# Patient Record
Sex: Male | Born: 1950
Health system: Southern US, Community
[De-identification: ages and names within clinical notes are randomized; demographics above are authoritative.]

## PROBLEM LIST (undated history)

## (undated) DIAGNOSIS — Z973 Presence of spectacles and contact lenses: Secondary | ICD-10-CM

## (undated) DIAGNOSIS — H9190 Unspecified hearing loss, unspecified ear: Secondary | ICD-10-CM

## (undated) DIAGNOSIS — J45909 Unspecified asthma, uncomplicated: Secondary | ICD-10-CM

## (undated) DIAGNOSIS — IMO0001 Reserved for inherently not codable concepts without codable children: Secondary | ICD-10-CM

## (undated) DIAGNOSIS — Z8719 Personal history of other diseases of the digestive system: Secondary | ICD-10-CM

## (undated) DIAGNOSIS — R351 Nocturia: Secondary | ICD-10-CM

## (undated) DIAGNOSIS — Z8546 Personal history of malignant neoplasm of prostate: Secondary | ICD-10-CM

## (undated) DIAGNOSIS — F329 Major depressive disorder, single episode, unspecified: Secondary | ICD-10-CM

## (undated) DIAGNOSIS — Z86010 Personal history of colonic polyps: Secondary | ICD-10-CM

## (undated) DIAGNOSIS — F4310 Post-traumatic stress disorder, unspecified: Secondary | ICD-10-CM

## (undated) DIAGNOSIS — K219 Gastro-esophageal reflux disease without esophagitis: Secondary | ICD-10-CM

## (undated) DIAGNOSIS — N400 Enlarged prostate without lower urinary tract symptoms: Secondary | ICD-10-CM

## (undated) DIAGNOSIS — Z860101 Personal history of adenomatous and serrated colon polyps: Secondary | ICD-10-CM

## (undated) DIAGNOSIS — Z77098 Contact with and (suspected) exposure to other hazardous, chiefly nonmedicinal, chemicals: Secondary | ICD-10-CM

## (undated) DIAGNOSIS — M1990 Unspecified osteoarthritis, unspecified site: Secondary | ICD-10-CM

## (undated) DIAGNOSIS — I480 Paroxysmal atrial fibrillation: Secondary | ICD-10-CM

## (undated) DIAGNOSIS — Z8711 Personal history of peptic ulcer disease: Secondary | ICD-10-CM

## (undated) DIAGNOSIS — E785 Hyperlipidemia, unspecified: Secondary | ICD-10-CM

## (undated) DIAGNOSIS — E119 Type 2 diabetes mellitus without complications: Secondary | ICD-10-CM

## (undated) DIAGNOSIS — Z9989 Dependence on other enabling machines and devices: Secondary | ICD-10-CM

## (undated) DIAGNOSIS — F32A Depression, unspecified: Secondary | ICD-10-CM

## (undated) DIAGNOSIS — G4733 Obstructive sleep apnea (adult) (pediatric): Secondary | ICD-10-CM

## (undated) DIAGNOSIS — I10 Essential (primary) hypertension: Secondary | ICD-10-CM

## (undated) DIAGNOSIS — G8929 Other chronic pain: Secondary | ICD-10-CM

## (undated) DIAGNOSIS — D649 Anemia, unspecified: Secondary | ICD-10-CM

## (undated) DIAGNOSIS — N529 Male erectile dysfunction, unspecified: Secondary | ICD-10-CM

## (undated) DIAGNOSIS — H5461 Unqualified visual loss, right eye, normal vision left eye: Secondary | ICD-10-CM

## (undated) DIAGNOSIS — E1142 Type 2 diabetes mellitus with diabetic polyneuropathy: Secondary | ICD-10-CM

## (undated) DIAGNOSIS — Z7739 Contact with and (suspected) exposure to other war theater: Secondary | ICD-10-CM

## (undated) DIAGNOSIS — N5089 Other specified disorders of the male genital organs: Secondary | ICD-10-CM

## (undated) DIAGNOSIS — Z87898 Personal history of other specified conditions: Secondary | ICD-10-CM

## (undated) DIAGNOSIS — I509 Heart failure, unspecified: Secondary | ICD-10-CM

## (undated) HISTORY — DX: Male erectile dysfunction, unspecified: N52.9

## (undated) HISTORY — DX: Essential (primary) hypertension: I10

## (undated) HISTORY — DX: Benign prostatic hyperplasia without lower urinary tract symptoms: N40.0

## (undated) HISTORY — PX: CATARACT EXTRACTION: SUR2

## (undated) HISTORY — DX: Post-traumatic stress disorder, unspecified: F43.10

## (undated) HISTORY — PX: OTHER SURGICAL HISTORY: SHX169

---

## 1985-07-26 HISTORY — PX: OTHER SURGICAL HISTORY: SHX169

## 1996-07-26 HISTORY — PX: TOTAL KNEE ARTHROPLASTY: SHX125

## 2000-08-09 ENCOUNTER — Encounter: Payer: Self-pay | Admitting: Internal Medicine

## 2002-05-26 ENCOUNTER — Emergency Department (HOSPITAL_COMMUNITY): Admission: EM | Admit: 2002-05-26 | Discharge: 2002-05-26 | Payer: Self-pay | Admitting: Emergency Medicine

## 2003-07-12 ENCOUNTER — Emergency Department (HOSPITAL_COMMUNITY): Admission: EM | Admit: 2003-07-12 | Discharge: 2003-07-12 | Payer: Self-pay

## 2003-11-06 ENCOUNTER — Emergency Department (HOSPITAL_COMMUNITY): Admission: EM | Admit: 2003-11-06 | Discharge: 2003-11-06 | Payer: Self-pay | Admitting: Emergency Medicine

## 2003-11-09 ENCOUNTER — Emergency Department (HOSPITAL_COMMUNITY): Admission: EM | Admit: 2003-11-09 | Discharge: 2003-11-09 | Payer: Self-pay | Admitting: Emergency Medicine

## 2004-06-02 ENCOUNTER — Emergency Department (HOSPITAL_COMMUNITY): Admission: EM | Admit: 2004-06-02 | Discharge: 2004-06-02 | Payer: Self-pay | Admitting: Emergency Medicine

## 2004-06-06 ENCOUNTER — Emergency Department (HOSPITAL_COMMUNITY): Admission: EM | Admit: 2004-06-06 | Discharge: 2004-06-06 | Payer: Self-pay | Admitting: Emergency Medicine

## 2004-06-23 ENCOUNTER — Emergency Department (HOSPITAL_COMMUNITY): Admission: EM | Admit: 2004-06-23 | Discharge: 2004-06-23 | Payer: Self-pay

## 2004-07-26 HISTORY — PX: RETINAL DETACHMENT SURGERY: SHX105

## 2004-10-15 ENCOUNTER — Emergency Department (HOSPITAL_COMMUNITY): Admission: EM | Admit: 2004-10-15 | Discharge: 2004-10-15 | Payer: Self-pay | Admitting: *Deleted

## 2005-08-11 ENCOUNTER — Emergency Department (HOSPITAL_COMMUNITY): Admission: EM | Admit: 2005-08-11 | Discharge: 2005-08-11 | Payer: Self-pay | Admitting: Emergency Medicine

## 2005-11-05 ENCOUNTER — Encounter (INDEPENDENT_AMBULATORY_CARE_PROVIDER_SITE_OTHER): Payer: Self-pay | Admitting: Specialist

## 2005-11-05 HISTORY — PX: TRANSURETHRAL RESECTION OF PROSTATE: SHX73

## 2005-11-06 ENCOUNTER — Inpatient Hospital Stay (HOSPITAL_COMMUNITY): Admission: RE | Admit: 2005-11-06 | Discharge: 2005-11-07 | Payer: Self-pay | Admitting: Urology

## 2007-03-13 ENCOUNTER — Encounter: Payer: Self-pay | Admitting: Internal Medicine

## 2007-03-28 ENCOUNTER — Encounter: Payer: Self-pay | Admitting: Internal Medicine

## 2007-04-05 ENCOUNTER — Encounter: Payer: Self-pay | Admitting: Internal Medicine

## 2007-04-07 ENCOUNTER — Encounter: Payer: Self-pay | Admitting: Internal Medicine

## 2007-08-31 ENCOUNTER — Ambulatory Visit: Admission: RE | Admit: 2007-08-31 | Discharge: 2007-11-23 | Payer: Self-pay | Admitting: Radiation Oncology

## 2007-11-02 LAB — URINALYSIS, MICROSCOPIC - CHCC
Bilirubin (Urine): NEGATIVE
Glucose: NEGATIVE g/dL
Ketones: NEGATIVE mg/dL
Leukocyte Esterase: NEGATIVE
Nitrite: NEGATIVE
Protein: 30 mg/dL
Specific Gravity, Urine: 1.015 (ref 1.003–1.035)
WBC, UA: NEGATIVE (ref 0–2)
pH: 6.5 (ref 4.6–8.0)

## 2007-11-04 LAB — URINE CULTURE

## 2007-11-23 ENCOUNTER — Ambulatory Visit: Admission: RE | Admit: 2007-11-23 | Discharge: 2007-12-19 | Payer: Self-pay | Admitting: Radiation Oncology

## 2008-03-12 ENCOUNTER — Ambulatory Visit: Payer: Self-pay | Admitting: Internal Medicine

## 2008-03-12 DIAGNOSIS — R209 Unspecified disturbances of skin sensation: Secondary | ICD-10-CM | POA: Insufficient documentation

## 2008-03-12 DIAGNOSIS — Z8546 Personal history of malignant neoplasm of prostate: Secondary | ICD-10-CM | POA: Insufficient documentation

## 2008-03-12 DIAGNOSIS — C61 Malignant neoplasm of prostate: Secondary | ICD-10-CM | POA: Insufficient documentation

## 2008-03-12 DIAGNOSIS — F438 Other reactions to severe stress: Secondary | ICD-10-CM | POA: Insufficient documentation

## 2008-03-12 DIAGNOSIS — R413 Other amnesia: Secondary | ICD-10-CM | POA: Insufficient documentation

## 2008-03-13 ENCOUNTER — Telehealth: Payer: Self-pay | Admitting: Internal Medicine

## 2008-03-13 ENCOUNTER — Ambulatory Visit: Payer: Self-pay | Admitting: Internal Medicine

## 2008-03-13 LAB — CONVERTED CEMR LAB
ALT: 23 units/L (ref 0–53)
AST: 21 units/L (ref 0–37)
Albumin: 3.8 g/dL (ref 3.5–5.2)
Alkaline Phosphatase: 54 units/L (ref 39–117)
BUN: 11 mg/dL (ref 6–23)
Basophils Absolute: 0 10*3/uL (ref 0.0–0.1)
Basophils Relative: 0.2 % (ref 0.0–3.0)
Bilirubin Urine: NEGATIVE
Bilirubin, Direct: 0.1 mg/dL (ref 0.0–0.3)
CO2: 32 meq/L (ref 19–32)
Calcium: 9.1 mg/dL (ref 8.4–10.5)
Chloride: 107 meq/L (ref 96–112)
Cholesterol: 182 mg/dL (ref 0–200)
Creatinine, Ser: 0.9 mg/dL (ref 0.4–1.5)
Eosinophils Absolute: 0.2 10*3/uL (ref 0.0–0.7)
Eosinophils Relative: 3.9 % (ref 0.0–5.0)
Folate: 8.4 ng/mL
GFR calc Af Amer: 111 mL/min
GFR calc non Af Amer: 92 mL/min
Glucose, Bld: 99 mg/dL (ref 70–99)
HCT: 41.1 % (ref 39.0–52.0)
HDL: 41.4 mg/dL (ref >39.0)
Hemoglobin: 13.6 g/dL (ref 13.0–17.0)
Hgb A1c MFr Bld: 6.5 % — ABNORMAL HIGH (ref 4.6–6.0)
Ketones, ur: NEGATIVE mg/dL
LDL Cholesterol: 117 mg/dL — ABNORMAL HIGH (ref 0–99)
Leukocytes, UA: NEGATIVE
Lymphocytes Relative: 13.6 % (ref 12.0–46.0)
MCHC: 33.2 g/dL (ref 30.0–36.0)
MCV: 83.7 fL (ref 78.0–100.0)
Monocytes Absolute: 0.4 10*3/uL (ref 0.1–1.0)
Monocytes Relative: 8.9 % (ref 3.0–12.0)
Neutro Abs: 2.9 10*3/uL (ref 1.4–7.7)
Neutrophils Relative %: 73.4 % (ref 43.0–77.0)
Nitrite: NEGATIVE
Platelets: 270 10*3/uL (ref 150–400)
Potassium: 4 meq/L (ref 3.5–5.1)
RBC: 4.91 M/uL (ref 4.22–5.81)
RDW: 12.6 % (ref 11.5–14.6)
Sodium: 144 meq/L (ref 135–145)
Specific Gravity, Urine: 1.02 (ref 1.000–1.03)
TSH: 2.59 microintl units/mL (ref 0.35–5.50)
Total Bilirubin: 0.4 mg/dL (ref 0.3–1.2)
Total CHOL/HDL Ratio: 4.4
Total Protein, Urine: NEGATIVE mg/dL
Total Protein: 6.8 g/dL (ref 6.0–8.3)
Triglycerides: 117 mg/dL (ref 0–149)
Urine Glucose: NEGATIVE mg/dL
Urobilinogen, UA: 0.2 (ref 0.0–1.0)
VLDL: 23 mg/dL (ref 0–40)
Vitamin B-12: 438 pg/mL (ref 211–911)
WBC: 4.1 10*3/uL — ABNORMAL LOW (ref 4.5–10.5)
pH: 7.5 (ref 5.0–8.0)

## 2008-03-21 ENCOUNTER — Encounter: Payer: Self-pay | Admitting: Internal Medicine

## 2008-03-21 LAB — HM DIABETES EYE EXAM: HM Diabetic Eye Exam: NORMAL

## 2008-03-22 ENCOUNTER — Ambulatory Visit: Payer: Self-pay | Admitting: Internal Medicine

## 2008-03-22 DIAGNOSIS — E785 Hyperlipidemia, unspecified: Secondary | ICD-10-CM | POA: Insufficient documentation

## 2008-03-22 DIAGNOSIS — M545 Low back pain: Secondary | ICD-10-CM | POA: Insufficient documentation

## 2008-03-26 ENCOUNTER — Encounter: Admission: RE | Admit: 2008-03-26 | Discharge: 2008-03-26 | Payer: Self-pay | Admitting: Internal Medicine

## 2008-03-28 ENCOUNTER — Telehealth: Payer: Self-pay | Admitting: Internal Medicine

## 2008-04-04 ENCOUNTER — Ambulatory Visit: Payer: Self-pay | Admitting: Internal Medicine

## 2008-04-15 ENCOUNTER — Encounter: Payer: Self-pay | Admitting: Internal Medicine

## 2008-04-23 ENCOUNTER — Ambulatory Visit: Payer: Self-pay | Admitting: Internal Medicine

## 2008-04-23 LAB — CONVERTED CEMR LAB
ALT: 20 units/L (ref 0–53)
AST: 19 units/L (ref 0–37)
Cholesterol: 201 mg/dL (ref 0–200)
Direct LDL: 133.1 mg/dL
HDL: 41.4 mg/dL (ref >39.0)
Total CHOL/HDL Ratio: 4.9
Triglycerides: 122 mg/dL (ref 0–149)
VLDL: 24 mg/dL (ref 0–40)

## 2008-04-26 ENCOUNTER — Ambulatory Visit: Payer: Self-pay | Admitting: Internal Medicine

## 2008-05-01 ENCOUNTER — Ambulatory Visit: Payer: Self-pay | Admitting: Physical Medicine & Rehabilitation

## 2008-05-28 ENCOUNTER — Ambulatory Visit: Payer: Self-pay | Admitting: Internal Medicine

## 2008-05-28 DIAGNOSIS — E119 Type 2 diabetes mellitus without complications: Secondary | ICD-10-CM | POA: Insufficient documentation

## 2008-05-29 ENCOUNTER — Ambulatory Visit: Payer: Self-pay | Admitting: Physical Medicine & Rehabilitation

## 2008-05-29 ENCOUNTER — Encounter: Payer: Self-pay | Admitting: Internal Medicine

## 2008-07-12 ENCOUNTER — Ambulatory Visit: Payer: Self-pay | Admitting: Internal Medicine

## 2008-07-12 ENCOUNTER — Ambulatory Visit: Payer: Self-pay | Admitting: Diagnostic Radiology

## 2008-07-12 ENCOUNTER — Ambulatory Visit (HOSPITAL_BASED_OUTPATIENT_CLINIC_OR_DEPARTMENT_OTHER): Admission: RE | Admit: 2008-07-12 | Discharge: 2008-07-12 | Payer: Self-pay | Admitting: Internal Medicine

## 2008-07-12 DIAGNOSIS — M25519 Pain in unspecified shoulder: Secondary | ICD-10-CM | POA: Insufficient documentation

## 2008-07-22 ENCOUNTER — Ambulatory Visit: Payer: Self-pay | Admitting: Internal Medicine

## 2008-07-22 LAB — CONVERTED CEMR LAB
ALT: 21 units/L (ref 0–53)
AST: 19 units/L (ref 0–37)
Cholesterol: 190 mg/dL (ref 0–200)
Creatinine,U: 151.1 mg/dL
HDL: 46.7 mg/dL (ref >39.0)
Hgb A1c MFr Bld: 6.5 % — ABNORMAL HIGH (ref 4.6–6.0)
LDL Cholesterol: 119 mg/dL — ABNORMAL HIGH (ref 0–99)
Microalb Creat Ratio: 33.1 mg/g — ABNORMAL HIGH (ref 0.0–30.0)
Microalb, Ur: 5 mg/dL — ABNORMAL HIGH (ref 0.0–1.9)
Total CHOL/HDL Ratio: 4.1
Triglycerides: 122 mg/dL (ref 0–149)
VLDL: 24 mg/dL (ref 0–40)

## 2008-09-03 ENCOUNTER — Encounter: Payer: Self-pay | Admitting: Internal Medicine

## 2008-09-17 ENCOUNTER — Ambulatory Visit: Payer: Self-pay | Admitting: Internal Medicine

## 2009-01-06 ENCOUNTER — Ambulatory Visit: Payer: Self-pay | Admitting: Internal Medicine

## 2009-01-06 LAB — CONVERTED CEMR LAB
ALT: 23 units/L (ref 0–53)
AST: 21 units/L (ref 0–37)
BUN: 17 mg/dL (ref 6–23)
CO2: 27 meq/L (ref 19–32)
Calcium: 8.6 mg/dL (ref 8.4–10.5)
Chloride: 106 meq/L (ref 96–112)
Cholesterol: 178 mg/dL (ref 0–200)
Creatinine, Ser: 0.9 mg/dL (ref 0.4–1.5)
Creatinine,U: 245.6 mg/dL
GFR calc non Af Amer: 110.7 mL/min (ref >60)
Glucose, Bld: 92 mg/dL (ref 70–99)
HDL: 43.4 mg/dL (ref >39.00)
Hgb A1c MFr Bld: 6.5 % (ref 4.6–6.5)
LDL Cholesterol: 117 mg/dL — ABNORMAL HIGH (ref 0–99)
Microalb Creat Ratio: 45.6 mg/g — ABNORMAL HIGH (ref 0.0–30.0)
Microalb, Ur: 11.2 mg/dL — ABNORMAL HIGH (ref 0.0–1.9)
Potassium: 3.6 meq/L (ref 3.5–5.1)
Sodium: 136 meq/L (ref 135–145)
TSH: 2.95 microintl units/mL (ref 0.35–5.50)
Total CHOL/HDL Ratio: 4
Triglycerides: 88 mg/dL (ref 0.0–149.0)
VLDL: 17.6 mg/dL (ref 0.0–40.0)

## 2009-01-16 ENCOUNTER — Ambulatory Visit: Payer: Self-pay | Admitting: Internal Medicine

## 2009-01-16 DIAGNOSIS — R404 Transient alteration of awareness: Secondary | ICD-10-CM | POA: Insufficient documentation

## 2009-01-16 DIAGNOSIS — G4733 Obstructive sleep apnea (adult) (pediatric): Secondary | ICD-10-CM | POA: Insufficient documentation

## 2009-01-16 DIAGNOSIS — I10 Essential (primary) hypertension: Secondary | ICD-10-CM | POA: Insufficient documentation

## 2009-02-06 ENCOUNTER — Encounter: Payer: Self-pay | Admitting: Internal Medicine

## 2009-02-14 ENCOUNTER — Emergency Department (HOSPITAL_COMMUNITY): Admission: EM | Admit: 2009-02-14 | Discharge: 2009-02-14 | Payer: Self-pay | Admitting: Emergency Medicine

## 2009-02-19 ENCOUNTER — Ambulatory Visit: Payer: Self-pay | Admitting: Internal Medicine

## 2009-02-19 DIAGNOSIS — M542 Cervicalgia: Secondary | ICD-10-CM | POA: Insufficient documentation

## 2009-03-03 ENCOUNTER — Encounter: Admission: RE | Admit: 2009-03-03 | Discharge: 2009-03-03 | Payer: Self-pay | Admitting: Orthopaedic Surgery

## 2009-03-06 ENCOUNTER — Ambulatory Visit: Payer: Self-pay | Admitting: Internal Medicine

## 2009-03-20 ENCOUNTER — Encounter: Payer: Self-pay | Admitting: Internal Medicine

## 2009-03-27 ENCOUNTER — Ambulatory Visit: Payer: Self-pay | Admitting: Internal Medicine

## 2009-04-22 ENCOUNTER — Encounter: Payer: Self-pay | Admitting: Internal Medicine

## 2009-04-22 LAB — HM COLONOSCOPY

## 2009-04-23 ENCOUNTER — Encounter: Payer: Self-pay | Admitting: Internal Medicine

## 2009-05-14 ENCOUNTER — Ambulatory Visit: Payer: Self-pay | Admitting: Internal Medicine

## 2009-06-26 ENCOUNTER — Encounter: Payer: Self-pay | Admitting: Internal Medicine

## 2009-07-01 ENCOUNTER — Encounter: Payer: Self-pay | Admitting: Internal Medicine

## 2009-07-01 ENCOUNTER — Ambulatory Visit (HOSPITAL_COMMUNITY): Admission: RE | Admit: 2009-07-01 | Discharge: 2009-07-01 | Payer: Self-pay | Admitting: Orthopaedic Surgery

## 2009-07-01 HISTORY — PX: OTHER SURGICAL HISTORY: SHX169

## 2009-07-21 ENCOUNTER — Encounter: Payer: Self-pay | Admitting: Internal Medicine

## 2009-07-28 ENCOUNTER — Ambulatory Visit: Payer: Self-pay | Admitting: Internal Medicine

## 2009-07-28 DIAGNOSIS — K649 Unspecified hemorrhoids: Secondary | ICD-10-CM | POA: Insufficient documentation

## 2009-07-28 LAB — CONVERTED CEMR LAB
ALT: 18 U/L
AST: 18 U/L
Albumin: 4 g/dL
Alkaline Phosphatase: 57 U/L
BUN: 14 mg/dL
Basophils Absolute: 0 K/uL
Basophils Relative: 0 %
Bilirubin, Direct: 0.1 mg/dL
CO2: 24 meq/L
Calcium: 8.8 mg/dL
Chloride: 106 meq/L
Cholesterol: 155 mg/dL
Creatinine, Ser: 0.86 mg/dL
Creatinine, Urine: 307.9 mg/dL
Eosinophils Absolute: 0.5 K/uL
Eosinophils Relative: 8 % — ABNORMAL HIGH
Glucose, Bld: 95 mg/dL
HCT: 23.7 % — ABNORMAL LOW
HDL: 42 mg/dL
Hemoglobin: 6.8 g/dL — CL
Hgb A1c MFr Bld: 4.4 % — ABNORMAL LOW
Indirect Bilirubin: 0.1 mg/dL
LDL Cholesterol: 65 mg/dL
Lymphocytes Relative: 16 %
Lymphs Abs: 1 K/uL
MCHC: 28.7 g/dL — ABNORMAL LOW
MCV: 78.5 fL
Microalb Creat Ratio: 52.2 mg/g — ABNORMAL HIGH
Microalb, Ur: 16.08 mg/dL — ABNORMAL HIGH
Monocytes Absolute: 0.6 K/uL
Monocytes Relative: 10 %
Neutro Abs: 4.1 K/uL
Neutrophils Relative %: 66 %
Platelets: 413 K/uL — ABNORMAL HIGH
Potassium: 4.2 meq/L
RBC: 3.02 M/uL — ABNORMAL LOW
RDW: 15.3 %
Sodium: 142 meq/L
Total Bilirubin: 0.2 mg/dL — ABNORMAL LOW
Total CHOL/HDL Ratio: 3.7
Total Protein: 6.5 g/dL
Triglycerides: 241 mg/dL — ABNORMAL HIGH
VLDL: 48 mg/dL — ABNORMAL HIGH
WBC: 6.3 10*3/microliter

## 2009-07-28 LAB — HM DIABETES FOOT EXAM

## 2009-07-29 ENCOUNTER — Inpatient Hospital Stay (HOSPITAL_COMMUNITY): Admission: EM | Admit: 2009-07-29 | Discharge: 2009-08-01 | Payer: Self-pay | Admitting: Emergency Medicine

## 2009-07-29 ENCOUNTER — Telehealth: Payer: Self-pay | Admitting: Internal Medicine

## 2009-07-29 ENCOUNTER — Ambulatory Visit: Payer: Self-pay | Admitting: Internal Medicine

## 2009-07-29 ENCOUNTER — Encounter: Payer: Self-pay | Admitting: Internal Medicine

## 2009-07-30 ENCOUNTER — Encounter: Payer: Self-pay | Admitting: Internal Medicine

## 2009-08-08 ENCOUNTER — Encounter: Payer: Self-pay | Admitting: Internal Medicine

## 2009-08-21 ENCOUNTER — Ambulatory Visit: Payer: Self-pay | Admitting: Internal Medicine

## 2009-08-21 DIAGNOSIS — D500 Iron deficiency anemia secondary to blood loss (chronic): Secondary | ICD-10-CM | POA: Insufficient documentation

## 2009-08-21 LAB — CONVERTED CEMR LAB
Basophils Absolute: 0 10*3/uL (ref 0.0–0.1)
Basophils Relative: 0.2 % (ref 0.0–3.0)
Eosinophils Absolute: 0.2 10*3/uL (ref 0.0–0.7)
Eosinophils Relative: 3.8 % (ref 0.0–5.0)
HCT: 34.1 % — ABNORMAL LOW (ref 39.0–52.0)
Hemoglobin: 10.7 g/dL — ABNORMAL LOW (ref 13.0–17.0)
Lymphocytes Relative: 16 % (ref 12.0–46.0)
Lymphs Abs: 0.9 10*3/uL (ref 0.7–4.0)
MCHC: 31.3 g/dL (ref 30.0–36.0)
MCV: 83.9 fL (ref 78.0–100.0)
Monocytes Absolute: 0.5 10*3/uL (ref 0.1–1.0)
Monocytes Relative: 8.4 % (ref 3.0–12.0)
Neutro Abs: 3.8 10*3/uL (ref 1.4–7.7)
Neutrophils Relative %: 71.6 % (ref 43.0–77.0)
Platelets: 404 10*3/uL — ABNORMAL HIGH (ref 150.0–400.0)
RBC: 4.07 M/uL — ABNORMAL LOW (ref 4.22–5.81)
RDW: 19.7 % — ABNORMAL HIGH (ref 11.5–14.6)
WBC: 5.4 10*3/uL (ref 4.5–10.5)

## 2009-08-28 ENCOUNTER — Ambulatory Visit: Payer: Self-pay | Admitting: Internal Medicine

## 2009-08-28 DIAGNOSIS — K5900 Constipation, unspecified: Secondary | ICD-10-CM | POA: Insufficient documentation

## 2009-09-05 ENCOUNTER — Telehealth: Payer: Self-pay | Admitting: Internal Medicine

## 2009-09-08 ENCOUNTER — Telehealth: Payer: Self-pay | Admitting: Internal Medicine

## 2009-09-08 ENCOUNTER — Encounter: Payer: Self-pay | Admitting: Internal Medicine

## 2009-09-09 ENCOUNTER — Telehealth: Payer: Self-pay | Admitting: Internal Medicine

## 2009-09-15 ENCOUNTER — Ambulatory Visit: Payer: Self-pay | Admitting: Internal Medicine

## 2009-09-19 LAB — CONVERTED CEMR LAB
Basophils Absolute: 0 10*3/uL (ref 0.0–0.1)
Basophils Relative: 0.1 % (ref 0.0–3.0)
Eosinophils Absolute: 0.2 10*3/uL (ref 0.0–0.7)
Eosinophils Relative: 3.4 % (ref 0.0–5.0)
HCT: 38.3 % — ABNORMAL LOW (ref 39.0–52.0)
Hemoglobin: 12.2 g/dL — ABNORMAL LOW (ref 13.0–17.0)
Lymphocytes Relative: 13.3 % (ref 12.0–46.0)
Lymphs Abs: 0.8 10*3/uL (ref 0.7–4.0)
MCHC: 31.9 g/dL (ref 30.0–36.0)
MCV: 83.5 fL (ref 78.0–100.0)
Monocytes Absolute: 0.4 10*3/uL (ref 0.1–1.0)
Monocytes Relative: 6.7 % (ref 3.0–12.0)
Neutro Abs: 4.6 10*3/uL (ref 1.4–7.7)
Neutrophils Relative %: 76.5 % (ref 43.0–77.0)
Platelets: 252 10*3/uL (ref 150.0–400.0)
RBC: 4.59 M/uL (ref 4.22–5.81)
RDW: 17.6 % — ABNORMAL HIGH (ref 11.5–14.6)
WBC: 6 10*3/uL (ref 4.5–10.5)

## 2009-09-29 ENCOUNTER — Ambulatory Visit (HOSPITAL_BASED_OUTPATIENT_CLINIC_OR_DEPARTMENT_OTHER): Admission: RE | Admit: 2009-09-29 | Discharge: 2009-09-29 | Payer: Self-pay | Admitting: Orthopaedic Surgery

## 2009-09-29 HISTORY — PX: OTHER SURGICAL HISTORY: SHX169

## 2009-10-28 ENCOUNTER — Encounter (INDEPENDENT_AMBULATORY_CARE_PROVIDER_SITE_OTHER): Payer: Self-pay | Admitting: *Deleted

## 2009-10-28 ENCOUNTER — Ambulatory Visit: Payer: Self-pay | Admitting: Internal Medicine

## 2009-10-29 LAB — CONVERTED CEMR LAB
Basophils Absolute: 0 10*3/uL (ref 0.0–0.1)
Basophils Relative: 0.3 % (ref 0.0–3.0)
Eosinophils Absolute: 0.2 10*3/uL (ref 0.0–0.7)
Eosinophils Relative: 2.6 % (ref 0.0–5.0)
HCT: 41.3 % (ref 39.0–52.0)
Hemoglobin: 13.6 g/dL (ref 13.0–17.0)
Lymphocytes Relative: 11.4 % — ABNORMAL LOW (ref 12.0–46.0)
Lymphs Abs: 0.9 10*3/uL (ref 0.7–4.0)
MCHC: 33 g/dL (ref 30.0–36.0)
MCV: 79.6 fL (ref 78.0–100.0)
Monocytes Absolute: 0.7 10*3/uL (ref 0.1–1.0)
Monocytes Relative: 8.4 % (ref 3.0–12.0)
Neutro Abs: 6.2 10*3/uL (ref 1.4–7.7)
Neutrophils Relative %: 77.3 % — ABNORMAL HIGH (ref 43.0–77.0)
Platelets: 265 10*3/uL (ref 150.0–400.0)
RBC: 5.19 M/uL (ref 4.22–5.81)
RDW: 17.6 % — ABNORMAL HIGH (ref 11.5–14.6)
WBC: 8 10*3/uL (ref 4.5–10.5)

## 2009-11-04 ENCOUNTER — Ambulatory Visit: Payer: Self-pay | Admitting: Internal Medicine

## 2009-11-11 ENCOUNTER — Ambulatory Visit: Payer: Self-pay | Admitting: Internal Medicine

## 2009-12-09 ENCOUNTER — Encounter: Payer: Self-pay | Admitting: Internal Medicine

## 2009-12-25 ENCOUNTER — Ambulatory Visit (HOSPITAL_COMMUNITY): Admission: RE | Admit: 2009-12-25 | Discharge: 2009-12-26 | Payer: Self-pay | Admitting: General Surgery

## 2009-12-25 ENCOUNTER — Encounter (INDEPENDENT_AMBULATORY_CARE_PROVIDER_SITE_OTHER): Payer: Self-pay | Admitting: General Surgery

## 2009-12-25 HISTORY — PX: HEMORROIDECTOMY: SUR656

## 2010-01-12 ENCOUNTER — Ambulatory Visit: Payer: Self-pay | Admitting: Internal Medicine

## 2010-01-12 LAB — CONVERTED CEMR LAB
BUN: 20 mg/dL (ref 6–23)
Basophils Absolute: 0 10*3/uL (ref 0.0–0.1)
Basophils Relative: 1 % (ref 0–1)
CO2: 25 meq/L (ref 19–32)
Calcium: 9.3 mg/dL (ref 8.4–10.5)
Chloride: 106 meq/L (ref 96–112)
Creatinine, Ser: 0.82 mg/dL (ref 0.40–1.50)
Eosinophils Absolute: 0.2 10*3/uL (ref 0.0–0.7)
Eosinophils Relative: 4 % (ref 0–5)
Glucose, Bld: 94 mg/dL (ref 70–99)
HCT: 41.3 % (ref 39.0–52.0)
Hemoglobin: 13.4 g/dL (ref 13.0–17.0)
Lymphocytes Relative: 16 % (ref 12–46)
Lymphs Abs: 1 10*3/uL (ref 0.7–4.0)
MCHC: 32.4 g/dL (ref 30.0–36.0)
MCV: 82.9 fL (ref 78.0–100.0)
Monocytes Absolute: 0.5 10*3/uL (ref 0.1–1.0)
Monocytes Relative: 8 % (ref 3–12)
Neutro Abs: 4.5 10*3/uL (ref 1.7–7.7)
Neutrophils Relative %: 72 % (ref 43–77)
Platelets: 292 10*3/uL (ref 150–400)
Potassium: 4.2 meq/L (ref 3.5–5.3)
RBC: 4.98 M/uL (ref 4.22–5.81)
RDW: 15.9 % — ABNORMAL HIGH (ref 11.5–15.5)
Sodium: 142 meq/L (ref 135–145)
WBC: 6.2 10*3/uL (ref 4.0–10.5)

## 2010-01-19 ENCOUNTER — Telehealth: Payer: Self-pay | Admitting: Internal Medicine

## 2010-04-02 ENCOUNTER — Ambulatory Visit: Payer: Self-pay | Admitting: Internal Medicine

## 2010-04-02 DIAGNOSIS — M25579 Pain in unspecified ankle and joints of unspecified foot: Secondary | ICD-10-CM | POA: Insufficient documentation

## 2010-04-02 LAB — CONVERTED CEMR LAB
BUN: 15 mg/dL (ref 6–23)
CO2: 30 meq/L (ref 19–32)
Calcium: 9 mg/dL (ref 8.4–10.5)
Chloride: 104 meq/L (ref 96–112)
Creatinine, Ser: 0.76 mg/dL (ref 0.40–1.50)
Creatinine, Urine: 126.6 mg/dL
Glucose, Bld: 76 mg/dL (ref 70–99)
Hgb A1c MFr Bld: 6.4 % — ABNORMAL HIGH (ref <5.7)
Microalb Creat Ratio: 19.9 mg/g (ref 0.0–30.0)
Microalb, Ur: 2.52 mg/dL — ABNORMAL HIGH (ref 0.00–1.89)
Potassium: 4.4 meq/L (ref 3.5–5.3)
Sodium: 141 meq/L (ref 135–145)
Uric Acid, Serum: 4.5 mg/dL (ref 4.0–7.8)

## 2010-04-03 ENCOUNTER — Encounter: Payer: Self-pay | Admitting: Internal Medicine

## 2010-06-30 ENCOUNTER — Ambulatory Visit: Payer: Self-pay | Admitting: Internal Medicine

## 2010-06-30 DIAGNOSIS — R5383 Other fatigue: Secondary | ICD-10-CM

## 2010-06-30 DIAGNOSIS — R5381 Other malaise: Secondary | ICD-10-CM | POA: Insufficient documentation

## 2010-06-30 LAB — CONVERTED CEMR LAB
ALT: 18 units/L (ref 0–53)
AST: 15 units/L (ref 0–37)
Albumin: 4.1 g/dL (ref 3.5–5.2)
Alkaline Phosphatase: 66 units/L (ref 39–117)
BUN: 11 mg/dL (ref 6–23)
Basophils Absolute: 0 10*3/uL (ref 0.0–0.1)
Basophils Relative: 1 % (ref 0–1)
Bilirubin, Direct: 0.1 mg/dL (ref 0.0–0.3)
CO2: 28 meq/L (ref 19–32)
Calcium: 9.3 mg/dL (ref 8.4–10.5)
Chloride: 105 meq/L (ref 96–112)
Creatinine, Ser: 0.73 mg/dL (ref 0.40–1.50)
Creatinine, Urine: 177.3 mg/dL
Eosinophils Absolute: 0.1 10*3/uL (ref 0.0–0.7)
Eosinophils Relative: 3 % (ref 0–5)
Glucose, Bld: 72 mg/dL (ref 70–99)
HCT: 43.3 % (ref 39.0–52.0)
Hemoglobin: 13.9 g/dL (ref 13.0–17.0)
Hgb A1c MFr Bld: 6.4 % — ABNORMAL HIGH (ref <5.7)
Indirect Bilirubin: 0.2 mg/dL (ref 0.0–0.9)
Lymphocytes Relative: 19 % (ref 12–46)
Lymphs Abs: 0.9 10*3/uL (ref 0.7–4.0)
MCHC: 32.1 g/dL (ref 30.0–36.0)
MCV: 83.4 fL (ref 78.0–100.0)
Microalb Creat Ratio: 28.8 mg/g (ref 0.0–30.0)
Microalb, Ur: 5.11 mg/dL — ABNORMAL HIGH (ref 0.00–1.89)
Monocytes Absolute: 0.4 10*3/uL (ref 0.1–1.0)
Monocytes Relative: 8 % (ref 3–12)
Neutro Abs: 3.5 10*3/uL (ref 1.7–7.7)
Neutrophils Relative %: 70 % (ref 43–77)
PSA: 0.63 ng/mL (ref <=4.00)
Platelets: 259 10*3/uL (ref 150–400)
Potassium: 4.3 meq/L (ref 3.5–5.3)
RBC: 5.19 M/uL (ref 4.22–5.81)
RDW: 14.4 % (ref 11.5–15.5)
Sodium: 143 meq/L (ref 135–145)
TSH: 2.854 microintl units/mL (ref 0.350–4.500)
Total Bilirubin: 0.3 mg/dL (ref 0.3–1.2)
Total CK: 232 units/L (ref 7–232)
Total Protein: 6.6 g/dL (ref 6.0–8.3)
Vitamin B-12: 944 pg/mL — ABNORMAL HIGH (ref 211–911)
WBC: 5 10*3/uL (ref 4.0–10.5)

## 2010-07-01 ENCOUNTER — Telehealth: Payer: Self-pay | Admitting: Internal Medicine

## 2010-07-01 ENCOUNTER — Encounter: Payer: Self-pay | Admitting: Internal Medicine

## 2010-08-25 NOTE — Assessment & Plan Note (Signed)
Summary: 1 month follow up/mhf   Vital Signs:  Patient profile:   60 year old male Weight:      313 pounds BMI:     39.79 O2 Sat:      100 % on Room air Temp:     97.4 degrees F oral Pulse rate:   87 / minute Pulse rhythm:   regular Resp:     18 per minute BP sitting:   140 / 70  (left arm) Cuff size:   large  Vitals Entered By: Glendell Docker CMA (August 28, 2009 2:20 PM)  O2 Flow:  Room air  Primary Care Provider:  D. Thomos Lemons DO  CC:  Constipation.  History of Present Illness:       This is a 60 year old male who presents with Constipation.  The patient reports hemorrhoids, but denies melena.  Risk factors for constipation includ narcotic medication.  Effective treatments have included stool softeners.    DM II - stable  Allergies: No Known Drug Allergies  Past History:  Past Medical History: Posttraumatic stress disorder History of depression and suicide attempts History of asthma Prostate cancer S/P radioactive seed tx  (Dr. Brunilda Payor) History of colon polyps (per patient) Urinary incontinence    Erectile dysfunction   Benign prostatic hypertrophy  Right retinal detachment    Diabetes Mellitus Type II     Hypertension Bleeding hemorrhoids Iron-deficiency Anemia  Past Surgical History: Right knee replacement 1988  previous left knee surgery          Right shoulder arthroscopic surgery - 06-2009   Family History: Diabetes - brother         No FH of Colon Cancer:    Social History: Retired  Married  Never Smoked Alcohol use-no   Drug use-no            Daily Caffeine Use-1 cup daily  Physical Exam  General:  alert and overweight-appearing.   Lungs:  normal respiratory effort and normal breath sounds.   Heart:  normal rate, regular rhythm, no murmur, and no gallop.   Abdomen:  soft, non-tender, and no masses.   Extremities:  trace left pedal edema and trace right pedal edema.     Impression & Recommendations:  Problem # 1:  CONSTIPATION  (ICD-564.00) Pt with recent hosp for rectal bleeding having persistent constipation.   symptoms exac by narcotic use.   start lactulose  His updated medication list for this problem includes:    Colace 100 Mg Caps (Docusate sodium) ..... One by mouth bid    Lactulose 10 Gm/75ml Soln (Lactulose) .Marland KitchenMarland KitchenMarland KitchenMarland Kitchen 10 gm by mouth two times a day as needed for constipation  Problem # 2:  DIABETES MELLITUS, TYPE II (ICD-250.00) A1c unreliable due to recent blood transfusion.  check fructosamine at next OV His updated medication list for this problem includes:    Metformin Hcl 750 Mg Xr24h-tab (Metformin hcl) ..... One by mouth two times a day    Lisinopril 20 Mg Tabs (Lisinopril) .Marland Kitchen... Take 1 tablet by mouth once a day  Labs Reviewed: Creat: 0.86 (07/28/2009)     Last Eye Exam: normal (03/21/2008) Reviewed HgBA1c results: 4.4 (07/28/2009)  6.5 (01/06/2009)  Complete Medication List: 1)  Ditropan Xl 5 Mg Xr24h-tab (Oxybutynin chloride) .... Take 1 tablet by mouth two times a day 2)  Gabapentin 400 Mg Caps (Gabapentin) .... Take 1 tablet by mouth tid 3)  Seroquel 300 Mg Tabs (Quetiapine fumarate) .... Take 1 tab by mouth  at bedtime as needed 4)  Tylenol 325 Mg Tabs (Acetaminophen) .... Take 1 tablet by mouth once a day as needed 5)  Trazodone Hcl 100 Mg Tabs (Trazodone hcl) .... Take 1 tab by mouth at bedtime 6)  Metformin Hcl 750 Mg Xr24h-tab (Metformin hcl) .... One by mouth two times a day 7)  Simvastatin 40 Mg Tabs (Simvastatin) .... One by mouth at bedtime 8)  Lisinopril 20 Mg Tabs (Lisinopril) .... Take 1 tablet by mouth once a day 9)  Proair Hfa 108 (90 Base) Mcg/act Aers (Albuterol sulfate) .... 2 puffs by mouth two times a day as needed 10)  Asmanex 14 Metered Doses 220 Mcg/inh Aepb (Mometasone furoate) .Marland Kitchen.. 1 puff by mouth once daily as needed 11)  Oxycodone-acetaminophen 10-325 Mg Tabs (Oxycodone-acetaminophen) .... One tablet every 12 hrs as needed 12)  Skelaxin 800 Mg Tabs (Metaxalone) ....  One by mouth three times a day prn 13)  Colace 100 Mg Caps (Docusate sodium) .... One by mouth bid 14)  Anusol-hc 25 Mg Supp (Hydrocortisone acetate) .... Insert one suppository into rectum at bedtime 15)  Cvs Iron 325 (65 Fe) Mg Tabs (Ferrous sulfate) .... One by mouth once daily 16)  Lactulose 10 Gm/31ml Soln (Lactulose) .Marland Kitchen.. 10 gm by mouth two times a day as needed for constipation 17)  Precision Qid Test Strp (Glucose blood) .... Use to test blood sugar three times a day ( 250.00) 18)  Precision Thin Lancets Misc (Lancets) .... Use to test blood sugar three times a day (250.00)  Patient Instructions: 1)  Please schedule a follow-up appointment in 2 months. Prescriptions: METFORMIN HCL 750 MG XR24H-TAB (METFORMIN HCL) one by mouth two times a day  #60 x 3   Entered and Authorized by:   D. Thomos Lemons DO   Signed by:   D. Thomos Lemons DO on 08/28/2009   Method used:   Electronically to        [id number]* (retail)       [address] [place], [place]  [address]       Ph: [phone]       Fax: [phone]   RxID:   [id number] LACTULOSE 10 GM/15ML SOLN (LACTULOSE) 10 gm by mouth two times a day as needed for constipation  #1 month x 1   Entered and Authorized by:   D. Thomos Lemons DO   Signed by:   D. Thomos Lemons DO on 08/28/2009   Method used:   Electronically to        [id number]* (retail)       [address] [place], [place]  [address]       Ph: [phone]       Fax: [phone]   RxID:   [id number]
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
## 2010-08-25 NOTE — Letter (Signed)
   [place] [facility] [address] [place], [place]  [address]  [place] Phone: [phone]      July 01, 2010   Christopher Riley [address] [place], [place] [address]  RE:  LAB RESULTS  Dear  Mr. Dissinger,  The following is an interpretation of your most recent lab tests.  Please take note of any instructions provided or changes to medications that have resulted from your lab work.  PSA:  normal - no follow-up needed PSA: 0.63  ELECTROLYTES:  Good - no changes needed  KIDNEY FUNCTION TESTS:  Good - no changes needed   THYROID STUDIES:  Thyroid studies normal TSH: 2.854     DIABETIC STUDIES:  Good - no changes needed Blood Glucose: 72   HgbA1C: 6.4   Microalbumin/Creatinine Ratio: 28.8     CBC:  Good - no changes needed  B12 level - normal       Sincerely Yours,    Dr. Thomos Lemons  Appended Document:  mailed

## 2010-08-25 NOTE — Assessment & Plan Note (Signed)
Summary: new pt cpx-ch   Vital Signs:  Patient Profile:   60 Years Old Male Height:     74.5 inches Weight:      313.50 pounds BMI:     39.86 Temp:     97.8 degrees F oral Pulse rate:   72 / minute Pulse rhythm:   regular Resp:     18 per minute BP sitting:   126 / 78  (right arm) Cuff size:   large  Vitals Entered By: Glendell Docker CMA (March 12, 2008 1:43 PM)                 Chief Complaint:  New Patient.  History of Present Illness: 60 year old African-American male to establish primary care.  He has history of chronic low back pain.  He is followed by orthopedic physician at [facility] in [place].  He reports history of bulging discs but is unsure whether he has spinal stenosis.  Patient complains of increasing burning sensation and tingling of his lower extremities over last several weeks.  His symptoms are worse in his left lower extremity.  He wears bilateral knee braces.  He has history of right knee replacement and previous left knee surgery with chronic knee instability.  Patient also has a history of prostate cancer.  He is followed by Dr. Brunilda Payor.  He reports completing radiation therapy.  Patient also reports history of posttraumatic stress disorder. He is a veteran from the [place] War.  He is followed by [facility] psychiatrist and is taking Seroquel and trazodone.  He reports history of previous suicide attempt when he lived in [place].  His mood has been relatively stable.   He denies history of type 2 diabetes but reports his podiatrist raised concerns about  elevated blood sugars in the past.    Current Allergies (reviewed today): No known allergies   Past Medical History:    Posttraumatic stress disorder    History of depression and suicide attempts    History of asthma    Prostate cancer    History of colon polyps    Urinary incontinence    Erectile dysfunction    Benign prostatic hypertrophy    Right retinal detachment  Past Surgical History:    Right knee replacement 1988    previous left knee surgery   Family History:    Diabetes - brother  Social History:    Retired    Married    Never Smoked    Alcohol use-no    Drug use-no   Risk Factors:  Tobacco use:  never Drug use:  no Alcohol use:  no  Colonoscopy History:     Date of Last Colonoscopy:  10/11/2007    Results:  abnormal    Review of Systems       The patient complains of dyspnea on exertion.  The patient denies chest pain, abdominal pain, melena, hematochezia, and severe indigestion/heartburn.         Poor vision in right eye due to history of retinal detachment.  Short term memory loss.  He denies severe back pain.   Physical Exam  General:     alert and overweight-appearing.   Head:     normocephalic and atraumatic.   Eyes:     vision grossly intact, pupils equal, pupils round, and pupils reactive to light.  bilateral arcus senilis Ears:     R ear normal and L ear normal.   Mouth:     Oral mucosa and oropharynx  without lesions or exudates.   Neck:     supple, no masses, and no carotid bruits.   Lungs:     normal respiratory effort and normal breath sounds.   Heart:     normal rate, regular rhythm, no murmur, and no gallop.   Abdomen:     catheter distended, nontender, unable to appreciate organomegaly, small umbilical hernia (reducible) Pulses:     dorsalis pedis and posterior tibial pulses are full and equal bilaterally Extremities:     trace left pedal edema and trace right pedal edema.   Neurologic:     alert & oriented X3 and cranial nerves II-XII intact.  decreased sensation to vibration of lower extremities    Impression & Recommendations:  Problem # 1:  PARESTHESIA (ICD-782.0) It is unclear  patient's lower ext symptoms of paresthesias are related to spinal stenosis versus possible peripheral neuropathy.  Request previous MRI of lumbosacral spine.  Initiate workup for peripheral neuropathy.  Obtain B12, TSH, hemoglobin A1c.   Increase gabapentin to 40 mg 3 times a day.  Refer to Dr. Fritzi Mandes nerve conduction study.  Problem # 2:  DIABETES MELLITUS, TYPE II, BORDERLINE (ICD-790.29) Patient has history of abnormal blood sugars in the past.  He certainly has multiple risk factors for type 2 diabetes including family history and seroquel use.  Obtain hemoglobin A1c.  Problem # 3:  ADENOCARCINOMA, PROSTATE (ICD-185) Assessment: Comment Only Obtain records from Dr. Brunilda Payor.  Problem # 4:  MEMORY LOSS (ICD-780.93) Check TSH, B12,  Folate, and RPR.   We will discuss further at next office visit.  Complete Medication List: 1)  Ditropan Xl 5 Mg Xr24h-tab (Oxybutynin chloride) .... Take 1 tablet by mouth two times a day 2)  Gabapentin 400 Mg Caps (Gabapentin) .... Take 1 tablet by mouth tid 3)  Seroquel 300 Mg Tabs (Quetiapine fumarate) .... Take 1 tab by mouth at bedtime as needed 4)  Tylenol 325 Mg Tabs (Acetaminophen) .... Take 1 tablet by mouth once a day as needed 5)  Trazodone Hcl 100 Mg Tabs (Trazodone hcl) .... Take 1 tab by mouth at bedtime  Other Orders: Misc. Referral (Misc. Ref)   Patient Instructions: 1)  Please schedule a follow-up appointment in 1 month. 2)  BMP prior to visit, ICD-9:  782.0 3)  Hepatic Panel prior to visit, ICD-9: 790.29 4)  Lipid Panel prior to visit, ICD-9:  790.29 5)  TSH prior to visit, ICD-9: 782.0 6)  CBC w/ Diff prior to visit, ICD-9:  285.9 7)  Urine-dip prior to visit, ICD-9: 790.29 8)  HbgA1C prior to visit, ICD-9: 782.0 9)  B12 and Folate:  782.0 10)  RPR:  782.0 11)  Please return for lab work one (1) week before your next appointment.  (Fasting)   Prescriptions: GABAPENTIN 400 MG  CAPS (GABAPENTIN) Take 1 tablet by mouth tid  #90 x 3   Entered and Authorized by:   D. Thomos Lemons DO   Signed by:   D. Thomos Lemons DO on 03/12/2008   Method used:   Faxed to ...       [id number]*       [address]       [place], [place]  [address]       Ph:  [phone]       Fax: [phone]   RxID:   [id number]  ] Current Allergies (reviewed today): No known allergies    Preventive Care Screening  Last Flu Shot:  Date:  03/12/2008    Results:  Declined to have  Last Tetanus Booster:    Date:  03/12/2008    Results:  Declined to have  Colonoscopy:    Date:  10/11/2007    Results:  abnormal

## 2010-08-25 NOTE — Progress Notes (Signed)
Summary: Lab Results  Phone Note Outgoing Call   Summary of Call: call pt - blood tests are normal.  I will send letter with details Initial call taken by: D. Thomos Lemons DO,  July 01, 2010 9:13 PM  Follow-up for Phone Call        call placed to patient at [phone], patient has been advised per Dr Artist Pais instructions Follow-up by: Glendell Docker CMA,  July 02, 2010 9:03 AM

## 2010-08-25 NOTE — Letter (Signed)
   [place] at [facility] [address] [place], [place]  [address]  [place] Phone: [phone]      January 12, 2010   BARON PARMELEE [address] [place], [place] [address]  RE:  LAB RESULTS  Dear  Mr. Kuehnel,  The following is an interpretation of your most recent lab tests.  Please take note of any instructions provided or changes to medications that have resulted from your lab work.  ELECTROLYTES:  Good - no changes needed  KIDNEY FUNCTION TESTS:  Good - no changes needed     CBC:  Good - no changes needed       Sincerely Yours,    Dr. Thomos Lemons

## 2010-08-25 NOTE — Progress Notes (Signed)
Summary: Blood Transfusion  Phone Note Call from Patient Call back at Home Phone [phone]   Caller: Patient Summary of Call: Patient called and stated he was called by oncall doctor about his blood test and was informed that  he needed a blood transfusion. He states he was advised to go last night, but he chose not to and wanted to see what Dr Artist Pais advises this morning Initial call taken by: Glendell Docker CMA,  July 29, 2009 8:11 AM  Follow-up for Phone Call        after speaking with Dr Artist Pais, call was placed to patient , and he was advised that his blood levels are critically low and Dr Artist Pais advises he be hospitalized, and he was to stop by office to pick up  orders. Patient verbalized understanding and  agrees. He states that he will have to get his wife to bring him over to pick up the paperwork Follow-up by: Glendell Docker CMA,  July 29, 2009 8:47 AM

## 2010-08-25 NOTE — Letter (Signed)
   [place] at [facility] [address] [place], [place]  [address]  [place] Phone: [phone]      April 03, 2010   Christopher Riley [address] [place], [place] [address]  RE:  LAB RESULTS  Dear  [name],  The following is an interpretation of your most recent lab tests.  Please take note of any instructions provided or changes to medications that have resulted from your lab work.  ELECTROLYTES:  Good - no changes needed  KIDNEY FUNCTION TESTS:  Good - no changes needed    DIABETIC STUDIES:  Good - no changes needed Blood Glucose: 76   HgbA1C: 6.4   Microalbumin/Creatinine Ratio: 19.9          Sincerely Yours,    Dr. Thomos Lemons  Appended Document:  mailed

## 2010-08-25 NOTE — Letter (Signed)
Summary: Diabetic Instructions  [facility]  [address] [place], [place] [address]   Phone: [phone]  Fax: [phone]    Christopher Riley Aug 20, 1950 MRN: [id number]   _X_   ORAL DIABETIC MEDICATION INSTRUCTIONS  The day before your procedure:   Take your diabetic pill as you do normally  The day of your procedure:   Do not take your diabetic pill    We will check your blood sugar levels during the admission process and again in Recovery before discharging you home

## 2010-08-25 NOTE — Assessment & Plan Note (Signed)
Summary: Long time Rectal Bleeding- jr, resched- jr   Vital Signs:  Patient profile:   60 year old male Height:      74.5 inches Weight:      315.75 pounds O2 Sat:      98 % on Room air Temp:     99.2 degrees F oral Pulse rate:   104 / minute BP sitting:   124 / 76  Vitals Entered By: Lucious Groves (July 28, 2009 11:11 AM)  O2 Flow:  Room air Pain Assessment Patient in pain? no        Primary Care Provider:  Dondra Spry DO   History of Present Illness: 60 y/o AA male c/o rectal bleeding.  His symptoms getting worse.  He notes blood looks darker.  He has associated fatigue.  His last colonoscopy was at [facility]  (Sept, 2010).  Polyp was reported.  He has not had resection of polyp.   He also notes hx of hemorrhoids.  He denies use of aspirin or NSAIDs.  DM II - stable  Shoulder pain - s/p surgery.  finished rehab.  still with  decreased mobility  Current Medications (verified): 1)  Ditropan Xl 5 Mg  Xr24h-Tab (Oxybutynin Chloride) .... Take 1 Tablet By Mouth Two Times A Day 2)  Gabapentin 400 Mg  Caps (Gabapentin) .... Take 1 Tablet By Mouth Tid 3)  Seroquel 300 Mg  Tabs (Quetiapine Fumarate) .... Take 1 Tab By Mouth At Bedtime As Needed 4)  Tylenol 325 Mg  Tabs (Acetaminophen) .... Take 1 Tablet By Mouth Once A Day As Needed 5)  Trazodone Hcl 100 Mg  Tabs (Trazodone Hcl) .... Take 1 Tab By Mouth At Bedtime 6)  Metformin Hcl 750 Mg Xr24h-Tab (Metformin Hcl) .... One By Mouth Two Times A Day 7)  Simvastatin 40 Mg Tabs (Simvastatin) .... One By Mouth At Bedtime 8)  Lisinopril 20 Mg Tabs (Lisinopril) .... Take 1 Tablet By Mouth Once A Day 9)  Proair Hfa 108 (90 Base) Mcg/act Aers (Albuterol Sulfate) .... 2 Puffs By Mouth Two Times A Day As Needed 10)  Asmanex 14 Metered Doses 220 Mcg/inh Aepb (Mometasone Furoate) .Marland Kitchen.. 1 Puff By Mouth Once Daily As Needed 11)  Oxycodone-Acetaminophen 10-325 Mg Tabs (Oxycodone-Acetaminophen) .... One Tablet Every 12 Hrs As Needed 12)  Skelaxin 800  Mg Tabs (Metaxalone) .... One By Mouth Three Times A Day Prn  Allergies (verified): No Known Drug Allergies  Past History:  Past Medical History: Posttraumatic stress disorder History of depression and suicide attempts History of asthma Prostate cancer S/P radioactive seed tx  (Dr. Brunilda Payor) History of colon polyps Urinary incontinence    Erectile dysfunction   Benign prostatic hypertrophy Right retinal detachment    Diabetes Mellitus Type II     Hypertension  Past Surgical History: Right knee replacement 1988  previous left knee surgery          Right shoulder arthroscopic surgery - 05/2009   Family History: Diabetes - brother          Social History: Retired Married  Never Smoked Alcohol use-no   Drug use-no              Review of Systems  The patient denies chest pain.         fatigue  Physical Exam  General:  alert and overweight-appearing.   Neck:  supple, no masses, and no carotid bruits.   Lungs:  normal respiratory effort and normal breath sounds.  Heart:  normal rate, regular rhythm, and no gallop.   Abdomen:  soft, non-tender, normal bowel sounds, and no masses.   Rectal:  stool positive for occult blood and internal hemorrhoid(s).   Extremities:  trace left pedal edema and trace right pedal edema.   Neurologic:  cranial nerves II-XII intact.   Psych:  normally interactive and good eye contact.    Diabetes Management Exam:    Foot Exam (with socks and/or shoes not present):       Inspection:          Left foot: abnormal             Comments: dry and scaly          Right foot: abnormal             Comments: dry and scaly   Impression & Recommendations:  Problem # 1:  RECTAL BLEEDING (ICD-569.3) Chronic rectal bleeding worse recently.  He has palpable internal hemorrhoid.  He may also have diverticular bleed.  Check CBC.  Try to manage as out pt but if significant anemia, he will require hospital admission and GI eval.  Orders: T-CBC w/Diff  ([id number]) Surgical Referral (Surgery)  Problem # 2:  DIABETES MELLITUS, TYPE II (ICD-250.00) stable.  monitor labs.  His updated medication list for this problem includes:    Metformin Hcl 750 Mg Xr24h-tab (Metformin hcl) ..... One by mouth two times a day    Lisinopril 20 Mg Tabs (Lisinopril) .Marland Kitchen... Take 1 tablet by mouth once a day  Orders: T-Basic Metabolic Panel [phone]) T- Hemoglobin A1C ([id number]) T-Urine Microalbumin w/creat. ratio [phone])  Labs Reviewed: Creat: 0.9 (01/06/2009)     Last Eye Exam: normal (03/21/2008) Reviewed HgBA1c results: 6.5 (01/06/2009)  6.5 (07/22/2008)  Problem # 3:  HYPERTENSION (ICD-401.9) stable.  Maintain current medication regimen.  His updated medication list for this problem includes:    Lisinopril 20 Mg Tabs (Lisinopril) .Marland Kitchen... Take 1 tablet by mouth once a day  BP today: 124/76 Prior BP: 122/76 (05/14/2009)  Labs Reviewed: K+: 3.6 (01/06/2009) Creat: : 0.9 (01/06/2009)   Chol: 178 (01/06/2009)   HDL: 43.40 (01/06/2009)   LDL: 117 (01/06/2009)   TG: 88.0 (01/06/2009)  Problem # 4:  HYPERLIPIDEMIA (ICD-272.4) Assessment: Unchanged  His updated medication list for this problem includes:    Simvastatin 40 Mg Tabs (Simvastatin) ..... One by mouth at bedtime  Orders: T-Hepatic Function [id number]) T-Lipid Profile [phone])  Complete Medication List: 1)  Ditropan Xl 5 Mg Xr24h-tab (Oxybutynin chloride) .... Take 1 tablet by mouth two times a day 2)  Gabapentin 400 Mg Caps (Gabapentin) .... Take 1 tablet by mouth tid 3)  Seroquel 300 Mg Tabs (Quetiapine fumarate) .... Take 1 tab by mouth at bedtime as needed 4)  Tylenol 325 Mg Tabs (Acetaminophen) .... Take 1 tablet by mouth once a day as needed 5)  Trazodone Hcl 100 Mg Tabs (Trazodone hcl) .... Take 1 tab by mouth at bedtime 6)  Metformin Hcl 750 Mg Xr24h-tab (Metformin hcl) .... One by mouth two times a day 7)  Simvastatin 40 Mg Tabs (Simvastatin) ....  One by mouth at bedtime 8)  Lisinopril 20 Mg Tabs (Lisinopril) .... Take 1 tablet by mouth once a day 9)  Proair Hfa 108 (90 Base) Mcg/act Aers (Albuterol sulfate) .... 2 puffs by mouth two times a day as needed 10)  Asmanex 14 Metered Doses 220 Mcg/inh Aepb (Mometasone furoate) .Marland Kitchen.. 1 puff by mouth once daily as needed 11)  Oxycodone-acetaminophen 10-325 Mg Tabs (Oxycodone-acetaminophen) .... One tablet every 12 hrs as needed 12)  Skelaxin 800 Mg Tabs (Metaxalone) .... One by mouth three times a day prn 13)  Colace 100 Mg Caps (Docusate sodium) .... One by mouth bid  Patient Instructions: 1)  Please schedule a follow-up appointment in 1 month. 2)  Use lamisit cream over the counter 2 x per day on your feet 3)  Call our office if your symptoms do not  improve or gets worse. Prescriptions: COLACE 100 MG CAPS (DOCUSATE SODIUM) one by mouth bid  #60 x 1   Entered and Authorized by:   D. Thomos Lemons DO   Signed by:   D. Thomos Lemons DO on 07/28/2009   Method used:   Electronically to        [id number] (retail)       [address] [place], [place]  [address]       Ph: [phone]       Fax: [phone]   RxID:   [id number]   Preventive Care Screening  Colonoscopy:    Date:  03/26/2009    Results:  Historical

## 2010-08-25 NOTE — Letter (Signed)
Summary: EGD Instructions  [facility]  [address] [place], [place] [address]   Phone: [phone]  Fax: [phone]       Christopher Riley    08/15/1950    MRN: [id number]       Procedure Day Dorna Bloom: Jake Shark, 11/04/09     Arrival Time: 2:30 PM     Procedure Time: 3:30 PM     Location of Procedure:                    _X_ [facility] (4th Floor)   PREPARATION FOR ENDOSCOPY   On TUESDAY, 11/04/09 THE DAY OF THE PROCEDURE:  1.   No solid foods, milk or milk products are allowed after midnight the night before your procedure.  2.   Do not drink anything colored red or purple.  Avoid juices with pulp.  No orange juice.  3.  You may drink clear liquids until 1:30 PM, which is 2 hours before your procedure.                                                                                                CLEAR LIQUIDS INCLUDE: Water Jello Ice Popsicles Tea (sugar ok, no milk/cream) Powdered fruit flavored drinks Coffee (sugar ok, no milk/cream) Gatorade Juice: apple, white grape, white cranberry  Lemonade Clear bullion, consomm, broth Carbonated beverages (any kind) Strained chicken noodle soup Hard Candy   MEDICATION INSTRUCTIONS  Unless otherwise instructed, you should take regular prescription medications with a small sip of water as early as possible the morning of your procedure.  Diabetic patients - see separate instructions.            OTHER INSTRUCTIONS  You will need a responsible adult at least 62 years of age to accompany you and drive you home.   This person must remain in the waiting room during your procedure.  Wear loose fitting clothing that is easily removed.  Leave jewelry and other valuables at home.  However, you may wish to bring a book to read or an iPod/MP3 player to listen to music as you wait for your procedure to start.  Remove all body piercing jewelry and leave at home.  Total time from sign-in until  discharge is approximately 2-3 hours.  You should go home directly after your procedure and rest.  You can resume normal activities the day after your procedure.  The day of your procedure you should not:   Drive   Make legal decisions   Operate machinery   Drink alcohol   Return to work  You will receive specific instructions about eating, activities and medications before you leave.    The above instructions have been reviewed and explained to me by   _______________________    I fully understand and can verbalize these instructions _____________________________ Date _________

## 2010-08-25 NOTE — Progress Notes (Signed)
Summary: Test Strip & Lancet Refills  Phone Note From Pharmacy   Caller: [facility] [address] [id number]* Call For: Christopher Riley  Summary of Call: patient would like new rx for precision test strips [facility] lancets [facility] [address] [place] [address] fax [phone] Initial call taken by: Roselle Locus,  September 05, 2009 1:01 PM  Follow-up for Phone Call        Rx's sent to pharmacy Follow-up by: Glendell Docker CMA,  September 05, 2009 4:48 PM    New/Updated Medications: PRECISION QID TEST  STRP (GLUCOSE BLOOD) use to test blood sugar three times a day ( 250.00) PRECISION THIN LANCETS  MISC (LANCETS) use to test blood sugar three times a day (250.00) Prescriptions: PRECISION THIN LANCETS  MISC (LANCETS) use to test blood sugar three times a day (250.00)  #90 x 3   Entered by:   Glendell Docker CMA   Authorized by:   D. Thomos Lemons DO   Signed by:   Glendell Docker CMA on 09/05/2009   Method used:   Electronically to        [facility] #[id number]* (retail)       [address] [place], [place]  [address]       Ph: [phone]       Fax: [phone]   RxID:   [id number] PRECISION QID TEST  STRP (GLUCOSE BLOOD) use to test blood sugar three times a day ( 250.00)  #100 x 3   Entered by:   Glendell Docker CMA   Authorized by:   D. Thomos Lemons DO   Signed by:   Glendell Docker CMA on 09/05/2009   Method used:   Electronically to        [facility][id number]* (retail)       [address] [place], [place]  [address]       Ph: [phone]       Fax: [phone]   RxID:   [id number]

## 2010-08-25 NOTE — Progress Notes (Signed)
Summary: Mri Results  Phone Note Outgoing Call   Summary of Call: call pt - MRI of LS spine shows arthritic changes.  ? slight compressin of L4-5 nerve root compression (right side).  This does not correspond with pt's left sided syptoms.  I will further discuss at next OV. Initial call taken by: D. Thomos Lemons DO,  March 28, 2008 9:45 AM  Follow-up for Phone Call        Left voice message at [phone] to call Follow-up by: Glendell Docker CMA,  March 28, 2008 4:53 PM  Additional Follow-up for Phone Call Additional follow up Details #1::        patient advised per Dr Artist Pais instructions  Additional Follow-up by: Glendell Docker CMA,  March 29, 2008 9:39 AM

## 2010-08-25 NOTE — Procedures (Signed)
Summary: Colonoscopy  Patient: Christopher Riley Note: All result statuses are Final unless otherwise noted.  Tests: (1) Colonoscopy (COL)   COL Colonoscopy           DONE     [facility]     [address]     [place], [place]  [address]           COLONOSCOPY PROCEDURE REPORT           PATIENT:  Christopher, Riley  MR#:  [id number]     BIRTHDATE:  1950/10/21, 58 yrs. old  GENDER:  male           ENDOSCOPIST:  Iva Boop, MD, [facility]     Referred by:          Hospitalist           PROCEDURE DATE:  07/30/2009     PROCEDURE:  Colonoscopy [address]     ASA CLASS:  Class III     INDICATIONS:  rectal bleeding, iron deficiency anemia Hgb 10 to     6.8 in about 1 month           MEDICATIONS:   Fentanyl IV, Versed IV see RN notes for amounts           DESCRIPTION OF PROCEDURE:   After the risks benefits and     alternatives of the procedure were thoroughly explained, informed     consent was obtained.  Digital rectal exam was performed and     revealed no abnormalities.   The [id number] ([id number]) endoscope was     introduced through the anus and advanced to the cecum, which was     identified by both the appendix and ileocecal valve, without     limitations.  The quality of the prep was good, using Colyte.  The     instrument was then slowly withdrawn as the colon was fully     examined. Insertion time 7 minutes withdrawal time 12 minutes     <<PROCEDUREIMAGES>>           FINDINGS:  Mild diverticulosis was found in the sigmoid colon.     This was otherwise a normal examination of the colon.   Retroflexed     views in the rectum revealed internal hemorrhoids.  Some very red     areas on hemorrhoids, minimal changes that are probably     traumatized hemorrhoids. ? if could be angiodysplasia from     radioactive seed implants (prostate cancer). Only seen in     retroflex and do not think very amenable to [facility] ablation given     location at/near anal verge.  The scope was  then withdrawn from     the patient and the procedure completed.           COMPLICATIONS:  None           ENDOSCOPIC IMPRESSION:     1) Mild diverticulosis in the sigmoid colon     2) Otherwise normal examination of colon     3) Internal hemorrhoids in the rectum and anal canal, some ?     early radiation changes (prior radioactive seeds)     RECOMMENDATIONS:     hydrocortisone 25 mgsuppositories nightly for 2 weeks then as     needed     needs iron supplementation - I ordered IV iron     could need an EGD pending full record review from [place] -  needs     outpatient follow-up with me  to determine any further evaluation     APPT made for 08/21/09 845 AM     ok for discharge from GI perspective           REPEAT EXAM:  cannot determine well until prior colonoscopy and     any pathology reports viewe, he reported a polyp removed and 1     left at colonoscopy in fall 2010 [facility] [place])           Iva Boop, MD, [facility]           CC:  Thomos Lemons, DO           n.     eSIGNED:   Iva Boop at 07/30/2009 01:35 PM           Ashley Royalty, [id number]  Note: An exclamation mark (!) indicates a result that was not dispersed into the flowsheet. Document Creation Date: 07/30/2009 1:34 PM _______________________________________________________________________  (1) Order result status: Final Collection or observation date-time: 07/30/2009 13:14 Requested date-time:  Receipt date-time:  Reported date-time:  Referring Physician:   Ordering Physician: Stan Head [id number]) Specimen Source:  Source: Launa Grill Order Number: [id number] Lab site:

## 2010-08-25 NOTE — Miscellaneous (Signed)
Summary: Eye Exam  Clinical Lists Changes  Observations: Added new observation of DMEYEEXAMNXT: 03/2009 (02/06/2009 16:36) Added new observation of DMEYEEXMRES: normal (03/21/2008 16:38) Added new observation of EYE EXAM BY: Hyacinth [facility] [phone] (03/21/2008 16:38) Added new observation of DIAB EYE EX: normal (03/21/2008 16:38)       Diabetes Management Exam:    Eye Exam:       Eye Exam done elsewhere          Date: 03/21/2008          Results: normal          Done by: Hyacinth [facility] [phone]
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
## 2010-08-25 NOTE — Progress Notes (Signed)
Summary: Diabetes Testing Supplies  Phone Note From Pharmacy   Caller: [facility] [address] #[id number]* Summary of Call: fax from pharmacy stating precision test strips are no longer available substitute needed.  Rx's changed to  Accu chek Aviva.  attempted to contact patient at [phone] no answer,  detailed voice message left informing patient of rx change. He has been advised to call if glucometer is needed. Initial call taken by: Glendell Docker CMA,  September 09, 2009 5:19 PM    New/Updated Medications: ACCU-CHEK AVIVA  KIT (BLOOD GLUCOSE MONITORING SUPPL) use to test blood sugar (250.00) ACCU-CHEK AVIVA  STRP (GLUCOSE BLOOD) use to test blood sugar three times a day (250.00) ACCU-CHEK MULTICLIX LANCETS  MISC (LANCETS) use to test blood sugar three times a day (250.00) Prescriptions: ACCU-CHEK MULTICLIX LANCETS  MISC (LANCETS) use to test blood sugar three times a day (250.00)  #90 x 3   Entered by:   Glendell Docker CMA   Authorized by:   D. Thomos Lemons DO   Signed by:   Glendell Docker CMA on 09/09/2009   Method used:   Electronically to        [facility] #[id number]* (retail)       [address] [place], [place]  [address]       Ph: [phone]       Fax: [phone]   RxID:   [id number] ACCU-CHEK AVIVA  STRP (GLUCOSE BLOOD) use to test blood sugar three times a day (250.00)  #90 x 2   Entered by:   Glendell Docker CMA   Authorized by:   D. Thomos Lemons DO   Signed by:   Glendell Docker CMA on 09/09/2009   Method used:   Electronically to        [facility] #[id number]* (retail)       [address] [place], [place]  [address]       Ph: [phone]       Fax: [phone]   RxID:   [id number] ACCU-CHEK AVIVA  KIT (BLOOD GLUCOSE MONITORING SUPPL) use to test blood sugar (250.00)  #1 x 0   Entered by:   Glendell Docker CMA   Authorized by:   D. Thomos Lemons DO   Signed by:   Glendell Docker CMA on 09/09/2009   Method used:   Electronically to      [facility] #[id number]* (retail)       [address] [place], [place]  [address]       Ph: [phone]       Fax: [phone]   RxID:   [id number]

## 2010-08-25 NOTE — Progress Notes (Signed)
Summary: DATE OF LAST MRI  Phone Note Call from Patient Call back at [phone]   Details for Reason: MRI Summary of Call: MOST RECENT MRI PT. HAS IS FROM 2002 AND PT. STATES DR. Shae Hinnenkamp WANTED HIS SPINAL DIAGNOSIS & WANTS TO KNOW  IF HE SHOULD SET UP APPT. FOR A MORE RECENT MRI FOR DR.Aarohi Redditt. Initial call taken by: Michaelle Copas,  March 13, 2008 11:15 AM  Follow-up for Phone Call        I would like to review previous MRI first Follow-up by: D. Thomos Lemons DO,  March 13, 2008 11:22 AM
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
## 2010-08-25 NOTE — Progress Notes (Signed)
Summary: Christopher Riley asking about his diabetic shoes   Phone Note Call from Patient   Caller: patient  live Call For: Artist Pais  Summary of Call: Christopher Riley is in the office to inquire about the order for his diabetic shoes.  [facility] sent an order to our office on 12-20.  Darlys Gales has not received the order back from our office yet.  Please let Christopher Riley know what happened.  [phone] Initial call taken by: Roselle Locus,  September 08, 2009 11:37 AM  Follow-up for Phone Call        patient advised request faxed for Diabetic supplies Follow-up by: Glendell Docker CMA,  September 09, 2009 1:21 PM

## 2010-08-25 NOTE — Assessment & Plan Note (Signed)
Summary: Shoulder pain ( can't hardly lift it )   Vital Signs:  Patient Profile:   60 Years Old Male Height:     74.5 inches Weight:      315 pounds BMI:     40.05 Temp:     97.8 degrees F oral Pulse rate:   88 / minute Pulse rhythm:   regular Resp:     20 per minute BP sitting:   130 / 70  (left arm) Cuff size:   large  Vitals Entered By: Glendell Docker CMA (July 12, 2008 8:39 AM)                 PCP:  Dondra Spry DO  Chief Complaint:  right shoulder pain & unresolved foot numbness and Shoulder pain.  History of Present Illness:  Shoulder Pain      This is a 60 year old man who presents with Shoulder pain.  The patient reports stiffness and impaired ROM, but denies swelling and redness.  The pain is located in the right shoulder.  The pain began without injury.  The patient describes the pain as intermittent.      Current Allergies (reviewed today): No known allergies   Past Medical History:    Posttraumatic stress disorder    History of depression and suicide attempts    History of asthma    Prostate cancer    History of colon polyps    Urinary incontinence    Erectile dysfunction    Benign prostatic hypertrophy    Right retinal detachment       Diabetes Mellitus Type II   Past Surgical History:    Right knee replacement 1988    previous left knee surgery       Social History:    Retired    Married    Never Smoked    Alcohol use-no    Drug use-no              Physical Exam  General:     alert, well-developed, and well-nourished.   Neck:     supple and no masses.  no carotid bruits.   Lungs:     normal respiratory effort and normal breath sounds.   Heart:     normal rate, regular rhythm, and no gallop.   Msk:     unable raise right arm past and 90 degrees.   atrophy of deltoid. Extremities:     trace left pedal edema and trace right pedal edema.      Impression & Recommendations:  Problem # 1:  SHOULDER PAIN, RIGHT  (ICD-719.41) Pt unable to abduct right shoulder.   I suspect rotator cuff tear vs tendinitis.   Refer to ortho for further eval and treatment.  His updated medication list for this problem includes:    Tylenol 325 Mg Tabs (Acetaminophen) .Marland Kitchen... Take 1 tablet by mouth once a day as needed    Tramadol Hcl 50 Mg Tabs (Tramadol hcl) .Marland Kitchen... Confirm dose with pt  Orders: T-Shoulder Right (73030TC) Orthopedic Referral (Ortho)   Problem # 2:  PARESTHESIA (ICD-782.0) Pt with chronic lower pain from polyneuropathy.    Continue gabapentin.  Complete Medication List: 1)  Ditropan Xl 5 Mg Xr24h-tab (Oxybutynin chloride) .... Take 1 tablet by mouth two times a day 2)  Gabapentin 400 Mg Caps (Gabapentin) .... Take 1 tablet by mouth tid 3)  Seroquel 300 Mg Tabs (Quetiapine fumarate) .... Take 1 tab by mouth at bedtime as needed 4)  Tylenol 325 Mg Tabs (Acetaminophen) .... Take 1 tablet by mouth once a day as needed 5)  Trazodone Hcl 100 Mg Tabs (Trazodone hcl) .... Take 1 tab by mouth at bedtime 6)  Metformin Hcl 750 Mg Xr24h-tab (Metformin hcl) .... One by mouth two times a day 7)  Simvastatin 40 Mg Tabs (Simvastatin) .... One by mouth at bedtime 8)  Tramadol Hcl 50 Mg Tabs (Tramadol hcl) .... Confirm dose with pt 9)  Lisinopril 20 Mg Tabs (Lisinopril) .... Confirm dose with pt 10)  Proair Hfa 108 (90 Base) Mcg/act Aers (Albuterol sulfate) 11)  Asmanex 14 Metered Doses 220 Mcg/inh Aepb (Mometasone furoate) .... Confirm dose with pt 12)  Voltaren 1 % Gel (Diclofenac sodium) .... Apply 2 grams qid    Prescriptions: VOLTAREN 1 % GEL (DICLOFENAC SODIUM) apply 2 grams qid  #5 pk x 0   Entered and Authorized by:   D. Thomos Lemons DO   Signed by:   D. Thomos Lemons DO on 07/12/2008   Method used:   Electronically to        [id number]* (retail)       [address] [place], [place]  [address]       Ph: [phone]       Fax: [phone]   RxID:   [id number]  ] Current  Allergies (reviewed today): No known allergies
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
## 2010-08-25 NOTE — Procedures (Signed)
Summary: Upper Endoscopy  Patient: [name] Note: All result statuses are Final unless otherwise noted.  Tests: (1) Upper Endoscopy (EGD)   EGD Upper Endoscopy       [facility]ONE      [facility]     [address]     [place], [place]  [address]           ENDOSCOPY PROCEDURE REPORT           PATIENT:  Christopher Riley, Christopher Riley  MR#:  [id number]     BIRTHDATE:  Mar 16, 1951, 58 yrs. old  GENDER:  male           ENDOSCOPIST:  Iva Boop, MD, [facility]           PROCEDURE DATE:  11/04/2009     PROCEDURE:  EGD with biopsy     ASA CLASS:  Class III     INDICATIONS:  iron deficiency anemia due to chronic blood loss     probably from chronic rectal bleeding but not certain           MEDICATIONS:   Fentanyl 75 mcg IV, Versed 5 mg     TOPICAL ANESTHETIC:  Exactacain Spray           DESCRIPTION OF PROCEDURE:   After the risks benefits and     alternatives of the procedure were thoroughly explained, informed     consent was obtained.  The LB [id number] endoscope was     introduced through the mouth and advanced to the second portion of     the duodenum, without limitations.  The instrument was slowly     withdrawn as the mucosa was fully examined.     <<PROCEDUREIMAGES>>           There were columnar mucosal changes consistent with Barrett's     esophagus identified. in the distal esophagus. 1 cm tongue (40-39)     and a linear area just above that about 1.5 cm long. Multiple     biopsies were obtained and sent to pathology.  A hiatal hernia was     found. It was 5 cm in size. 40-45 cm  Otherwise the examination     was normal.    Retroflexed views revealed a hiatal hernia.    The     scope was then withdrawn from the patient and the procedure     completed.           COMPLICATIONS:  None           ENDOSCOPIC IMPRESSION:     1) Columnar mucosa, ? Barrett's in the distal esophagus -     biopsied     2) 5 cm hiatal hernia     3) Otherwise normal examination      RECOMMENDATIONS:     1) Continue iron (Hgb back to normal but do not think iron     repleted)     2) await biopsies re: possible Barrett's esophagus, will notfy           REPEAT EXAM:  await biopsies to determine need           Iva Boop, MD, Clementeen Graham           CC:  Thomos Lemons, DO, Consuello Bossier, MD, The Patient           n.     eSIGNED:   Iva Boop at 11/04/2009 03:55 PM  [name], [name], [id number]  Note: An exclamation mark (!) indicates a result that was not dispersed into the flowsheet. Document Creation Date: 11/04/2009 3:56 PM _______________________________________________________________________  (1) Order result status: Final Collection or observation date-time: 11/04/2009 15:46 Requested date-time:  Receipt date-time:  Reported date-time:  Referring Physician:   Ordering Physician: Stan Head [phone]) Specimen Source:  Source: Launa Grill Order Number: [id number] Lab site:   Appended Document: Upper Endoscopy     Procedures Next Due Date:    EGD: 10/2010
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
## 2010-08-25 NOTE — Assessment & Plan Note (Signed)
Summary: f/u apt-ch   Vital Signs:  Patient Profile:   60 Years Old Male Height:     74.5 inches Weight:      314.50 pounds BMI:     39.98 Temp:     98.4 degrees F oral Pulse rate:   68 / minute Resp:     20 per minute BP sitting:   126 / 70  (right arm) Cuff size:   large                 Chief Complaint:  Discuss Labs.  History of Present Illness: 60 y/o AA male for follow up.  Pt having persistent lower extremity symptoms and low back pain.  He notes slight improvement with higher dose of gabapentin but also notes increased somnolence when he takes higher dose.  We reviewed recent labs.  A1c is elevated at 6.5.  He has family hx of diabetes.  He does not currently follow diabetic diet.    Current Allergies (reviewed today): No known allergies   Past Medical History:    Posttraumatic stress disorder    History of depression and suicide attempts    History of asthma    Prostate cancer    History of colon polyps    Urinary incontinence    Erectile dysfunction    Benign prostatic hypertrophy    Right retinal detachment   Past Surgical History:    Right knee replacement 1988    previous left knee surgery    Social History:    Retired    Married    Never Smoked    Alcohol use-no    Drug use-no     Review of Systems      See HPI   Physical Exam  General:     alert and overweight-appearing.   Neck:     supple, no masses, and no carotid bruits.   Lungs:     normal respiratory effort and normal breath sounds.   Heart:     normal rate, regular rhythm, no murmur, and no gallop.   Msk:     bilateral knee brace Extremities:     trace left pedal edema and trace right pedal edema.   Neurologic:     alert & oriented X3 and cranial nerves II-XII intact.  decreased sensation to vibration of lower extremities.      Impression & Recommendations:  Problem # 1:  LOW BACK PAIN, CHRONIC (ICD-724.2) Pt with persistent LE symptoms and chronic back pain.    Obtain MRI of Lumbar spine considering hx of prostate cancer. His updated medication list for this problem includes:    Tylenol 325 Mg Tabs (Acetaminophen) .Marland Kitchen... Take 1 tablet by mouth once a day as needed  Orders: Radiology Referral (Radiology)   Problem # 2:  DIABETES MELLITUS, TYPE II, BORDERLINE (ICD-790.29) We discussed need for dietary and lifestyle changes.  Start glucophage xr.  His updated medication list for this problem includes:    Glucophage Xr 500 Mg Xr24h-tab (Metformin hcl) ..... One by mouth two times a day  Labs Reviewed: HgBA1c: 6.5 (03/13/2008)   Creat: 0.9 (03/13/2008)      Problem # 3:  HYPERLIPIDEMIA (ICD-272.4) Goal LDL <100.  Start simvastatin.    His updated medication list for this problem includes:    Simvastatin 20 Mg Tabs (Simvastatin) ..... One by mouth qpm  Labs Reviewed: Chol: 182 (03/13/2008)   HDL: 41.4 (03/13/2008)   LDL: 117 (03/13/2008)   TG: 117 (03/13/2008) SGOT: 21 (  03/13/2008)   SGPT: 23 (03/13/2008)   Complete Medication List: 1)  Ditropan Xl 5 Mg Xr24h-tab (Oxybutynin chloride) .... Take 1 tablet by mouth two times a day 2)  Gabapentin 400 Mg Caps (Gabapentin) .... Take 1 tablet by mouth tid 3)  Seroquel 300 Mg Tabs (Quetiapine fumarate) .... Take 1 tab by mouth at bedtime as needed 4)  Tylenol 325 Mg Tabs (Acetaminophen) .... Take 1 tablet by mouth once a day as needed 5)  Trazodone Hcl 100 Mg Tabs (Trazodone hcl) .... Take 1 tab by mouth at bedtime 6)  Glucophage Xr 500 Mg Xr24h-tab (Metformin hcl) .... One by mouth two times a day 7)  Simvastatin 20 Mg Tabs (Simvastatin) .... One by mouth qpm   Patient Instructions: 1)  Please schedule a follow-up appointment in 6 weeks. 2)  AST, ALT prior to visit, ICD-9: 272.4 3)  Lipid Panel prior to visit, ICD-9: 272.4 4)  Please return for lab work one (1) week before your next appointment.    Prescriptions: SIMVASTATIN 20 MG TABS (SIMVASTATIN) one by mouth qpm  #30 x 3   Entered and  Authorized by:   D. Thomos Lemons DO   Signed by:   D. Thomos Lemons DO on 03/22/2008   Method used:   Electronically to        [id number]* (retail)       [address] [place], [place]  [address]       Ph: [phone]       Fax: [phone]   RxID:   [id number] GLUCOPHAGE XR 500 MG XR24H-TAB (METFORMIN HCL) one by mouth two times a day  #60 x 2   Entered and Authorized by:   D. Thomos Lemons DO   Signed by:   D. Thomos Lemons DO on 03/22/2008   Method used:   Electronically to        [id number]* (retail)       [address] [place], [place]  [address]       Ph: [phone]       Fax: [phone]   RxID:   [id number]  ] Current Allergies (reviewed today): No known allergies
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
## 2010-08-25 NOTE — Assessment & Plan Note (Signed)
Summary: HOSP F/U IRON DEF ANEMIA.Marland KitchenEM   History of Present Illness Visit Type: Initial Visit Primary GI MD: Stan Head MD [facility] Primary Provider: Thomos Lemons, MD Chief Complaint: Patient here for f/u hospitalization for anemia. Patient states that he feels okay. He does still have some problems with hemorrhoidal bleeding. History of Present Illness:   60 yo African -American man seen for anemia and rectal bleeding in hospital earlier this month. Colonoscopy showed diverticulosis, internal hemorrhoids and ? of mild radiation proctitis. He continues to bleed bright red blood per rectum and is not taking hydrocortisone suppositories as recommended . He saw Dr. Zachery Dakins re: hemorrhoids, and has follow-up 2/10. He is not  able to give me details of that meeting and I do not heva records. Colonoscopy findings and dc summary reviewed. He received an IV iron infusion in the hospital.   GI Review of Systems      Denies abdominal pain, acid reflux, belching, bloating, chest pain, dysphagia with liquids, dysphagia with solids, heartburn, loss of appetite, nausea, vomiting, vomiting blood, weight loss, and  weight gain.      Reports hemorrhoids, rectal bleeding, and  rectal pain.     Denies anal fissure, black tarry stools, change in bowel habit, constipation, diarrhea, diverticulosis, fecal incontinence, heme positive stool, irritable bowel syndrome, jaundice, light color stool, and  liver problems.    Current Medications (verified): 1)  Ditropan Xl 5 Mg  Xr24h-Tab (Oxybutynin Chloride) .... Take 1 Tablet By Mouth Two Times A Day 2)  Gabapentin 400 Mg  Caps (Gabapentin) .... Take 1 Tablet By Mouth Tid 3)  Seroquel 300 Mg  Tabs (Quetiapine Fumarate) .... Take 1 Tab By Mouth At Bedtime As Needed 4)  Tylenol 325 Mg  Tabs (Acetaminophen) .... Take 1 Tablet By Mouth Once A Day As Needed 5)  Trazodone Hcl 100 Mg  Tabs (Trazodone Hcl) .... Take 1 Tab By Mouth At Bedtime 6)  Metformin Hcl 750 Mg Xr24h-Tab  (Metformin Hcl) .... One By Mouth Two Times A Day 7)  Simvastatin 40 Mg Tabs (Simvastatin) .... One By Mouth At Bedtime 8)  Lisinopril 20 Mg Tabs (Lisinopril) .... Take 1 Tablet By Mouth Once A Day 9)  Proair Hfa 108 (90 Base) Mcg/act Aers (Albuterol Sulfate) .... 2 Puffs By Mouth Two Times A Day As Needed 10)  Asmanex 14 Metered Doses 220 Mcg/inh Aepb (Mometasone Furoate) .Marland Kitchen.. 1 Puff By Mouth Once Daily As Needed 11)  Oxycodone-Acetaminophen 10-325 Mg Tabs (Oxycodone-Acetaminophen) .... One Tablet Every 12 Hrs As Needed 12)  Skelaxin 800 Mg Tabs (Metaxalone) .... One By Mouth Three Times A Day Prn 13)  Colace 100 Mg Caps (Docusate Sodium) .... One By Mouth Bid  Allergies (verified): No Known Drug Allergies  Past History:  Past Medical History: Posttraumatic stress disorder History of depression and suicide attempts History of asthma Prostate cancer S/P radioactive seed tx  (Dr. Brunilda Payor) History of colon polyps (per patient) Urinary incontinence    Erectile dysfunction   Benign prostatic hypertrophy Right retinal detachment    Diabetes Mellitus Type II     Hypertension Bleeding hemorrhoids Iron-deficiency Anemia  Past Surgical History: Right knee replacement 1988  previous left knee surgery          Right shoulder arthroscopic surgery - 06-2009  Family History: Diabetes - brother         No FH of Colon Cancer:  Social History: Retired Married  Never Smoked Alcohol use-no   Drug use-no  Daily Caffeine Use-1 cup daily  Review of Systems       The patient complains of anemia, shortness of breath, and sleeping problems.         All other ROS negative except as per HPI.   Vital Signs:  Patient profile:   60 year old male Height:      74.5 inches Weight:      318 pounds BMI:     40.43 BSA:     2.66 Pulse rate:   76 / minute Pulse rhythm:   regular BP sitting:   126 / 84  (right arm) Cuff size:   large  Vitals Entered By: Hortense Ramal CMA Duncan Dull)  (August 21, 2009 8:15 AM)  Physical Exam  General:  alert and   obese.   Eyes:  anicteric Psych:  Alert and cooperative. Normal mood and affect.   Impression & Recommendations:  Problem # 1:  ANEMIA, SECONDARY TO CHRONIC BLOOD LOSS (ICD-280.0) Assessment Improved We know Hgb improved in hospital. Caosue is not clear but may be chronic hemorrhoidal bleeding. ? if he has early XRT proctitis also. I need to see records (requested) from [facility] as he related a hx of a colon polyp this past Fall (none on 07/2009 colonoscopy). He believes he has had an EGD at [facility] also. This could need to be repeated depending upon how long ago that was.  Problem # 2:  HEMORRHOIDS, WITH BLEEDING (ICD-455.8) Assessment: New HC suppositories for now. Has follow-up with Dr. Zachery Dakins and intervention may be needed. Will try to see those records. Orders: TLB-CBC Platelet - w/Differential ([id number])  Problem # 3:  ? of COLONIC POLYPS, HX OF (ICD-V12.72) Assessment: Comment Only awaiting records to confirm  Patient Instructions: 1)  Please go to the basement to have your lab tests drawn today.  2)  Please pick up your medications at your pharmacy.  3)  Please schedule a follow-up appointment in 3 weeks.  4)  Copy sent to : Thomos Lemons, MD, Consuello Bossier, MD 5)  The medication list was reviewed and reconciled.  All changed / newly prescribed medications were explained.  A complete medication list was provided to the patient / caregiver. Prescriptions: ANUSOL-HC 25 MG SUPP (HYDROCORTISONE ACETATE) insert one suppository into rectum at bedtime  #30 x 0   Entered by:   Francee Piccolo CMA (AAMA)   Authorized by:   Iva Boop MD, [facility]   Signed by:   Francee Piccolo CMA (AAMA) on 08/21/2009   Method used:   Electronically to        [facility] [id number] (retail)       [address] [place], [place]  [address]       Ph: [phone]       Fax: [phone]   RxID:    [id number]
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
## 2010-08-28 NOTE — Letter (Signed)
Summary: Triage Call Report Regarding Labs/[facility]  Triage Call Report Regarding Labs/[facility]   Imported By: Lanelle Bal 08/06/2009 11:14:00  _____________________________________________________________________  External Attachment:    Type:   Image     Comment:   External Document

## 2010-08-28 NOTE — Op Note (Signed)
Summary: Shoulder Surgery/[facility]  Shoulder Surgery/[facility]   Imported By: Lanelle Bal 01/22/2010 16:10:03  _____________________________________________________________________  External Attachment:    Type:   Image     Comment:   External Document

## 2010-08-28 NOTE — Letter (Signed)
Summary: [facility]   Imported By: Lanelle Bal 09/24/2008 08:30:45  _____________________________________________________________________  External Attachment:    Type:   Image     Comment:   External Document

## 2010-08-28 NOTE — Letter (Signed)
Summary: [facility]   Imported By: Lanelle Bal 02/24/2009 15:35:56  _____________________________________________________________________  External Attachment:    Type:   Image     Comment:   External Document

## 2010-08-28 NOTE — Medication Information (Signed)
Summary: Diabetic Shoes/[facility], [facility]  Diabetic Shoes/[facility], [place]   Imported By: Lanelle Bal 04/22/2008 12:18:04  _____________________________________________________________________  External Attachment:    Type:   Image     Comment:   External Document

## 2010-08-28 NOTE — Procedures (Signed)
Summary: Colonoscopy/[facility]  Colonoscopy/[facility]   Imported By: Sherian Rein 10/21/2009 08:20:35  _____________________________________________________________________  External Attachment:    Type:   Image     Comment:   External Document

## 2010-08-28 NOTE — Letter (Signed)
Summary: [facility] Surgery   Imported By: Lester Gueydan 08/26/2009 10:06:06  _____________________________________________________________________  External Attachment:    Type:   Image     Comment:   External Document

## 2010-08-28 NOTE — Consult Note (Signed)
Summary: [facility] Surgery   Imported By: Sherian Rein 01/01/2010 14:48:35  _____________________________________________________________________  External Attachment:    Type:   Image     Comment:   External Document

## 2010-08-28 NOTE — Miscellaneous (Signed)
Summary: PT Initial Eval/[facility]  PT Initial Eval/[facility]   Imported By: Lanelle Bal 03/28/2009 12:55:31  _____________________________________________________________________  External Attachment:    Type:   Image     Comment:   External Document

## 2010-08-28 NOTE — Letter (Signed)
Summary: CMN for Diabetic Shoes/[facility]  CMN for Diabetic Shoes/[facility]   Imported By: Lanelle Bal 09/15/2009 11:42:49  _____________________________________________________________________  External Attachment:    Type:   Image     Comment:   External Document

## 2010-08-28 NOTE — Medication Information (Signed)
Summary: Therapeutic Shoes/[facility]  Therapeutic Shoes/[facility]   Imported By: Lanelle Bal 08/06/2009 11:08:50  _____________________________________________________________________  External Attachment:    Type:   Image     Comment:   External Document

## 2010-08-28 NOTE — Letter (Signed)
Summary: Surgical Clearance/[facility]  Surgical Clearance/[facility]   Imported By: Lanelle Bal 05/19/2009 11:26:59  _____________________________________________________________________  External Attachment:    Type:   Image     Comment:   External Document

## 2010-08-28 NOTE — Procedures (Signed)
Summary: Colonoscopy/[facility]  Colonoscopy/[facility]   Imported By: Sherian Rein 10/21/2009 08:19:10  _____________________________________________________________________  External Attachment:    Type:   Image     Comment:   External Document

## 2010-08-28 NOTE — Miscellaneous (Signed)
Summary: Physical Therapy Progress Note/[facility]  Physical Therapy Progress Note/[facility]   Imported By: Maryln Gottron 04/28/2009 14:23:43  _____________________________________________________________________  External Attachment:    Type:   Image     Comment:   External Document

## 2010-08-31 ENCOUNTER — Telehealth: Payer: Self-pay | Admitting: Internal Medicine

## 2010-09-08 ENCOUNTER — Telehealth: Payer: Self-pay | Admitting: Internal Medicine

## 2010-09-10 NOTE — Progress Notes (Signed)
Summary: Endocrinology Referral  Phone Note Call from Patient   Caller: Patient Call For: darlene Reason for Call: Referral Summary of Call: pt wants to start seeing an endocrinologist, he says that he will need dr. Artist Pais to refer. phone# [phone]. please assist. Initial call taken by: Elba Barman,  August 31, 2010 9:53 AM  Follow-up for Phone Call        Left message on machine to return my call. Nicki Guadalajara Fergerson CMA Duncan Dull)  September 01, 2010 3:47 PM   Additional Follow-up for Phone Call Additional follow up Details #1::        call placed to patient at [phone], no answer. A detailed voice message was left for patient to return call regarding referral request. Message was left for patient to return call regarding why he is requesting the referral to endocrinology Additional Follow-up by: Glendell Docker CMA,  September 02, 2010 2:58 PM    Additional Follow-up for Phone Call Additional follow up Details #2::    Left message on machine to return my call. Nicki Guadalajara Fergerson CMA Duncan Dull)  September 03, 2010 2:14 PM   Additional Follow-up for Phone Call Additional follow up Details #3:: Details for Additional Follow-up Action Taken: no return call from patient regarding endocrinology referral Additional Follow-up by: Glendell Docker CMA,  September 04, 2010 11:56 AM

## 2010-09-16 NOTE — Progress Notes (Signed)
Summary: Endocrinology Referral  Phone Note Call from Patient Call back at [phone]   Caller: Patient Call For: D. Thomos Lemons DO Reason for Call: Referral Summary of Call: patient called and left voice message stating he was returning the phone call from last week. His message states that he is requesting a referral to Dr Harrison Mons due to low testosterone and pain he is having. Initial call taken by: Glendell Docker CMA,  September 08, 2010 2:48 PM  Follow-up for Phone Call        call returned to patient at [phone], no answer. A voice message was left for patient to return call, additional information needed.  Follow-up by: Glendell Docker CMA,  September 08, 2010 2:49 PM  Additional Follow-up for Phone Call Additional follow up Details #1::        patient returned call, he is requesting appointment with Dr .Sandi Raveling is endocrinologist. Evaluation for for ED,low testosterone. He states that he has done some internet research and feels like he would benefit from hormone injections. Additional Follow-up by: Glendell Docker CMA,  September 08, 2010 4:31 PM    Additional Follow-up for Phone Call Additional follow up Details #2::    I do not think he is a candidate for testosterone treatment given his history of prostate cancer. endocrinologist will not prescribe testosterone in pt with hx of prostate cancer either Follow-up by: D. Thomos Lemons DO,  September 10, 2010 8:22 AM  Additional Follow-up for Phone Call Additional follow up Details #3:: Details for Additional Follow-up Action Taken: Left message on machine to return my call. Nicki Guadalajara Fergerson CMA (AAMA)  September 10, 2010 8:30 AM   Pt notified. Nicki Guadalajara Fergerson CMA Duncan Dull)  September 10, 2010 9:52 AM

## 2010-10-11 LAB — BASIC METABOLIC PANEL
BUN: 10 mg/dL (ref 6–23)
BUN: 14 mg/dL (ref 6–23)
BUN: 9 mg/dL (ref 6–23)
CO2: 27 mEq/L (ref 19–32)
CO2: 27 mEq/L (ref 19–32)
CO2: 28 mEq/L (ref 19–32)
Calcium: 8.1 mg/dL — ABNORMAL LOW (ref 8.4–10.5)
Calcium: 8.4 mg/dL (ref 8.4–10.5)
Calcium: 8.8 mg/dL (ref 8.4–10.5)
Chloride: 106 mEq/L (ref 96–112)
Chloride: 107 mEq/L (ref 96–112)
Chloride: 109 mEq/L (ref 96–112)
Creatinine, Ser: 0.87 mg/dL (ref 0.4–1.5)
Creatinine, Ser: 0.88 mg/dL (ref 0.4–1.5)
Creatinine, Ser: 0.92 mg/dL (ref 0.4–1.5)
GFR calc Af Amer: >60 mL/min (ref >60)
GFR calc Af Amer: >60 mL/min (ref >60)
GFR calc Af Amer: >60 mL/min (ref >60)
GFR calc non Af Amer: >60 mL/min (ref >60)
GFR calc non Af Amer: >60 mL/min (ref >60)
GFR calc non Af Amer: >60 mL/min (ref >60)
Glucose, Bld: 103 mg/dL — ABNORMAL HIGH (ref 70–99)
Glucose, Bld: 107 mg/dL — ABNORMAL HIGH (ref 70–99)
Glucose, Bld: 95 mg/dL (ref 70–99)
Potassium: 3.7 mEq/L (ref 3.5–5.1)
Potassium: 3.7 mEq/L (ref 3.5–5.1)
Potassium: 4.2 mEq/L (ref 3.5–5.1)
Sodium: 138 mEq/L (ref 135–145)
Sodium: 139 mEq/L (ref 135–145)
Sodium: 144 mEq/L (ref 135–145)

## 2010-10-11 LAB — DIFFERENTIAL
Basophils Absolute: 0 10*3/uL (ref 0.0–0.1)
Basophils Absolute: 0 10*3/uL (ref 0.0–0.1)
Basophils Relative: 0 % (ref 0–1)
Basophils Relative: 0 % (ref 0–1)
Eosinophils Absolute: 0.4 10*3/uL (ref 0.0–0.7)
Eosinophils Absolute: 0.5 10*3/uL (ref 0.0–0.7)
Eosinophils Relative: 7 % — ABNORMAL HIGH (ref 0–5)
Eosinophils Relative: 8 % — ABNORMAL HIGH (ref 0–5)
Lymphocytes Relative: 11 % — ABNORMAL LOW (ref 12–46)
Lymphocytes Relative: 13 % (ref 12–46)
Lymphs Abs: 0.7 10*3/uL (ref 0.7–4.0)
Lymphs Abs: 0.8 10*3/uL (ref 0.7–4.0)
Monocytes Absolute: 0.4 10*3/uL (ref 0.1–1.0)
Monocytes Absolute: 0.5 10*3/uL (ref 0.1–1.0)
Monocytes Relative: 7 % (ref 3–12)
Monocytes Relative: 8 % (ref 3–12)
Neutro Abs: 4.4 10*3/uL (ref 1.7–7.7)
Neutro Abs: 4.6 10*3/uL (ref 1.7–7.7)
Neutrophils Relative %: 73 % (ref 43–77)
Neutrophils Relative %: 73 % (ref 43–77)

## 2010-10-11 LAB — CBC
HCT: 20.8 % — ABNORMAL LOW (ref 39.0–52.0)
HCT: 23.7 % — ABNORMAL LOW (ref 39.0–52.0)
HCT: 24.8 % — ABNORMAL LOW (ref 39.0–52.0)
HCT: 24.9 % — ABNORMAL LOW (ref 39.0–52.0)
Hemoglobin: 6.6 g/dL — CL (ref 13.0–17.0)
Hemoglobin: 7.7 g/dL — ABNORMAL LOW (ref 13.0–17.0)
Hemoglobin: 8.1 g/dL — ABNORMAL LOW (ref 13.0–17.0)
Hemoglobin: 8.3 g/dL — ABNORMAL LOW (ref 13.0–17.0)
MCHC: 32 g/dL (ref 30.0–36.0)
MCHC: 32.6 g/dL (ref 30.0–36.0)
MCHC: 32.7 g/dL (ref 30.0–36.0)
MCHC: 33.3 g/dL (ref 30.0–36.0)
MCV: 76.6 fL — ABNORMAL LOW (ref 78.0–100.0)
MCV: 78 fL (ref 78.0–100.0)
MCV: 78.2 fL (ref 78.0–100.0)
MCV: 78.5 fL (ref 78.0–100.0)
Platelets: 303 10*3/uL (ref 150–400)
Platelets: 312 10*3/uL (ref 150–400)
Platelets: 367 10*3/uL (ref 150–400)
Platelets: 382 10*3/uL (ref 150–400)
RBC: 2.71 MIL/uL — ABNORMAL LOW (ref 4.22–5.81)
RBC: 3.04 MIL/uL — ABNORMAL LOW (ref 4.22–5.81)
RBC: 3.17 MIL/uL — ABNORMAL LOW (ref 4.22–5.81)
RBC: 3.17 MIL/uL — ABNORMAL LOW (ref 4.22–5.81)
RDW: 15.6 % — ABNORMAL HIGH (ref 11.5–15.5)
RDW: 16.1 % — ABNORMAL HIGH (ref 11.5–15.5)
RDW: 16.4 % — ABNORMAL HIGH (ref 11.5–15.5)
RDW: 16.7 % — ABNORMAL HIGH (ref 11.5–15.5)
WBC: 5.4 10*3/uL (ref 4.0–10.5)
WBC: 6 10*3/uL (ref 4.0–10.5)
WBC: 6.3 10*3/uL (ref 4.0–10.5)
WBC: 6.7 10*3/uL (ref 4.0–10.5)

## 2010-10-11 LAB — APTT: aPTT: 29 seconds (ref 24–37)

## 2010-10-11 LAB — CROSSMATCH
ABO/RH(D): O POS
Antibody Screen: NEGATIVE

## 2010-10-11 LAB — GLUCOSE, CAPILLARY
Glucose-Capillary: 100 mg/dL — ABNORMAL HIGH (ref 70–99)
Glucose-Capillary: 102 mg/dL — ABNORMAL HIGH (ref 70–99)
Glucose-Capillary: 108 mg/dL — ABNORMAL HIGH (ref 70–99)
Glucose-Capillary: 112 mg/dL — ABNORMAL HIGH (ref 70–99)
Glucose-Capillary: 77 mg/dL (ref 70–99)
Glucose-Capillary: 81 mg/dL (ref 70–99)
Glucose-Capillary: 82 mg/dL (ref 70–99)
Glucose-Capillary: 82 mg/dL (ref 70–99)
Glucose-Capillary: 83 mg/dL (ref 70–99)
Glucose-Capillary: 84 mg/dL (ref 70–99)
Glucose-Capillary: 84 mg/dL (ref 70–99)
Glucose-Capillary: 88 mg/dL (ref 70–99)
Glucose-Capillary: 88 mg/dL (ref 70–99)
Glucose-Capillary: 89 mg/dL (ref 70–99)
Glucose-Capillary: 92 mg/dL (ref 70–99)
Glucose-Capillary: 93 mg/dL (ref 70–99)
Glucose-Capillary: 94 mg/dL (ref 70–99)
Glucose-Capillary: 98 mg/dL (ref 70–99)

## 2010-10-11 LAB — RETICULOCYTES
RBC.: 3.33 MIL/uL — ABNORMAL LOW (ref 4.22–5.81)
Retic Count, Absolute: 89.9 10*3/uL (ref 19.0–186.0)
Retic Ct Pct: 2.7 % (ref 0.4–3.1)

## 2010-10-11 LAB — HEMOGLOBIN A1C
Hgb A1c MFr Bld: 5.3 % (ref 4.6–6.1)
Mean Plasma Glucose: 105 mg/dL

## 2010-10-11 LAB — CEA: CEA: 0.6 ng/mL (ref 0.0–5.0)

## 2010-10-11 LAB — PROTIME-INR
INR: 1.04 (ref 0.00–1.49)
Prothrombin Time: 13.5 seconds (ref 11.6–15.2)

## 2010-10-11 LAB — VITAMIN B12: Vitamin B-12: 570 pg/mL (ref 211–911)

## 2010-10-11 LAB — IRON AND TIBC
Iron: 69 ug/dL (ref 42–135)
Saturation Ratios: 19 % — ABNORMAL LOW (ref 20–55)
TIBC: 364 ug/dL (ref 215–435)
UIBC: 295 ug/dL

## 2010-10-11 LAB — HEMOGLOBIN AND HEMATOCRIT, BLOOD
HCT: 25 % — ABNORMAL LOW (ref 39.0–52.0)
Hemoglobin: 8.2 g/dL — ABNORMAL LOW (ref 13.0–17.0)

## 2010-10-11 LAB — ABO/RH: ABO/RH(D): O POS

## 2010-10-11 LAB — FERRITIN: Ferritin: 14 ng/mL — ABNORMAL LOW (ref 22–322)

## 2010-10-11 LAB — PREPARE RBC (CROSSMATCH)

## 2010-10-11 LAB — FOLATE: Folate: 16.6 ng/mL

## 2010-10-12 LAB — GLUCOSE, CAPILLARY
Glucose-Capillary: 107 mg/dL — ABNORMAL HIGH (ref 70–99)
Glucose-Capillary: 115 mg/dL — ABNORMAL HIGH (ref 70–99)
Glucose-Capillary: 118 mg/dL — ABNORMAL HIGH (ref 70–99)
Glucose-Capillary: 135 mg/dL — ABNORMAL HIGH (ref 70–99)
Glucose-Capillary: 80 mg/dL (ref 70–99)
Glucose-Capillary: 97 mg/dL (ref 70–99)

## 2010-10-12 LAB — CBC
HCT: 40.9 % (ref 39.0–52.0)
HCT: 42.3 % (ref 39.0–52.0)
Hemoglobin: 13.2 g/dL (ref 13.0–17.0)
Hemoglobin: 13.8 g/dL (ref 13.0–17.0)
MCHC: 32.3 g/dL (ref 30.0–36.0)
MCHC: 32.5 g/dL (ref 30.0–36.0)
MCV: 81.7 fL (ref 78.0–100.0)
MCV: 82.3 fL (ref 78.0–100.0)
Platelets: 236 10*3/uL (ref 150–400)
Platelets: 236 10*3/uL (ref 150–400)
RBC: 4.97 MIL/uL (ref 4.22–5.81)
RBC: 5.18 MIL/uL (ref 4.22–5.81)
RDW: 15.6 % — ABNORMAL HIGH (ref 11.5–15.5)
RDW: 16.3 % — ABNORMAL HIGH (ref 11.5–15.5)
WBC: 6.8 10*3/uL (ref 4.0–10.5)
WBC: 9.8 10*3/uL (ref 4.0–10.5)

## 2010-10-12 LAB — DIFFERENTIAL
Basophils Absolute: 0 10*3/uL (ref 0.0–0.1)
Basophils Relative: 0 % (ref 0–1)
Eosinophils Absolute: 0.2 10*3/uL (ref 0.0–0.7)
Eosinophils Relative: 2 % (ref 0–5)
Lymphocytes Relative: 14 % (ref 12–46)
Lymphs Abs: 0.9 10*3/uL (ref 0.7–4.0)
Monocytes Absolute: 0.5 10*3/uL (ref 0.1–1.0)
Monocytes Relative: 8 % (ref 3–12)
Neutro Abs: 5.2 10*3/uL (ref 1.7–7.7)
Neutrophils Relative %: 76 % (ref 43–77)

## 2010-10-12 LAB — COMPREHENSIVE METABOLIC PANEL
ALT: 25 U/L (ref 0–53)
AST: 21 U/L (ref 0–37)
Albumin: 4 g/dL (ref 3.5–5.2)
Alkaline Phosphatase: 58 U/L (ref 39–117)
BUN: 13 mg/dL (ref 6–23)
CO2: 30 mEq/L (ref 19–32)
Calcium: 9.1 mg/dL (ref 8.4–10.5)
Chloride: 109 mEq/L (ref 96–112)
Creatinine, Ser: 1.05 mg/dL (ref 0.4–1.5)
GFR calc Af Amer: >60 mL/min (ref >60)
GFR calc non Af Amer: >60 mL/min (ref >60)
Glucose, Bld: 94 mg/dL (ref 70–99)
Potassium: 4.3 mEq/L (ref 3.5–5.1)
Sodium: 144 mEq/L (ref 135–145)
Total Bilirubin: 0.8 mg/dL (ref 0.3–1.2)
Total Protein: 7.5 g/dL (ref 6.0–8.3)

## 2010-10-14 LAB — GLUCOSE, CAPILLARY
Glucose-Capillary: 108 mg/dL — ABNORMAL HIGH (ref 70–99)
Glucose-Capillary: 95 mg/dL (ref 70–99)

## 2010-10-18 LAB — BASIC METABOLIC PANEL
BUN: 13 mg/dL (ref 6–23)
CO2: 31 mEq/L (ref 19–32)
Calcium: 8.8 mg/dL (ref 8.4–10.5)
Chloride: 106 mEq/L (ref 96–112)
Creatinine, Ser: 0.82 mg/dL (ref 0.4–1.5)
GFR calc Af Amer: >60 mL/min (ref >60)
GFR calc non Af Amer: >60 mL/min (ref >60)
Glucose, Bld: 90 mg/dL (ref 70–99)
Potassium: 4.1 mEq/L (ref 3.5–5.1)
Sodium: 142 mEq/L (ref 135–145)

## 2010-10-18 LAB — GLUCOSE, CAPILLARY
Glucose-Capillary: 104 mg/dL — ABNORMAL HIGH (ref 70–99)
Glucose-Capillary: 84 mg/dL (ref 70–99)

## 2010-10-18 LAB — POCT HEMOGLOBIN-HEMACUE: Hemoglobin: 13.5 g/dL (ref 13.0–17.0)

## 2010-10-23 ENCOUNTER — Ambulatory Visit: Payer: Self-pay | Admitting: Internal Medicine

## 2010-10-26 ENCOUNTER — Ambulatory Visit (INDEPENDENT_AMBULATORY_CARE_PROVIDER_SITE_OTHER): Payer: Medicare Other | Admitting: Internal Medicine

## 2010-10-26 ENCOUNTER — Encounter: Payer: Self-pay | Admitting: Internal Medicine

## 2010-10-26 VITALS — BP 124/60 | HR 84 | Temp 98.4°F | Resp 22 | Ht 74.5 in | Wt 323.0 lb

## 2010-10-26 DIAGNOSIS — R609 Edema, unspecified: Secondary | ICD-10-CM

## 2010-10-26 DIAGNOSIS — I10 Essential (primary) hypertension: Secondary | ICD-10-CM

## 2010-10-26 DIAGNOSIS — E785 Hyperlipidemia, unspecified: Secondary | ICD-10-CM

## 2010-10-26 DIAGNOSIS — R221 Localized swelling, mass and lump, neck: Secondary | ICD-10-CM

## 2010-10-26 DIAGNOSIS — R220 Localized swelling, mass and lump, head: Secondary | ICD-10-CM

## 2010-10-26 DIAGNOSIS — E119 Type 2 diabetes mellitus without complications: Secondary | ICD-10-CM

## 2010-10-26 LAB — LIPID PANEL
Cholesterol: 202 mg/dL — ABNORMAL HIGH (ref 0–200)
HDL: 50 mg/dL (ref >39)
LDL Cholesterol: 110 mg/dL — ABNORMAL HIGH (ref 0–99)
Total CHOL/HDL Ratio: 4 Ratio
Triglycerides: 210 mg/dL — ABNORMAL HIGH (ref <150)
VLDL: 42 mg/dL — ABNORMAL HIGH (ref 0–40)

## 2010-10-26 LAB — HEMOGLOBIN A1C
Hgb A1c MFr Bld: 6.4 % — ABNORMAL HIGH (ref <5.7)
Mean Plasma Glucose: 137 mg/dL — ABNORMAL HIGH (ref <117)

## 2010-10-26 NOTE — Progress Notes (Signed)
  Subjective:    Patient ID: Christopher Riley, male    DOB: 04-11-1951, 60 y.o.   MRN: [id number]  HPI  60 y/o male with DM II, htn, and chronic low back pain for follow up.  Pt prev complained about fatigue and ED.   He was interested in testosterone tx but he can not use because of hx of prostate ca.    He reports lump on right lower neck.  He thinks lump getting larger.  He is not sure if related to intermittent neck pain.    DM II - stable  Htn - stable  Review of Systems Mild wt gain.  No chest pain.  No SOB  Past Medical History  Diagnosis Date  . Post traumatic stress disorder (PTSD)   . DM II (diabetes mellitus, type II), controlled   . Hypertension   . Prostate cancer     S/P radioactive seed tx  (Dr. Brunilda Payor)   . Colon polyps      adenomatous colon polyps 2008 and 2010 [facility]  . Erectile dysfunction   . Right retinal detachment   . Sleep apnea   . Hemorrhoids   . Glaucoma     History   Social History  . Marital Status: Married    Spouse Name: N/A    Number of Children: N/A  . Years of Education: N/A   Occupational History  . Retired     Former Systems developer History Main Topics  . Smoking status: Former Games developer  . Smokeless tobacco: Not on file  . Alcohol Use: No  . Drug Use: No  . Sexually Active: Not on file   Other Topics Concern  . Not on file   Social History Narrative  . No narrative on file    Past Surgical History  Procedure Date  . Right knee replacement     1988  . Left knee surgery     [facility]  . Right shoulder arthroscopic surgery 06/2009    (Dr.  Cleophas Dunker)  . Transurethral resection of prostate 10/2005  . Cataract extraction     Family History  Problem Relation Age of Onset  . Diabetes Brother     No Known Allergies    BP 124/60  Pulse 84  Temp(Src) 98.4 F (36.9 C) (Oral)  Resp 22  Ht 6' 2.5" (1.892 m)  Wt 323 lb (146.512 kg)  BMI 40.92 kg/m2  SpO2 98%      Objective:   Physical Exam  Constitutional: He  appears well-developed and well-nourished. No distress.  HENT:  Head: Normocephalic and atraumatic.  Right Ear: External ear normal.  Left Ear: External ear normal.  Mouth/Throat: Oropharynx is clear and moist.  Eyes: Pupils are equal, round, and reactive to light.  Neck: Normal range of motion. Neck supple.         1-2 cm rubbery lymph node left posterior cervical chain  Lymphadenopathy:    He has cervical adenopathy.          Assessment & Plan:

## 2010-10-26 NOTE — Patient Instructions (Signed)
Our office will contact you re:  Scheduling CT of Neck

## 2010-10-27 ENCOUNTER — Ambulatory Visit (HOSPITAL_BASED_OUTPATIENT_CLINIC_OR_DEPARTMENT_OTHER)
Admission: RE | Admit: 2010-10-27 | Discharge: 2010-10-27 | Disposition: A | Discharge status: Home / Self Care | Payer: Medicare Other | Source: Ambulatory Visit | Attending: Internal Medicine | Admitting: Internal Medicine

## 2010-10-27 DIAGNOSIS — M47812 Spondylosis without myelopathy or radiculopathy, cervical region: Secondary | ICD-10-CM | POA: Insufficient documentation

## 2010-10-27 DIAGNOSIS — M4802 Spinal stenosis, cervical region: Secondary | ICD-10-CM | POA: Insufficient documentation

## 2010-10-27 DIAGNOSIS — R220 Localized swelling, mass and lump, head: Secondary | ICD-10-CM | POA: Insufficient documentation

## 2010-10-27 DIAGNOSIS — R51 Headache: Secondary | ICD-10-CM | POA: Insufficient documentation

## 2010-10-27 DIAGNOSIS — R221 Localized swelling, mass and lump, neck: Secondary | ICD-10-CM | POA: Insufficient documentation

## 2010-10-27 LAB — CBC
HCT: 30.2 % — ABNORMAL LOW (ref 39.0–52.0)
Hemoglobin: 10 g/dL — ABNORMAL LOW (ref 13.0–17.0)
MCHC: 33 g/dL (ref 30.0–36.0)
MCV: 83.3 fL (ref 78.0–100.0)
Platelets: 337 10*3/uL (ref 150–400)
RBC: 3.63 MIL/uL — ABNORMAL LOW (ref 4.22–5.81)
RDW: 13.8 % (ref 11.5–15.5)
WBC: 5.3 10*3/uL (ref 4.0–10.5)

## 2010-10-27 LAB — COMPREHENSIVE METABOLIC PANEL
ALT: 19 U/L (ref 0–53)
AST: 21 U/L (ref 0–37)
Albumin: 3.8 g/dL (ref 3.5–5.2)
Alkaline Phosphatase: 52 U/L (ref 39–117)
BUN: 9 mg/dL (ref 6–23)
CO2: 29 mEq/L (ref 19–32)
Calcium: 8.8 mg/dL (ref 8.4–10.5)
Chloride: 106 mEq/L (ref 96–112)
Creatinine, Ser: 0.87 mg/dL (ref 0.4–1.5)
GFR calc Af Amer: >60 mL/min (ref >60)
GFR calc non Af Amer: >60 mL/min (ref >60)
Glucose, Bld: 89 mg/dL (ref 70–99)
Potassium: 4 mEq/L (ref 3.5–5.1)
Sodium: 138 mEq/L (ref 135–145)
Total Bilirubin: 0.2 mg/dL — ABNORMAL LOW (ref 0.3–1.2)
Total Protein: 6.4 g/dL (ref 6.0–8.3)

## 2010-10-27 LAB — BASIC METABOLIC PANEL WITH GFR
BUN: 15 mg/dL (ref 6–23)
CO2: 27 mEq/L (ref 19–32)
Calcium: 9.6 mg/dL (ref 8.4–10.5)
Chloride: 103 mEq/L (ref 96–112)
Creat: 0.94 mg/dL (ref 0.40–1.50)
GFR, Est African American: >60 mL/min (ref >60)
GFR, Est Non African American: >60 mL/min (ref >60)
Glucose, Bld: 109 mg/dL — ABNORMAL HIGH (ref 70–99)
Potassium: 4.5 mEq/L (ref 3.5–5.3)
Sodium: 139 mEq/L (ref 135–145)

## 2010-10-27 LAB — URINALYSIS, ROUTINE W REFLEX MICROSCOPIC
Bilirubin Urine: NEGATIVE
Glucose, UA: NEGATIVE mg/dL
Hgb urine dipstick: NEGATIVE
Ketones, ur: NEGATIVE mg/dL
Nitrite: NEGATIVE
Protein, ur: NEGATIVE mg/dL
Specific Gravity, Urine: 1.024 (ref 1.005–1.030)
Urobilinogen, UA: 0.2 mg/dL (ref 0.0–1.0)
pH: 5.5 (ref 5.0–8.0)

## 2010-10-27 LAB — PROTIME-INR
INR: 0.99 (ref 0.00–1.49)
Prothrombin Time: 13 seconds (ref 11.6–15.2)

## 2010-10-27 LAB — BRAIN NATRIURETIC PEPTIDE: Brain Natriuretic Peptide: 31.1 pg/mL (ref 0.0–100.0)

## 2010-10-27 LAB — GLUCOSE, CAPILLARY
Glucose-Capillary: 92 mg/dL (ref 70–99)
Glucose-Capillary: 99 mg/dL (ref 70–99)

## 2010-10-27 LAB — APTT: aPTT: 30 seconds (ref 24–37)

## 2010-10-27 MED ORDER — IOHEXOL 300 MG/ML  SOLN
75.0000 mL | Freq: Once | INTRAMUSCULAR | Status: AC | PRN
  Administered 2010-10-27: 75 mL via INTRAVENOUS

## 2010-10-28 ENCOUNTER — Encounter: Payer: Self-pay | Admitting: Internal Medicine

## 2010-10-28 DIAGNOSIS — R221 Localized swelling, mass and lump, neck: Secondary | ICD-10-CM | POA: Insufficient documentation

## 2010-10-28 NOTE — Assessment & Plan Note (Signed)
Stable.  Continue current medication regimen.  Lab Results  Component Value Date   HGBA1C 6.4* 10/26/2010   Lab Results  Component Value Date   CREATININE 0.94 10/26/2010

## 2010-10-28 NOTE — Assessment & Plan Note (Addendum)
BP at goal.    BP Readings from Last 3 Encounters:  10/26/10 124/60  06/30/10 130/80  04/02/10 134/80

## 2010-10-28 NOTE — Assessment & Plan Note (Signed)
Right lower neck mass.  Rule out malignancy CT of Neck

## 2010-10-29 ENCOUNTER — Telehealth: Payer: Self-pay | Admitting: Internal Medicine

## 2010-10-29 NOTE — Telephone Encounter (Signed)
Pt called wanting CT scan results.

## 2010-10-29 NOTE — Telephone Encounter (Signed)
LMOAM re:  CT of neck results

## 2010-11-12 ENCOUNTER — Ambulatory Visit: Payer: Medicare Other | Admitting: Internal Medicine

## 2010-11-18 ENCOUNTER — Encounter: Payer: Self-pay | Admitting: Internal Medicine

## 2010-11-20 ENCOUNTER — Encounter: Payer: Self-pay | Admitting: Internal Medicine

## 2010-11-20 ENCOUNTER — Ambulatory Visit (INDEPENDENT_AMBULATORY_CARE_PROVIDER_SITE_OTHER): Payer: Medicare Other | Admitting: Internal Medicine

## 2010-11-20 VITALS — BP 140/80 | HR 79 | Temp 98.9°F | Resp 20 | Wt 318.0 lb

## 2010-11-20 DIAGNOSIS — G8929 Other chronic pain: Secondary | ICD-10-CM

## 2010-11-20 DIAGNOSIS — M542 Cervicalgia: Secondary | ICD-10-CM

## 2010-11-20 NOTE — Patient Instructions (Signed)
Please complete the following lab tests before your next follow up appointment: BMET - 401.9 A1c - 250.00 FLP, LFTs - 272.4 

## 2010-12-07 ENCOUNTER — Ambulatory Visit: Payer: Medicare Other
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
## 2010-12-11 ENCOUNTER — Ambulatory Visit: Payer: Medicare Other | Attending: Internal Medicine | Admitting: Physical Therapy

## 2010-12-11 DIAGNOSIS — M6281 Muscle weakness (generalized): Secondary | ICD-10-CM | POA: Insufficient documentation

## 2010-12-11 DIAGNOSIS — M2560 Stiffness of unspecified joint, not elsewhere classified: Secondary | ICD-10-CM | POA: Insufficient documentation

## 2010-12-11 DIAGNOSIS — R293 Abnormal posture: Secondary | ICD-10-CM | POA: Insufficient documentation

## 2010-12-11 DIAGNOSIS — M542 Cervicalgia: Secondary | ICD-10-CM | POA: Insufficient documentation

## 2010-12-11 DIAGNOSIS — IMO0001 Reserved for inherently not codable concepts without codable children: Secondary | ICD-10-CM | POA: Insufficient documentation

## 2010-12-11 NOTE — Consult Note (Signed)
NAME:  Christopher Riley, Christopher Riley                        ACCOUNT NO.:  [id number]   MEDICAL RECORD NO.:  [id number]                   PATIENT TYPE:  EMS   LOCATION:  MAJO                                 FACILITY:  [facility]   PHYSICIAN:  Hollice Espy, M.D.            DATE OF BIRTH:  08-17-1950   DATE OF CONSULTATION:  DATE OF DISCHARGE:                                   CONSULTATION   CHIEF COMPLAINT:  Rectal bleeding.   Christopher Riley is a 60 year old African-American male who has been seen at the  [place].  His PCP is Dr. Idell Pickles.  The patient has a history of hemorrhoid surgery  a little over a week ago and since that time has been relatively stable.  He  has had problems with constipation and was given a prescription for Senokot  and docusate; however, he did not get these filled until later on and was  not able to actually take these medications and prevent his constipation in  time.  He did not have a bowel movement for quite a few days and then had a  large bowel movement early this morning.  What was concerning was that he  started having a large amount of bright red blood from his rectum.  He said  it filled the bowl, was on the floor s well.  He came into the ER and was  found to be stable upon his arrival.  He was not orthostatic or tachycardic.  His temperature was 97, pulse 94, respirations 20, his blood pressure was  151/68.  It was noted that he had a moderate amount of bright red blood in  his rectum.  This stabilized on it own and the patient did not require a  transfusion.  His labs were checked.  He had a BUN and creatinine of 12 and  1.1, indicating he was not volume-depleted or dehydrated.  It was noted,  however, that he did have a UTI with a moderate amount of leukocyte esterase  and 11-20 white cells.  He had an elevated white count of 14.9 with a shift  of 82%.  His H&H was stable at 12.2 and 36.3 and was on the low end of  normal, his MCV of 81, platelet count of 277.  He  reported that he has had a  few episodes of bleeding since his hemorrhoid surgery, but this one was the  major one.  He also had some pain.  He received some pain medication and  some IV fluids here in the ER.  He was feeling much better approximately six  hours since his arrival and at this time he has had no further bleeding  episodes since.   PHYSICAL EXAMINATION:  GENERAL:  On examination of the patient, he is not  orthostatic.  He is alert and oriented x3 and in no apparent distress.  CARDIAC:  Heart is regular rate and rhythm.  CHEST:  His lungs are clear to auscultation bilaterally.  ABDOMEN:  Soft, nontender, nondistended, positive bowel sounds.  EXTREMITIES:  No clubbing, cyanosis, or edema.  ABDOMEN:  Benign.  RECTAL:  His rectum shows evidence of some ligated hemorrhoids and no  evidence of any acute rectal bleeding.  In fact, he had just some dried  blood present.   Given this history, therefore, I am comfortable with discharging Christopher Riley  from the ER.  He already has prescription medications for docusate and  Senokot, and I recommended that he take these upon discharge home along with  the rest of his other medications, p.r.n. morphine sulfate for pain,  advising him that the morphine pills will probably cause some constipation.  Paroxetine for his depression, Lamisil for infection, as well as diazepam  for anxiety.  In addition, I am giving him a prescription for Bactrim DS one  p.o. b.i.d. x7 days for his urinary tract infection.  He does not appear  septic and therefore can be treated for this infection as an outpatient.  I  advised the patient that he have further episodes of persistent of heavy  bleeding, to immediately call his surgeon who performed  the surgery.  If the patient cannot be seen that same day, then I advised  him to return to the closest ER for further assistant.  The patient states  he already has a scheduled appointment with his hemorrhoid surgeon  in  approximately two weeks.                                               Hollice Espy, M.D.    SKK/MEDQ  D:  07/12/2003  T:  07/12/2003  Job:  [id number]   cc:   Dellis Anes. Idell Pickles, M.D.  [address]  [place]  [place] [address]  Fax: [phone]

## 2010-12-11 NOTE — Op Note (Signed)
NAME:  Christopher Riley, Christopher Riley              ACCOUNT NO.:  [id number]   MEDICAL RECORD NO.:  [id number]          PATIENT TYPE:  OIB   LOCATION:  1610                         FACILITY:  [facility]   PHYSICIAN:  Lindaann Slough, M.D.  DATE OF BIRTH:  [birth year]   DATE OF PROCEDURE:  11/05/2005  DATE OF DISCHARGE:                                 OPERATIVE REPORT   PREOPERATIVE DIAGNOSIS:  Benign prostatic hypertrophy.   POSTOPERATIVE DIAGNOSIS:  Benign prostatic hypertrophy.   PROCEDURE:  TURP, cystoscopy.   SURGEON:  Danae Chen, M.D.   ASSISTANT:  Cornelious Bryant, M.D.   ANESTHESIA:  General.   SPECIMENS:  Prostatic chips.   ESTIMATED BLOOD LOSS:  50 cc.   INDICATIONS FOR PROCEDURE:  This is a 60 year old African-American gentleman  with increasing symptoms of frequency, hesitancy, and decreasing stream.  The patient had one episode of urinary retention in January of 2007.  A  trial of terazosin did not relieve his symptoms.  After excessive  counseling, the patient elected for surgical resection of his prostate.   DESCRIPTION OF PROCEDURE:  The patient was brought to the operating room.  Time out was taken to identify patient.  Preoperative antibiotics were  given.  General anesthesia was induced, and the patient was placed in the  dorsal lithotomy position.  He was prepped and draped in the normal sterile  fashion.   The patient was initially cystoscoped with a 22 French sheath cystoscope.  His  urethral mucosa was devoid of any lacerations or stones.  He was noted  to have a large median lobe with moderate hypertrophy of the lateral lobes.  Upon entering the bladder, the bladder was noted to be moderately  trabeculated.  There was no evidence of mass, lesions, or stones.  Both  ureteral orifices were identified and were refluxing urine normally.  The  scope was then removed, and the resectoscope was then inserted after the  patient was serially dilated with sounds up to 30 [place].   Both ureteral  orifices were marked and then resection started by resecting the median lobe  of the prostate from the level of the bladder neck after the level of the  verumontanum.  During the resection, hemostasis was obtained by coagulating  the bleeding vessels.  Resection was then continued by dissecting the left  lobe and then the right lobe of the prostate.  Resection was carried down to  the level of the capsule.  Again, hemostasis was adequately obtained by  coagulation as needed.  There was some prostatic tissue in the anterior  aspect of the prostatic urethra.  This was also resected and hemostasis  obtained.  After extensive resection, hemostasis was observed, and parking  the scope at the level of the verumontanum, the bladder neck region was  widely patent.  Care was taken not to be beyond the verumontanum during that  process.  The chips were then evacuated out.  Further inspection of the  prostatic urethra did not reveal any active bleeding.  The scope was then  removed, and a 24 [place] three-way catheter was then inserted.  60 cc of  water were then instilled into the balloon.  The catheter was irrigated  multiple times using a Tumey syringe.  The fluid coming back was noted to be  clear.  The catheter was then connected to regular drainage.   The patient was awakened from anesthesia and extubated in the operating  room.  There were no complications.  The patient was taken in stable  condition to the PACU.  Please note that Dr. Brunilda Payor was present and  participated in the entire procedure, as he was the primary surgeon.     ______________________________  Cornelious Bryant, MD      Lindaann Slough, M.D.  Electronically Signed    SK/MEDQ  D:  11/05/2005  T:  11/05/2005  Job:  [id number]

## 2010-12-11 NOTE — Discharge Summary (Signed)
NAME:  Christopher Riley, Christopher Riley              ACCOUNT NO.:  [id number]   MEDICAL RECORD NO.:  [id number]          PATIENT TYPE:  INP   LOCATION:  1419                         FACILITY:  [facility]   PHYSICIAN:  Lindaann Slough, M.D.  DATE OF BIRTH:  11-06-1950   DATE OF ADMISSION:  11/05/2005  DATE OF DISCHARGE:  11/07/2005                                 DISCHARGE SUMMARY   DISCHARGE DIAGNOSES:  1.  Benign prostatic hypertrophy.  2.  Urinary retention.  3.  Bladder outlet obstruction.  4.  Erectile dysfunction.  5.  Hypertension.   PROCEDURES:  Cystoscopy and TURP on November 05, 2005.   The patient is a 60 years old male who had been having increasing symptoms  of frequency, hesitancy and decreased force of urinary stream. He had one  episode of urinary retention in January 2007. He had been on Hytrin and he  continued to have the same symptoms. Cystoscopy showed a trilobar prostatic  hypertrophy. The patient was admitted on November 05, 2005 for TURP.   PHYSICAL EXAMINATION:  VITAL SIGNS:  Blood pressure was 131/76, pulse 83,  respiration 20, temperature 97.6.  HEENT:  Head is normal.  LUNGS:  Clear.  HEART:  Regular rhythm.  ABDOMEN:  Soft and nondistended, nontender. He had no CVA tenderness.  Kidneys are not palpable. Bowel sounds normal.  GENITOURINARY:  Penis and scrotal contents are within normal limits.  RECTAL:  Sphincter tone is normal. Prostate is enlarged at 40 gm. No  nodules. Seminal vesicles not palpable.   LABORATORY DATA:  Hemoglobin on admission was 13.8, hematocrit 43.4, WBC  6.2.  Sodium 141, potassium 3.6, BUN 13, creatinine 1.0, glucose 75.  Urinalysis showed 0.2 mg of urobilinogen. Otherwise negative. Urine culture  is negative. EKG is normal. Chest x-ray showed no evidence of active  disease.   The patient had cystoscopy and TURP on November 05, 2005. Postoperative course  is uneventful. He tolerated his diet well and the Foley catheter was removed  on the first day  postoperatively. After removing the Foley, he was voiding  well. He had a good flow. He was then discharged home on November 07, 2005 on  Cipro 250 mg twice a day, Colace 100 mg twice a day, Percocet 5/325 mg 2  tablets every four hours as needed for pain, methadone 10 mg as needed  paroxetine 40 mg as needed, gabapentin 400 mg.   CONDITION ON DISCHARGE:  Improved.   DIET:  Regular.   DISCHARGE INSTRUCTIONS:  The patient was instructed not to do any lifting,  straining or driving until further advised. He will be followed in the  office in three weeks.      Lindaann Slough, M.D.  Electronically Signed     MN/MEDQ  D:  11/12/2005  T:  11/13/2005  Job:  [id number]

## 2010-12-11 NOTE — H&P (Signed)
NAME:  Christopher Riley, CLOCK NO.:  [id number]   MEDICAL RECORD NO.:  [id number]          PATIENT TYPE:  AMB   LOCATION:  DAY                          FACILITY:  [facility]   PHYSICIAN:  Lindaann Slough, M.D.  DATE OF BIRTH:  [birth year]   DATE OF ADMISSION:  11/05/2005  DATE OF DISCHARGE:                                HISTORY & PHYSICAL   CHIEF COMPLAINT:  Frequency, hesitancy, decreased force of urinary stream.   HISTORY OF PRESENT ILLNESS:  The patient is a 60 year old male who has been  having increasing symptoms of frequency, hesitancy, decreased force of the  urinary stream.  He had 1episode of urinary retention in January 2007.  Prior to this episode of urinary retention, he had been on Hytrin.  Hytrin  was continued; however, he continues to have frequency, hesitancy and  decreased stream, and the patient felt that he was not voiding  satisfactorily and he wanted to have a TURP.  He is admitted today for the  procedure.   PAST MEDICAL HISTORY:  Past medical history is positive for:  1.  History of elevated PSA.  2.  Hypertension.  3.  Irritable bowel.   MEDICATIONS:  1.  Terazosin 10 mg h.s.  2.  Methadone 10 mg p.r.n.  3.  Paroxetine 40 mg p.r.n.  4.  Gabapentin 400 mg.  5.  Oxybutynin 5 mg p.r.n.   ALLERGIES:  No known drug allergies.   PAST SURGICAL HISTORY:  He had knee replacement in 1998.   SOCIAL HISTORY:  He is married, does not smoke nor drink.   FAMILY HISTORY:  Family history is negative for hypertension and diabetes.   REVIEW OF SYSTEMS:  He has frequency, urgency, hesitancy, decreased force of  the urinary stream and a history of urinary retention.  He also has  difficulty with erections.  He has shortness of breath, chest pain at times  and skin rash and all others are negative.   PHYSICAL EXAMINATION:  GENERAL:  This is a 60 year old male who walks with a  cane because of chronic knee and back pain.  VITAL SIGNS:  Blood pressure is  131/76, pulse 83, respirations 20,  temperature 97.6.  SKIN:  Warm and dry.  HEENT:  Head is normal and he has pink conjunctivae.  Ears, nose and throat  are within normal limits.  NECK:  Supple and he has no cervical adenopathy and no thyromegaly.  LUNGS:  Clear.  HEART:  Regular rhythm.  ABDOMEN:  Soft, non-distended and nontender.  He has no CVA tenderness.  Kidneys are not palpable.  He has no hepatomegaly and no splenomegaly.  Bladder is not distended.  He has no inguinal hernia.  Bowel sounds are  normal.  GENITALIA:  Penis and scrotal contents are within normal limits.  He has no  testicular mass.  Cords and epididymes are within normal limits.  RECTAL:  Sphincter tone is normal.  Prostate is enlarged, 40 g, no nodules.  Seminal vesicles are not palpable.   IMPRESSION:  1.  Benign prostatic hypertrophy.  2.  Bladder outlet obstruction.  3.  Erectile dysfunction.  4.  History of elevated prostate-specific antigen with 3 negative prostate      biopsies.      Lindaann Slough, M.D.  Electronically Signed     MN/MEDQ  D:  11/05/2005  T:  11/05/2005  Job:  [id number]

## 2010-12-15 ENCOUNTER — Ambulatory Visit: Payer: Medicare Other | Admitting: Rehabilitative and Restorative Service Providers"

## 2010-12-18 ENCOUNTER — Ambulatory Visit: Payer: Medicare Other | Admitting: Rehabilitative and Restorative Service Providers"
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
## 2010-12-20 NOTE — Assessment & Plan Note (Signed)
Pt has chronic neck pain.  Pt was worried about small lump at base of neck.  CT of neck showed:   Where a vitamin E capsule was placed along the posterior right neck at the mandible level where the patient has a perceived palpable abnormality, no mass Is identified.  No adenopathy.  Dental and motion artifact limits evaluation. No neck mass identified.  Cervical spondylotic changes with spinal stenosis and cord flattening C4-5.  Medial deviation of the carotid arteries causes slight impression upon the posterior aspect of the pharynx.  Trial of PT.

## 2010-12-23 ENCOUNTER — Ambulatory Visit: Payer: Medicare Other | Admitting: Physical Therapy

## 2010-12-25 ENCOUNTER — Ambulatory Visit: Payer: Medicare Other | Admitting: Physical Therapy

## 2010-12-29 ENCOUNTER — Encounter: Payer: Medicare Other | Admitting: Physical Therapy

## 2010-12-31 ENCOUNTER — Encounter: Payer: Medicare Other | Admitting: Physical Therapy

## 2011-01-04 ENCOUNTER — Encounter: Payer: Medicare Other | Admitting: Physical Therapy

## 2011-01-07 ENCOUNTER — Ambulatory Visit: Payer: Medicare Other | Attending: Internal Medicine | Admitting: Physical Therapy

## 2011-01-07 DIAGNOSIS — M542 Cervicalgia: Secondary | ICD-10-CM | POA: Insufficient documentation

## 2011-01-07 DIAGNOSIS — M2560 Stiffness of unspecified joint, not elsewhere classified: Secondary | ICD-10-CM | POA: Insufficient documentation

## 2011-01-07 DIAGNOSIS — M6281 Muscle weakness (generalized): Secondary | ICD-10-CM | POA: Insufficient documentation

## 2011-01-07 DIAGNOSIS — R293 Abnormal posture: Secondary | ICD-10-CM | POA: Insufficient documentation

## 2011-01-07 DIAGNOSIS — IMO0001 Reserved for inherently not codable concepts without codable children: Secondary | ICD-10-CM | POA: Insufficient documentation

## 2011-01-13 ENCOUNTER — Ambulatory Visit: Payer: Medicare Other | Admitting: Physical Therapy

## 2011-01-25 ENCOUNTER — Encounter: Payer: Medicare Other | Admitting: Rehabilitative and Restorative Service Providers"

## 2011-01-25 ENCOUNTER — Ambulatory Visit: Payer: Medicare Other | Admitting: Internal Medicine

## 2011-01-28 ENCOUNTER — Encounter: Payer: Medicare Other | Admitting: Rehabilitative and Restorative Service Providers"

## 2011-02-01 ENCOUNTER — Ambulatory Visit: Payer: Medicare Other | Attending: Internal Medicine | Admitting: Rehabilitative and Restorative Service Providers"

## 2011-02-01 DIAGNOSIS — R293 Abnormal posture: Secondary | ICD-10-CM | POA: Insufficient documentation

## 2011-02-01 DIAGNOSIS — M2560 Stiffness of unspecified joint, not elsewhere classified: Secondary | ICD-10-CM | POA: Insufficient documentation

## 2011-02-01 DIAGNOSIS — M6281 Muscle weakness (generalized): Secondary | ICD-10-CM | POA: Insufficient documentation

## 2011-02-01 DIAGNOSIS — IMO0001 Reserved for inherently not codable concepts without codable children: Secondary | ICD-10-CM | POA: Insufficient documentation

## 2011-02-01 DIAGNOSIS — M542 Cervicalgia: Secondary | ICD-10-CM | POA: Insufficient documentation

## 2011-02-03 ENCOUNTER — Encounter: Payer: Medicare Other | Admitting: Rehabilitative and Restorative Service Providers"

## 2011-02-04 ENCOUNTER — Encounter: Payer: Medicare Other | Admitting: Physical Therapy

## 2011-03-22 ENCOUNTER — Encounter: Payer: Self-pay | Admitting: Internal Medicine

## 2011-03-22 ENCOUNTER — Ambulatory Visit (INDEPENDENT_AMBULATORY_CARE_PROVIDER_SITE_OTHER): Payer: Medicare Other | Admitting: Family

## 2011-03-22 ENCOUNTER — Encounter: Payer: Self-pay | Admitting: Family

## 2011-03-22 ENCOUNTER — Ambulatory Visit (INDEPENDENT_AMBULATORY_CARE_PROVIDER_SITE_OTHER): Payer: Medicare Other | Admitting: Internal Medicine

## 2011-03-22 VITALS — BP 118/70 | HR 72 | Temp 98.7°F | Resp 16 | Ht 74.5 in | Wt 310.1 lb

## 2011-03-22 DIAGNOSIS — D500 Iron deficiency anemia secondary to blood loss (chronic): Secondary | ICD-10-CM

## 2011-03-22 DIAGNOSIS — G589 Mononeuropathy, unspecified: Secondary | ICD-10-CM

## 2011-03-22 DIAGNOSIS — I10 Essential (primary) hypertension: Secondary | ICD-10-CM

## 2011-03-22 DIAGNOSIS — R5383 Other fatigue: Secondary | ICD-10-CM

## 2011-03-22 DIAGNOSIS — R0989 Other specified symptoms and signs involving the circulatory and respiratory systems: Secondary | ICD-10-CM

## 2011-03-22 DIAGNOSIS — Z8546 Personal history of malignant neoplasm of prostate: Secondary | ICD-10-CM

## 2011-03-22 DIAGNOSIS — C61 Malignant neoplasm of prostate: Secondary | ICD-10-CM

## 2011-03-22 DIAGNOSIS — G629 Polyneuropathy, unspecified: Secondary | ICD-10-CM | POA: Insufficient documentation

## 2011-03-22 DIAGNOSIS — E119 Type 2 diabetes mellitus without complications: Secondary | ICD-10-CM

## 2011-03-22 DIAGNOSIS — R0609 Other forms of dyspnea: Secondary | ICD-10-CM

## 2011-03-22 DIAGNOSIS — R5381 Other malaise: Secondary | ICD-10-CM

## 2011-03-22 LAB — CBC WITH DIFFERENTIAL/PLATELET
Basophils Absolute: 0 10*3/uL (ref 0.0–0.1)
Basophils Relative: 0.5 % (ref 0.0–3.0)
Eosinophils Absolute: 0.2 10*3/uL (ref 0.0–0.7)
Eosinophils Relative: 2.8 % (ref 0.0–5.0)
HCT: 40.7 % (ref 39.0–52.0)
Hemoglobin: 13.3 g/dL (ref 13.0–17.0)
Lymphocytes Relative: 18.1 % (ref 12.0–46.0)
Lymphs Abs: 1.1 10*3/uL (ref 0.7–4.0)
MCHC: 32.7 g/dL (ref 30.0–36.0)
MCV: 83.3 fl (ref 78.0–100.0)
Monocytes Absolute: 0.5 10*3/uL (ref 0.1–1.0)
Monocytes Relative: 8.2 % (ref 3.0–12.0)
Neutro Abs: 4.4 10*3/uL (ref 1.4–7.7)
Neutrophils Relative %: 70.4 % (ref 43.0–77.0)
Platelets: 247 10*3/uL (ref 150.0–400.0)
RBC: 4.88 Mil/uL (ref 4.22–5.81)
RDW: 13.9 % (ref 11.5–14.6)
WBC: 6.2 10*3/uL (ref 4.5–10.5)

## 2011-03-22 LAB — BASIC METABOLIC PANEL
BUN: 17 mg/dL (ref 6–23)
CO2: 32 mEq/L (ref 19–32)
Calcium: 9 mg/dL (ref 8.4–10.5)
Chloride: 105 mEq/L (ref 96–112)
Creatinine, Ser: 0.8 mg/dL (ref 0.4–1.5)
GFR: 129.63 mL/min (ref >60.00)
Glucose, Bld: 78 mg/dL (ref 70–99)
Potassium: 4.2 mEq/L (ref 3.5–5.1)
Sodium: 143 mEq/L (ref 135–145)

## 2011-03-22 LAB — TSH: TSH: 2.17 u[IU]/mL (ref 0.35–5.50)

## 2011-03-22 LAB — HEMOGLOBIN A1C: Hgb A1c MFr Bld: 6.7 % — ABNORMAL HIGH (ref 4.6–6.5)

## 2011-03-22 LAB — PSA: PSA: 0.64 ng/mL (ref 0.10–4.00)

## 2011-03-22 LAB — VITAMIN B12: Vitamin B-12: 1081 pg/mL — ABNORMAL HIGH (ref 211–911)

## 2011-03-22 NOTE — Progress Notes (Signed)
Pt decided to reschedule and see Dr Artist Pais before he was seen in the office today.

## 2011-03-22 NOTE — Progress Notes (Signed)
Subjective:    Patient ID: Christopher Riley, male    DOB: 02/16/1951, 60 y.o.   MRN: [id number]  HPI  60 y/o AA male co generalized fatigue over last two months.  No other specific complains.  No nausea or vomiting.  Stools are loose but this is his baseline.   No shortness of breath or chest pain.   He has chronic right foot pain from neuropathy.  He has chronic hemorrhoids.  He has not followed up with GI physician. He has hx of blood loss anemia from hemorrhoids.  Lost 18 lbs - intentional  Review of Systems Negative for abnormal bleeding.  Pt seen by [facility] physician in [place].  He is considering left knee surgery which may eventually help is mobility  Past Medical History  Diagnosis Date  . Post traumatic stress disorder (PTSD)   . DM II (diabetes mellitus, type II), controlled   . Hypertension   . Prostate cancer     S/P radioactive seed tx  (Dr. Brunilda Payor)   . Colon polyps      adenomatous colon polyps 2008 and 2010 [facility]  . Erectile dysfunction   . Right retinal detachment   . Sleep apnea   . Hemorrhoids   . Glaucoma   . History of depression   . History of asthma   . Urinary incontinence   . BPH (benign prostatic hypertrophy)   . Iron deficiency anemia   . Anal fissure     History   Social History  . Marital Status: Married    Spouse Name: N/A    Number of Children: N/A  . Years of Education: N/A   Occupational History  . Retired     Former Systems developer History Main Topics  . Smoking status: Never Smoker   . Smokeless tobacco: Not on file  . Alcohol Use: No  . Drug Use: No  . Sexually Active: Not on file   Other Topics Concern  . Not on file   Social History Narrative   Retired Married Never SmokedAlcohol use-no  Drug use-no         Daily Caffeine Use-1 cup daily    Past Surgical History  Procedure Date  . Right knee replacement     1988  . Left knee surgery     [facility]W  . Right shoulder arthroscopic surgery 06/2009    (Dr.  Cleophas Dunker)  .  Transurethral resection of prostate 10/2005  . Cataract extraction     Family History  Problem Relation Age of Onset  . Diabetes Brother   . Other      no FH of colon cancer    No Known Allergies  Current Outpatient Prescriptions on File Prior to Visit  Medication Sig Dispense Refill  . albuterol (PROAIR HFA) 108 (90 BASE) MCG/ACT inhaler Inhale 2 puffs into the lungs 2 (two) times daily as needed.        . gabapentin (NEURONTIN) 400 MG tablet Take 400 mg by mouth 3 (three) times daily.        . lansoprazole (PREVACID) 30 MG capsule Take 30 mg by mouth daily before breakfast.        . lisinopril (PRINIVIL,ZESTRIL) 20 MG tablet Take 20 mg by mouth daily.        . metaxalone (SKELAXIN) 800 MG tablet Take 800 mg by mouth 3 (three) times daily.        . metFORMIN (GLUCOPHAGE-XR) 750 MG 24 hr tablet Take 750 mg by  mouth daily with breakfast.        . mometasone (ASMANEX 14 METERED DOSES) 220 MCG/INH inhaler Inhale 1 puff into the lungs daily as needed.       Marland Kitchen oxybutynin (DITROPAN-XL) 5 MG 24 hr tablet Take 5 mg by mouth 2 (two) times daily.        Marland Kitchen oxyCODONE-acetaminophen (PERCOCET) 10-325 MG per tablet Take 1 tablet by mouth every 4 (four) hours as needed.        Marland Kitchen QUEtiapine (SEROQUEL) 300 MG tablet Take 300 mg by mouth at bedtime as needed.       . simvastatin (ZOCOR) 40 MG tablet Take 40 mg by mouth at bedtime. Pt takes as needed.      . traZODone (DESYREL) 100 MG tablet Take 100 mg by mouth at bedtime as needed.         BP 136/74  Temp(Src) 98.6 F (37 C) (Oral)  Wt 314 lb (142.429 kg)       Objective:   Physical Exam  Constitutional: He appears well-developed and well-nourished. No distress.  HENT:  Head: Normocephalic and atraumatic.  Mouth/Throat: Oropharynx is clear and moist.  Neck: Normal range of motion. Neck supple.  Cardiovascular: Normal rate, regular rhythm and normal heart sounds.        Occasional ectopy  Pulmonary/Chest: Effort normal and breath sounds  normal. No respiratory distress. He has no wheezes. He has no rales.  Abdominal: Soft. Bowel sounds are normal.  Musculoskeletal: He exhibits edema.  Lymphadenopathy:    He has no cervical adenopathy.  Skin: Skin is warm and dry. No rash noted.  Psychiatric: He has a normal mood and affect.  Msk:  Bilateral knee braces       Assessment & Plan:

## 2011-03-22 NOTE — Assessment & Plan Note (Signed)
Pt has hx of OSA but can not tolerate CPAP mask.

## 2011-03-22 NOTE — Assessment & Plan Note (Signed)
Pt complains of fatigue over last 2 months.  Physical exam normal / baseline.  Rule out anemia.

## 2011-03-22 NOTE — Assessment & Plan Note (Signed)
Stable.  Continue meds  BP: 136/74 mmHg

## 2011-03-22 NOTE — Patient Instructions (Signed)
Our office will contact you re:  Blood test results Please call our office if your symptoms do not improve or gets worse.

## 2011-03-22 NOTE — Assessment & Plan Note (Signed)
Pt has lost approx 10 lbs.  Continue dietary measures. Obtain A1c.  Continue metformin. Lab Results  Component Value Date   CREATININE 0.8 03/22/2011

## 2011-03-22 NOTE — Assessment & Plan Note (Signed)
Pt has chronic neuropathic pain - right foot greater than left.  Continue gabapentin.  He takes as needed.

## 2011-03-23 NOTE — Progress Notes (Signed)
  Subjective:    Patient ID: Christopher Riley, male    DOB: 06/26/1951, 59 y.o.   MRN: [id number]  HPI    Review of Systems     Objective:   Physical Exam        Assessment & Plan:  Pt was not seen.  He wished to reschedule with Dr. Artist Pais.

## 2011-04-29 ENCOUNTER — Telehealth: Payer: Self-pay | Admitting: *Deleted

## 2011-04-29 NOTE — Telephone Encounter (Signed)
Lab is requesting additional dx code for [facility] 10/26/10 for BNP. 782.3 will not cover test. Are there any other supporting dx codes that we could use?

## 2011-04-30 NOTE — Telephone Encounter (Signed)
Spoke to [employer] and provided code 786.05. Code will cover test.

## 2011-04-30 NOTE — Telephone Encounter (Signed)
Use shortness of breath code 

## 2012-08-15 ENCOUNTER — Ambulatory Visit (INDEPENDENT_AMBULATORY_CARE_PROVIDER_SITE_OTHER): Payer: Medicare Other | Admitting: Internal Medicine

## 2012-08-15 ENCOUNTER — Encounter: Payer: Self-pay | Admitting: Internal Medicine

## 2012-08-15 VITALS — BP 146/72 | Temp 97.7°F | Wt 314.0 lb

## 2012-08-15 DIAGNOSIS — C61 Malignant neoplasm of prostate: Secondary | ICD-10-CM

## 2012-08-15 DIAGNOSIS — R5383 Other fatigue: Secondary | ICD-10-CM | POA: Insufficient documentation

## 2012-08-15 DIAGNOSIS — R5381 Other malaise: Secondary | ICD-10-CM

## 2012-08-15 DIAGNOSIS — D649 Anemia, unspecified: Secondary | ICD-10-CM

## 2012-08-15 DIAGNOSIS — I10 Essential (primary) hypertension: Secondary | ICD-10-CM

## 2012-08-15 DIAGNOSIS — E119 Type 2 diabetes mellitus without complications: Secondary | ICD-10-CM

## 2012-08-15 DIAGNOSIS — Z0000 Encounter for general adult medical examination without abnormal findings: Secondary | ICD-10-CM

## 2012-08-15 DIAGNOSIS — G4733 Obstructive sleep apnea (adult) (pediatric): Secondary | ICD-10-CM

## 2012-08-15 LAB — HEPATIC FUNCTION PANEL
ALT: 31 U/L (ref 0–53)
AST: 29 U/L (ref 0–37)
Albumin: 3.9 g/dL (ref 3.5–5.2)
Alkaline Phosphatase: 70 U/L (ref 39–117)
Bilirubin, Direct: 0 mg/dL (ref 0.0–0.3)
Total Bilirubin: 0.5 mg/dL (ref 0.3–1.2)
Total Protein: 6.9 g/dL (ref 6.0–8.3)

## 2012-08-15 LAB — CBC WITH DIFFERENTIAL/PLATELET
Basophils Absolute: 0 10*3/uL (ref 0.0–0.1)
Basophils Relative: 0.2 % (ref 0.0–3.0)
Eosinophils Absolute: 0.2 10*3/uL (ref 0.0–0.7)
Eosinophils Relative: 1.8 % (ref 0.0–5.0)
HCT: 43.1 % (ref 39.0–52.0)
Hemoglobin: 14 g/dL (ref 13.0–17.0)
Lymphocytes Relative: 12.5 % (ref 12.0–46.0)
Lymphs Abs: 1.1 10*3/uL (ref 0.7–4.0)
MCHC: 32.5 g/dL (ref 30.0–36.0)
MCV: 82.5 fl (ref 78.0–100.0)
Monocytes Absolute: 0.7 10*3/uL (ref 0.1–1.0)
Monocytes Relative: 8.2 % (ref 3.0–12.0)
Neutro Abs: 7 10*3/uL (ref 1.4–7.7)
Neutrophils Relative %: 77.3 % — ABNORMAL HIGH (ref 43.0–77.0)
Platelets: 249 10*3/uL (ref 150.0–400.0)
RBC: 5.23 Mil/uL (ref 4.22–5.81)
RDW: 14.3 % (ref 11.5–14.6)
WBC: 9 10*3/uL (ref 4.5–10.5)

## 2012-08-15 LAB — BASIC METABOLIC PANEL
BUN: 13 mg/dL (ref 6–23)
CO2: 30 mEq/L (ref 19–32)
Calcium: 8.9 mg/dL (ref 8.4–10.5)
Chloride: 103 mEq/L (ref 96–112)
Creatinine, Ser: 0.9 mg/dL (ref 0.4–1.5)
GFR: 115.29 mL/min (ref >60.00)
Glucose, Bld: 70 mg/dL (ref 70–99)
Potassium: 3.9 mEq/L (ref 3.5–5.1)
Sodium: 137 mEq/L (ref 135–145)

## 2012-08-15 LAB — HEMOGLOBIN A1C: Hgb A1c MFr Bld: 6.4 % (ref 4.6–6.5)

## 2012-08-15 LAB — TSH: TSH: 2.38 u[IU]/mL (ref 0.35–5.50)

## 2012-08-15 LAB — PSA: PSA: 0.5 ng/mL (ref 0.10–4.00)

## 2012-08-15 NOTE — Assessment & Plan Note (Signed)
If workup for fatigue negative consider referral to sleep specialist for another trial of CPAP therapy.

## 2012-08-15 NOTE — Patient Instructions (Signed)
[facility] follow [facility] with [facility] I suggest you taper off Atenonol and restart ACE inhibitor. Our office will contact you re: blood test resuls

## 2012-08-15 NOTE — Assessment & Plan Note (Signed)
Monitor PSA.  Patient inquired about testosterone replacement. Patient informed he is not a candidate for testosterone replacement with history of prostate cancer.
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
## 2012-08-15 NOTE — Assessment & Plan Note (Signed)
His weight is stable. Monitor A1c

## 2012-08-15 NOTE — Assessment & Plan Note (Signed)
62 year old African American male complains of severe progressive fatigue over last several months. He has had a bout of significant anemia in the past. I doubt his symptoms secondary to anemia.  His conjunctiva is pink and capillary refill is normal. Check CBC differential, A1c, thyroid function studies and LFTs.  On exam patient has abdominal distention and firm area mid upper abdomen. If initial testing negative, consider CT of abdomen and pelvis to rule out malignancy.

## 2012-08-15 NOTE — Assessment & Plan Note (Addendum)
Unclear why his antihypertensives were adjusted. His fatigue may be related to start of beta blocker. Patient advised to follow up with the physician to taper off beta blocker and restart ACE inhibitor considering type II diabetes. BP: 146/72 mmHg

## 2012-08-22 ENCOUNTER — Other Ambulatory Visit: Payer: Self-pay | Admitting: Internal Medicine

## 2012-08-22 DIAGNOSIS — R1900 Intra-abdominal and pelvic swelling, mass and lump, unspecified site: Secondary | ICD-10-CM

## 2012-08-28 ENCOUNTER — Other Ambulatory Visit: Payer: Medicare Other

## 2012-08-31 ENCOUNTER — Inpatient Hospital Stay: Admission: RE | Admit: 2012-08-31 | Payer: Medicare Other | Source: Ambulatory Visit

## 2012-09-01 ENCOUNTER — Ambulatory Visit (INDEPENDENT_AMBULATORY_CARE_PROVIDER_SITE_OTHER)
Admission: RE | Admit: 2012-09-01 | Discharge: 2012-09-01 | Disposition: A | Discharge status: Home / Self Care | Payer: Medicare Other | Source: Ambulatory Visit | Attending: Internal Medicine | Admitting: Internal Medicine

## 2012-09-01 ENCOUNTER — Inpatient Hospital Stay: Admission: RE | Admit: 2012-09-01 | Payer: Medicare Other | Source: Ambulatory Visit

## 2012-09-01 DIAGNOSIS — R1900 Intra-abdominal and pelvic swelling, mass and lump, unspecified site: Secondary | ICD-10-CM

## 2012-09-01 MED ORDER — IOHEXOL 300 MG/ML  SOLN
100.0000 mL | Freq: Once | INTRAMUSCULAR | Status: AC | PRN
  Administered 2012-09-01: 100 mL via INTRAVENOUS

## 2012-09-05 ENCOUNTER — Encounter: Payer: Self-pay | Admitting: *Deleted

## 2012-09-26 ENCOUNTER — Ambulatory Visit: Payer: Medicare Other | Admitting: Family Medicine

## 2012-09-27 ENCOUNTER — Ambulatory Visit (INDEPENDENT_AMBULATORY_CARE_PROVIDER_SITE_OTHER): Payer: Medicare Other | Admitting: Internal Medicine

## 2012-09-27 VITALS — BP 124/62 | HR 95 | Temp 97.9°F | Wt 321.0 lb

## 2012-09-27 DIAGNOSIS — R079 Chest pain, unspecified: Secondary | ICD-10-CM

## 2012-09-27 NOTE — Assessment & Plan Note (Signed)
62 year old [place] American male with multiple risk factors experiencing intermittent chest pains. Refer to cardiology for further cardiac testing. Start aspirin therapy.  Continue statin therapy.  Check CXR.
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
## 2012-09-28 ENCOUNTER — Ambulatory Visit (INDEPENDENT_AMBULATORY_CARE_PROVIDER_SITE_OTHER)
Admission: RE | Admit: 2012-09-28 | Discharge: 2012-09-28 | Disposition: A | Discharge status: Home / Self Care | Payer: Medicare Other | Source: Ambulatory Visit | Attending: Internal Medicine | Admitting: Internal Medicine

## 2012-09-28 DIAGNOSIS — R079 Chest pain, unspecified: Secondary | ICD-10-CM

## 2012-10-02 ENCOUNTER — Ambulatory Visit (INDEPENDENT_AMBULATORY_CARE_PROVIDER_SITE_OTHER): Payer: Medicare Other | Admitting: Cardiology

## 2012-10-02 ENCOUNTER — Encounter: Payer: Self-pay | Admitting: Cardiology

## 2012-10-02 VITALS — BP 158/80 | HR 78 | Ht 75.0 in | Wt 316.0 lb

## 2012-10-02 DIAGNOSIS — E785 Hyperlipidemia, unspecified: Secondary | ICD-10-CM

## 2012-10-02 DIAGNOSIS — I10 Essential (primary) hypertension: Secondary | ICD-10-CM
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
## 2012-10-02 NOTE — Patient Instructions (Addendum)
The current medical regimen is effective;  continue present plan and medications.  Follow up as needed 

## 2012-12-15 ENCOUNTER — Other Ambulatory Visit: Payer: Self-pay | Admitting: Urology

## 2012-12-20 ENCOUNTER — Other Ambulatory Visit: Payer: Self-pay | Admitting: Urology

## 2012-12-26 ENCOUNTER — Other Ambulatory Visit: Payer: Self-pay | Admitting: Urology

## 2012-12-29 ENCOUNTER — Encounter (HOSPITAL_COMMUNITY): Payer: Self-pay | Admitting: Pharmacy Technician

## 2013-01-01 NOTE — Patient Instructions (Addendum)
20 PROMISE BUSHONG  01/01/2013   Your procedure is scheduled on: 01/09/13  Report to [facility] at 6:30 AM.  Call this number if you have problems the morning of surgery [phone]   Remember: please bring inhaler on day of surgery    Do not eat food or drink liquids After Midnight.     Take these medicines the morning of surgery with A SIP OF WATER: atenolol, bupropion, omeprazole   Do not wear jewelry, make-up or nail polish.  Do not wear lotions, powders, or perfumes. You may wear deodorant.  Do not shave 48 hours prior to surgery. Men may shave face and neck.  Do not bring valuables to the hospital.  Contacts, dentures or bridgework may not be worn into surgery.  Leave suitcase in the car. After surgery it may be brought to your room.  For patients admitted to the hospital, checkout time is 11:00 AM the day of discharge.    Please read over the following fact sheets that you were given: MRSA Information.  Birdie Sons, RN  pre op nurse call if needed [phone]    FAILURE TO FOLLOW THESE INSTRUCTIONS MAY RESULT IN CANCELLATION OF YOUR SURGERY   Patient Signature: ___________________________________________

## 2013-01-01 NOTE — Progress Notes (Signed)
Chest x-ray 10/03/12 on [facility], EKG 09/27/12 on [facility]

## 2013-01-02 ENCOUNTER — Encounter (HOSPITAL_COMMUNITY): Payer: Self-pay

## 2013-01-02 ENCOUNTER — Encounter (HOSPITAL_COMMUNITY)
Admission: RE | Admit: 2013-01-02 | Discharge: 2013-01-02 | Disposition: A | Discharge status: Home / Self Care | Payer: Medicare Other | Source: Ambulatory Visit | Attending: Urology | Admitting: Urology

## 2013-01-02 DIAGNOSIS — E119 Type 2 diabetes mellitus without complications: Secondary | ICD-10-CM | POA: Insufficient documentation

## 2013-01-02 DIAGNOSIS — Z01812 Encounter for preprocedural laboratory examination: Secondary | ICD-10-CM | POA: Insufficient documentation

## 2013-01-02 DIAGNOSIS — K219 Gastro-esophageal reflux disease without esophagitis: Secondary | ICD-10-CM | POA: Insufficient documentation

## 2013-01-02 DIAGNOSIS — N529 Male erectile dysfunction, unspecified: Secondary | ICD-10-CM | POA: Insufficient documentation

## 2013-01-02 DIAGNOSIS — I10 Essential (primary) hypertension: Secondary | ICD-10-CM | POA: Insufficient documentation

## 2013-01-02 HISTORY — DX: Other chronic pain: G89.29

## 2013-01-02 HISTORY — DX: Depression, unspecified: F32.A

## 2013-01-02 HISTORY — DX: Major depressive disorder, single episode, unspecified: F32.9

## 2013-01-02 HISTORY — DX: Unspecified osteoarthritis, unspecified site: M19.90

## 2013-01-02 HISTORY — DX: Gastro-esophageal reflux disease without esophagitis: K21.9

## 2013-01-02 LAB — CBC
HCT: 42.3 % (ref 39.0–52.0)
Hemoglobin: 13.5 g/dL (ref 13.0–17.0)
MCH: 26.4 pg (ref 26.0–34.0)
MCHC: 31.9 g/dL (ref 30.0–36.0)
MCV: 82.8 fL (ref 78.0–100.0)
Platelets: 252 10*3/uL (ref 150–400)
RBC: 5.11 MIL/uL (ref 4.22–5.81)
RDW: 14.1 % (ref 11.5–15.5)
WBC: 7.1 10*3/uL (ref 4.0–10.5)

## 2013-01-02 LAB — BASIC METABOLIC PANEL
BUN: 14 mg/dL (ref 6–23)
CO2: 31 mEq/L (ref 19–32)
Calcium: 9.1 mg/dL (ref 8.4–10.5)
Chloride: 103 mEq/L (ref 96–112)
Creatinine, Ser: 0.82 mg/dL (ref 0.50–1.35)
GFR calc Af Amer: >90 mL/min (ref >90)
GFR calc non Af Amer: >90 mL/min (ref >90)
Glucose, Bld: 117 mg/dL — ABNORMAL HIGH (ref 70–99)
Potassium: 4.1 mEq/L (ref 3.5–5.1)
Sodium: 140 mEq/L (ref 135–145)

## 2013-01-02 LAB — SURGICAL PCR SCREEN
MRSA, PCR: NEGATIVE
Staphylococcus aureus: NEGATIVE

## 2013-01-02 NOTE — Progress Notes (Signed)
According to [facility], gentamicin needs to be given along with another antibiotic for insertion of penile prosthesis. Please address

## 2013-01-08 MED ORDER — GENTAMICIN SULFATE 40 MG/ML IJ SOLN
540.0000 mg | Freq: Once | INTRAVENOUS | Status: AC
  Administered 2013-01-09: 540 mg via INTRAVENOUS
  Filled 2013-01-08: qty 13.5

## 2013-01-09 ENCOUNTER — Encounter (HOSPITAL_COMMUNITY): Payer: Self-pay | Admitting: *Deleted

## 2013-01-09 ENCOUNTER — Observation Stay (HOSPITAL_COMMUNITY)
Admission: RE | Admit: 2013-01-09 | Discharge: 2013-01-10 | Disposition: A | Discharge status: Home / Self Care | Payer: Medicare Other | Source: Ambulatory Visit | Attending: Urology | Admitting: Urology

## 2013-01-09 ENCOUNTER — Encounter (HOSPITAL_COMMUNITY): Payer: Self-pay | Admitting: Anesthesiology

## 2013-01-09 ENCOUNTER — Encounter (HOSPITAL_COMMUNITY)
Admission: RE | Disposition: A | Discharge status: Home / Self Care | Payer: Self-pay | Source: Ambulatory Visit | Attending: Urology

## 2013-01-09 ENCOUNTER — Ambulatory Visit (HOSPITAL_COMMUNITY): Payer: Medicare Other | Admitting: Anesthesiology

## 2013-01-09 DIAGNOSIS — K219 Gastro-esophageal reflux disease without esophagitis: Secondary | ICD-10-CM | POA: Insufficient documentation

## 2013-01-09 DIAGNOSIS — E119 Type 2 diabetes mellitus without complications: Secondary | ICD-10-CM | POA: Insufficient documentation

## 2013-01-09 DIAGNOSIS — N529 Male erectile dysfunction, unspecified: Principal | ICD-10-CM | POA: Insufficient documentation

## 2013-01-09 DIAGNOSIS — G4730 Sleep apnea, unspecified: Secondary | ICD-10-CM | POA: Insufficient documentation

## 2013-01-09 DIAGNOSIS — J45909 Unspecified asthma, uncomplicated: Secondary | ICD-10-CM | POA: Insufficient documentation

## 2013-01-09 DIAGNOSIS — Z79899 Other long term (current) drug therapy: Secondary | ICD-10-CM | POA: Insufficient documentation

## 2013-01-09 DIAGNOSIS — C61 Malignant neoplasm of prostate: Secondary | ICD-10-CM | POA: Insufficient documentation

## 2013-01-09 DIAGNOSIS — I10 Essential (primary) hypertension: Secondary | ICD-10-CM | POA: Insufficient documentation

## 2013-01-09 HISTORY — PX: PENILE PROSTHESIS IMPLANT: SHX240

## 2013-01-09 LAB — GLUCOSE, CAPILLARY: Glucose-Capillary: 99 mg/dL (ref 70–99)

## 2013-01-09 SURGERY — INSERTION, PENILE PROSTHESIS
Anesthesia: General | Wound class: Clean Contaminated

## 2013-01-09 MED ORDER — METFORMIN HCL 500 MG PO TABS
1000.0000 mg | ORAL_TABLET | Freq: Three times a day (TID) | ORAL | Status: DC
  Filled 2013-01-09 (×5): qty 2

## 2013-01-09 MED ORDER — SODIUM CHLORIDE 0.9 % IR SOLN
Status: DC | PRN
  Administered 2013-01-09: 09:00:00

## 2013-01-09 MED ORDER — MEPERIDINE HCL 50 MG/ML IJ SOLN: 6.2500 mg | INTRAMUSCULAR | Status: DC | PRN

## 2013-01-09 MED ORDER — ATORVASTATIN CALCIUM 80 MG PO TABS
80.0000 mg | ORAL_TABLET | Freq: Every evening | ORAL | Status: DC
  Filled 2013-01-09: qty 1

## 2013-01-09 MED ORDER — SODIUM CHLORIDE 0.45 % IV SOLN
INTRAVENOUS | Status: DC
  Administered 2013-01-09 – 2013-01-10 (×2): via INTRAVENOUS

## 2013-01-09 MED ORDER — LACTATED RINGERS IV SOLN
INTRAVENOUS | Status: DC | PRN
  Administered 2013-01-09 (×3): via INTRAVENOUS

## 2013-01-09 MED ORDER — TRAZODONE HCL 150 MG PO TABS
400.0000 mg | ORAL_TABLET | Freq: Every evening | ORAL | Status: DC | PRN
  Filled 2013-01-09: qty 1

## 2013-01-09 MED ORDER — PANTOPRAZOLE SODIUM 40 MG PO TBEC
40.0000 mg | DELAYED_RELEASE_TABLET | Freq: Every day | ORAL | Status: DC
  Filled 2013-01-09 (×2): qty 1

## 2013-01-09 MED ORDER — OXYCODONE HCL 5 MG PO TABS: 5.0000 mg | ORAL_TABLET | Freq: Once | ORAL | Status: DC | PRN

## 2013-01-09 MED ORDER — ACETAMINOPHEN 10 MG/ML IV SOLN: 1000.0000 mg | Freq: Once | INTRAVENOUS | Status: DC | PRN

## 2013-01-09 MED ORDER — PROMETHAZINE HCL 25 MG/ML IJ SOLN: 6.2500 mg | INTRAMUSCULAR | Status: DC | PRN

## 2013-01-09 MED ORDER — HYDROCHLOROTHIAZIDE 25 MG PO TABS
25.0000 mg | ORAL_TABLET | Freq: Every evening | ORAL | Status: DC
  Filled 2013-01-09 (×2): qty 1

## 2013-01-09 MED ORDER — OXYCODONE HCL 5 MG/5ML PO SOLN
5.0000 mg | Freq: Once | ORAL | Status: DC | PRN
  Filled 2013-01-09: qty 5

## 2013-01-09 MED ORDER — OXYCODONE-ACETAMINOPHEN 5-325 MG PO TABS
1.0000 | ORAL_TABLET | ORAL | Status: DC | PRN
  Administered 2013-01-09 – 2013-01-10 (×3): 2 via ORAL
  Filled 2013-01-09 (×3): qty 2

## 2013-01-09 MED ORDER — DEXTROSE 5 % IV SOLN
3.0000 g | Freq: Once | INTRAVENOUS | Status: AC
  Administered 2013-01-09: 3 g via INTRAVENOUS
  Filled 2013-01-09: qty 3000

## 2013-01-09 MED ORDER — FENTANYL CITRATE 0.05 MG/ML IJ SOLN
INTRAMUSCULAR | Status: DC | PRN
  Administered 2013-01-09: 100 ug via INTRAVENOUS
  Administered 2013-01-09 (×5): 50 ug via INTRAVENOUS

## 2013-01-09 MED ORDER — ZOLPIDEM TARTRATE 5 MG PO TABS: 5.0000 mg | ORAL_TABLET | Freq: Every evening | ORAL | Status: DC | PRN

## 2013-01-09 MED ORDER — PROPOFOL 10 MG/ML IV BOLUS
INTRAVENOUS | Status: DC | PRN
  Administered 2013-01-09: 200 mg via INTRAVENOUS

## 2013-01-09 MED ORDER — HYDROMORPHONE HCL PF 1 MG/ML IJ SOLN
INTRAMUSCULAR | Status: DC | PRN
  Administered 2013-01-09 (×2): 1 mg via INTRAVENOUS

## 2013-01-09 MED ORDER — LIDOCAINE HCL (CARDIAC) 20 MG/ML IV SOLN
INTRAVENOUS | Status: DC | PRN
  Administered 2013-01-09: 60 mg via INTRAVENOUS

## 2013-01-09 MED ORDER — METHADONE HCL 5 MG PO TABS
5.0000 mg | ORAL_TABLET | Freq: Two times a day (BID) | ORAL | Status: DC | PRN
  Administered 2013-01-10: 5 mg via ORAL
  Filled 2013-01-09: qty 1

## 2013-01-09 MED ORDER — CEFAZOLIN SODIUM 1-5 GM-% IV SOLN
1.0000 g | Freq: Three times a day (TID) | INTRAVENOUS | Status: DC
  Administered 2013-01-09 – 2013-01-10 (×3): 1 g via INTRAVENOUS
  Filled 2013-01-09 (×5): qty 50

## 2013-01-09 MED ORDER — HYDRALAZINE HCL 20 MG/ML IJ SOLN: 5.0000 mg | INTRAMUSCULAR | Status: DC | PRN

## 2013-01-09 MED ORDER — ATENOLOL 25 MG PO TABS
25.0000 mg | ORAL_TABLET | Freq: Two times a day (BID) | ORAL | Status: DC
  Administered 2013-01-09: 25 mg via ORAL
  Filled 2013-01-09 (×3): qty 1

## 2013-01-09 MED ORDER — GLYCOPYRROLATE 0.2 MG/ML IJ SOLN
INTRAMUSCULAR | Status: DC | PRN
  Administered 2013-01-09: 0.2 mg via INTRAVENOUS

## 2013-01-09 MED ORDER — FLUTICASONE PROPIONATE HFA 44 MCG/ACT IN AERO
2.0000 | INHALATION_SPRAY | Freq: Two times a day (BID) | RESPIRATORY_TRACT | Status: DC
  Administered 2013-01-09 – 2013-01-10 (×2): 2 via RESPIRATORY_TRACT
  Filled 2013-01-09: qty 10.6

## 2013-01-09 MED ORDER — BUPROPION HCL 100 MG PO TABS
100.0000 mg | ORAL_TABLET | Freq: Two times a day (BID) | ORAL | Status: DC
  Filled 2013-01-09 (×3): qty 1

## 2013-01-09 MED ORDER — HYDROMORPHONE HCL PF 1 MG/ML IJ SOLN: 0.2500 mg | INTRAMUSCULAR | Status: DC | PRN

## 2013-01-09 MED ORDER — CEFAZOLIN SODIUM 1-5 GM-% IV SOLN
INTRAVENOUS | Status: AC
Start: 2013-01-09 — End: 2013-01-09
  Filled 2013-01-09: qty 50

## 2013-01-09 MED ORDER — HYDROMORPHONE HCL PF 1 MG/ML IJ SOLN
0.5000 mg | INTRAMUSCULAR | Status: DC | PRN
  Administered 2013-01-09 (×2): 1 mg via INTRAVENOUS
  Filled 2013-01-09 (×2): qty 1

## 2013-01-09 MED ORDER — ONDANSETRON HCL 4 MG/2ML IJ SOLN
INTRAMUSCULAR | Status: DC | PRN
  Administered 2013-01-09: 4 mg via INTRAVENOUS

## 2013-01-09 MED ORDER — CEFAZOLIN SODIUM-DEXTROSE 2-3 GM-% IV SOLR
INTRAVENOUS | Status: AC
  Filled 2013-01-09: qty 50

## 2013-01-09 SURGICAL SUPPLY — 49 items
APL SKNCLS STERI-STRIP NONHPOA (GAUZE/BANDAGES/DRESSINGS) ×1
ASSEMBLY KIT ×1 IMPLANT
BAG URINE DRAINAGE (UROLOGICAL SUPPLIES) ×2 IMPLANT
BANDAGE COBAN STERILE 2 (GAUZE/BANDAGES/DRESSINGS) IMPLANT
BANDAGE CONFORM 2  STR LF (GAUZE/BANDAGES/DRESSINGS) ×1 IMPLANT
BANDAGE GAUZE ELAST BULKY 4 IN (GAUZE/BANDAGES/DRESSINGS) ×2 IMPLANT
BENZOIN TINCTURE PRP APPL 2/3 (GAUZE/BANDAGES/DRESSINGS) ×2 IMPLANT
BLADE HEX COATED 2.75 (ELECTRODE) ×2 IMPLANT
BLADE SURG 15 STRL LF DISP TIS (BLADE) ×2 IMPLANT
BLADE SURG 15 STRL SS (BLADE) ×4
CANISTER SUCTION 2500CC (MISCELLANEOUS) IMPLANT
CATH FOLEY 2WAY SLVR  5CC 14FR (CATHETERS) ×1
CATH FOLEY 2WAY SLVR 5CC 14FR (CATHETERS) ×1 IMPLANT
CLOSURE STERI-STRIP 1/4X4 (GAUZE/BANDAGES/DRESSINGS) ×1 IMPLANT
CLOTH BEACON ORANGE TIMEOUT ST (SAFETY) ×2 IMPLANT
COVER MAYO STAND STRL (DRAPES) ×2 IMPLANT
COVER SURGICAL LIGHT HANDLE (MISCELLANEOUS) ×2 IMPLANT
CYL ST 20CM INFRAPUB OTR 0 (KITS) ×2 IMPLANT
DISSECTOR ROUND CHERRY 3/8 STR (MISCELLANEOUS) IMPLANT
DRAPE LAPAROTOMY T 102X78X121 (DRAPES) ×2 IMPLANT
DRSG TEGADERM 4X4.75 (GAUZE/BANDAGES/DRESSINGS) ×4 IMPLANT
ELECT REM PT RETURN 9FT ADLT (ELECTROSURGICAL) ×2
ELECTRODE REM PT RTRN 9FT ADLT (ELECTROSURGICAL) ×1 IMPLANT
GLOVE BIOGEL M 7.0 STRL (GLOVE) ×2 IMPLANT
GOWN STRL NON-REIN LRG LVL3 (GOWN DISPOSABLE) ×2 IMPLANT
HOVERMATT SINGLE USE (MISCELLANEOUS) ×1 IMPLANT
KIT BASIN OR (CUSTOM PROCEDURE TRAY) ×2 IMPLANT
LUBRICANT JELLY ST 5GR 8946 (MISCELLANEOUS) ×10 IMPLANT
NS IRRIG 1000ML POUR BTL (IV SOLUTION) ×4 IMPLANT
PACK GENERAL/GYN (CUSTOM PROCEDURE TRAY) ×2 IMPLANT
PLUG CATH AND CAP STER (CATHETERS) ×2 IMPLANT
PROSTHESIS INFRAPUBIC TITAN 20 - LOG100739 ×1 IMPLANT
RESERVOIR 75CC LOCKOUT BIOFLEX (Erectile Restoration) ×1 IMPLANT
RETRACTOR WILSON SYSTEM (INSTRUMENTS) IMPLANT
SOL PREP POV-IOD 16OZ 10% (MISCELLANEOUS) IMPLANT
SOL PREP PROV IODINE SCRUB 4OZ (MISCELLANEOUS) ×2 IMPLANT
SPONGE GAUZE 4X4 12PLY (GAUZE/BANDAGES/DRESSINGS) ×1 IMPLANT
SPONGE LAP 4X18 X RAY DECT (DISPOSABLE) IMPLANT
STRIP CLOSURE SKIN 1/2X4 (GAUZE/BANDAGES/DRESSINGS) ×2 IMPLANT
SUPPORT SCROTAL LG STRP (MISCELLANEOUS) ×2 IMPLANT
SUT MNCRL AB 4-0 PS2 18 (SUTURE) ×2 IMPLANT
SUT VIC AB 0 UR5 27 (SUTURE) ×18 IMPLANT
SUT VIC AB 2-0 UR6 27 (SUTURE) IMPLANT
SYR 20CC LL (SYRINGE) ×2 IMPLANT
SYR 50ML LL SCALE MARK (SYRINGE) ×4 IMPLANT
SYRINGE 10CC LL (SYRINGE) ×2 IMPLANT
TOWEL OR 17X26 10 PK STRL BLUE (TOWEL DISPOSABLE) ×3 IMPLANT
TOWEL OR NON WOVEN STRL DISP B (DISPOSABLE) ×1 IMPLANT
WATER STERILE IRR 1500ML POUR (IV SOLUTION) ×2 IMPLANT

## 2013-01-09 NOTE — H&P (Signed)
History of Present Illness  [name] returns for follow-up management of erectile dysfunction and prostate cancer.  His PSA fluctuates between 0.4 and 0.6.  He has been complaining of difficulty with erections.  He has tried PDE5 without any satisfactory results.  He had used PGE1 in the past and he is not interested in using them anymore.  He has a vacuum but he is not happy with it.  He would like to have a penile prosthesis.   Past Medical History Problems  1. History of  Adult Sleep Apnea 780.57 2. History of  Anxiety (Symptom) 300.00 3. History of  Arthritis V13.4 4. History of  Asthma 493.90 5. History of  Coagulation Defects 286.9 6. History of  Depression 311 7. History of  Esophageal Reflux 530.81 8. History of  Glaucoma 365.9 9. History of  Peptic Ulcer V12.71 10. History of  Venous Thrombosis V12.51  Surgical History Problems  1. History of  Knee Replacement 2. History of  Knee Surgery  Current Meds 1. Accu-Chek Aviva Plus In Vitro Strip; Therapy: 15Feb2011 to 2. Atenolol TABS; Therapy: (Recorded:13May2014) to 3. Atorvastatin Calcium 80 MG Oral Tablet; Therapy: (Recorded:13May2014) to 4. BuPROPion HCl ER (SR) 200 MG Oral Tablet Extended Release 12 Hour; Therapy: 12Dec2011  to 5. Hydrochlorothiazide 25 MG Oral Tablet; Therapy: (Recorded:13May2014) to 6. MetFORMIN HCl TABS; Therapy: (Recorded:13Dec2011) to 7. Methadone HCl 5 MG Oral Tablet; Therapy: (Recorded:04Mar2008) to 8. Omeprazole 20 MG Oral Capsule Delayed Release; Therapy: (Recorded:13May2014) to 9. QUEtiapine Fumarate 300 MG Oral Tablet; Therapy: 11Oct2011 to 10. TraZODone HCl 50 MG Oral Tablet; Therapy: (Recorded:13May2014) to  Allergies Medication  1. No Known Drug Allergies  Family History Problems  1. Fraternal history of  Diabetes Mellitus V18.0 2. Family history of  Family Health Status Number Of Children 2 sons 2 daughters 3. Fraternal history of  Hypertension V17.49  Social History Problems   1. Marital History - Currently Married 2. Never A Smoker Denied  3. Alcohol Use 4. Tobacco Use  Review of Systems Genitourinary, constitutional, skin, eye, otolaryngeal, hematologic/lymphatic, cardiovascular, pulmonary, endocrine, musculoskeletal, gastrointestinal, neurological and psychiatric system(s) were reviewed and pertinent findings if present are noted.    Physical Exam: BP: 157/73     P: 78    R: !8     T:  97.7 Constitutional: Well nourished and well developed . No acute distress.  ENT:. The ears and nose are normal in appearance.  Neck: The appearance of the neck is normal and no neck mass is present.  Pulmonary: No respiratory distress and normal respiratory rhythm and effort.  Cardiovascular: Heart rate and rhythm are normal . No peripheral edema.  Abdomen: The abdomen is soft and nontender. No masses are palpated. No CVA tenderness. No hernias are palpable. No hepatosplenomegaly noted.  Rectal: Rectal exam demonstrates normal sphincter tone, no tenderness and no masses. Estimated prostate size is 1+. The prostate has no nodularity and is not tender. The left seminal vesicle is nonpalpable. The right seminal vesicle is nonpalpable. The perineum is normal on inspection.  Genitourinary: Examination of the penis demonstrates no discharge, no masses, no lesions and a normal meatus. The scrotum is without lesions. The right epididymis is palpably normal and non-tender. The left epididymis is palpably normal and non-tender. The right testis is non-tender and without masses. The left testis is non-tender and without masses.  Lymphatics: The femoral and inguinal nodes are not enlarged or tender.  Skin: Normal skin turgor, no visible rash and no visible skin lesions.  Neuro/Psych:. Mood and affect are appropriate.    Assessment Assessed  1. Prostate Cancer 185 2. Male Erectile Disorder Due To Physical Condition 24.84  Plan Male Erectile Disorder Due To Physical Condition (607.84)   1. Follow-up Schedule Surgery Office  Follow-up  Requested for: 13May2014   Inflatable penile prosthesis.  The procedure, risks, benefits were explained to him. The risks include but are not limited to hemorrhage, infection, urethral injury.  He understands that if he has infection we would need to remove the prothesis, malfunction of the prosthesis.  He understands and wishes to proceed.

## 2013-01-09 NOTE — Progress Notes (Signed)
Patient ID: Christopher Riley, male   DOB: 04/01/1951, 62 y.o.   MRN: [id number]   BP: 158/90   P: 74    R:  20   T: 97.7 Has incisional pain.   Foley out.  Has not voided yet. Wound clean. Moderate scrotal swelling. P: Discharge home in AM.

## 2013-01-09 NOTE — Anesthesia Preprocedure Evaluation (Addendum)
Anesthesia Evaluation  Patient identified by MRN, date of birth, ID band Patient awake    Reviewed: Allergy & Precautions, H&P , NPO status , Patient's Chart, lab work & pertinent test results, reviewed documented beta blocker date and time   Airway Mallampati: II TM Distance: >3 FB Neck ROM: Full    Dental  (+) Dental Advisory Given   Pulmonary neg pulmonary ROS, asthma , sleep apnea ,  breath sounds clear to auscultation        Cardiovascular hypertension, Pt. on home beta blockers and Pt. on medications negative cardio ROS  Rhythm:Regular Rate:Normal     Neuro/Psych Seizures -,  PSYCHIATRIC DISORDERS Depression    GI/Hepatic negative GI ROS, Neg liver ROS, GERD-  Medicated,  Endo/Other  diabetes, Type 2, Oral Hypoglycemic AgentsMorbid obesity  Renal/GU negative Renal ROS  negative genitourinary   Musculoskeletal negative musculoskeletal ROS (+)   Abdominal (+) + obese,   Peds negative pediatric ROS (+)  Hematology negative hematology ROS (+)   Anesthesia Other Findings   Reproductive/Obstetrics                          Anesthesia Physical Anesthesia Plan  ASA: III  Anesthesia Plan: General   Post-op Pain Management:    Induction: Intravenous  Airway Management Planned: LMA  Additional Equipment:   Intra-op Plan:   Post-operative Plan: Extubation in OR  Informed Consent: I have reviewed the patients History and Physical, chart, labs and discussed the procedure including the risks, benefits and alternatives for the proposed anesthesia with the patient or authorized representative who has indicated his/her understanding and acceptance.   Dental advisory given  Plan Discussed with: CRNA  Anesthesia Plan Comments:         Anesthesia Quick Evaluation

## 2013-01-09 NOTE — Op Note (Signed)
KITO CUFFE is a 62 y.o.   01/09/2013  General  Preop diagnosis: Erectile dysfunction  Postop diagnosis: Same  Procedure done: Insertion of inflatable penile prosthesis  Surgeon: Wendie Simmer. Tasmia Blumer  Anesthesia: General  Indication: Patient is a 62 years old male who has been complaining of difficulty achieving erections for several years. He had some success with Viagra or Cialis in the past. However recently he has not had any erections with  either one. He has tried penile injections but is not satisfied with them. He has a vacuum device but is not interested in using it anymore. He would like to have a penile prosthesis. The procedure, the risks, benefits were discussed with him. The risks include but are not limited to hemorrhage, infection, malfunction of prosthesis. He understands and is agreeable  Procedure: The patient was identified by his wrist band and proper timeout was taken.  Under general anesthesia he was prepped and draped and placed in the supine position. A #14 French Foley catheter was inserted in the bladder. Clear urine was drained out.  An infrapubic incision was then made. The incision was carried down to the subcutaneous tissues. Hemostasis was secured with electrocautery. The incision was carried down to the corpora cavernosa which were identified and dissected from the surrounding tissues. 2 stay sutures of 0 Vicryl were placed on each corpus. Then a corporotomy was done on the right side. The right corpus was then dilated with Shon Baton dilators up to #12 [place]. The length of the right corpus was found to be 21 cm. A corporotomy was done on the left side and the left corpus was dilated with Brooks dilators up to #12 [place]. The length of the left corpus was on the 22 cm.  The rectus fascia was then identified and incised. The recti muscles were split in the midline and the pouch was made for insertion of the reservoir. Preplaced suture were placed on the rectus  fascia. Then a 75 cc reservoir was placed on the rectus muscle. The rectus fascia was then closed with #0 Vicryl with the preplaced sutures. The  Preplaced sutures of 0 Vicryl were then placed on each corporotomy.  The corpora were then irrigated with bug juice. Then a 20 cm cylinder was placed in the right corpus with a 1 cm rear-tip extender with the Furlow inserter and a Mellody Dance needle.  And a 20 cm cylinder was placed in the left corpus with 2 cm rear-tip extender using the Furlow inserter and a Keith needle.  A 60 cc syringe with normal saline was then attached to the tubing of the cylinders and the cylinders were inflated. The tip of each cylinder was under the glans penis. There was no buckling at the site of corporotomy. Then the cylinders were deflated. The corporotomies were then closed with the preplaced 0 Vicryl sutures.  A scrotal pouch was then bluntly created for placement of the inflate deflate pump. The inflate deflate pump was then placed in the scrotum in the most dependent position.  The reservoir was then filled with 75 cc of normal saline. The tubing of the cylinders was then attached to the tubing of the reservoir using the quick connect. The wound was then irrigated with bug juice. The prosthesis was again inflated and is in good position.  The subcutaneous tissues were then approximated with #3-0 Vicryl and their skin was closed with #4-0 Monocryl using subcuticular sutures.  EBL:  150 cc   Needles, sponges count: Correct on  2 counts.  The patient tolerated the procedure well and left the OR in satisfactory condition to postanesthesia care unit.

## 2013-01-09 NOTE — Transfer of Care (Signed)
Immediate Anesthesia Transfer of Care Note  Patient: Christopher Riley  Procedure(s) Performed: Procedure(s) with comments: PENILE PROSTHESIS  (N/A) - Inflatable Penile Prosthesis Placement  (Coloplast) INFRAPUBIC APPROACH  Patient Location: PACU  Anesthesia Type:General  Level of Consciousness: awake and alert   Airway & Oxygen Therapy: Patient Spontanous Breathing and Patient connected to face mask oxygen  Post-op Assessment: Report given to PACU RN and Post -op Vital signs reviewed and stable  Post vital signs: Reviewed and stable  Complications: No apparent anesthesia complications

## 2013-01-09 NOTE — Anesthesia Postprocedure Evaluation (Signed)
Anesthesia Post Note  Patient: Christopher Riley  Procedure(s) Performed: Procedure(s) (LRB): PENILE PROSTHESIS  (N/A)  Anesthesia type: General  Patient location: PACU  Post pain: Pain level controlled  Post assessment: Post-op Vital signs reviewed  Last Vitals: BP 158/90  Pulse 74  Temp(Src) 36.5 C (Oral)  Resp 20  Ht 6\' 2"  (1.88 m)  Wt 311 lb 3.2 oz (141.159 kg)  BMI 39.94 kg/m2  SpO2 98%  Post vital signs: Reviewed  Level of consciousness: sedated  Complications: No apparent anesthesia complications

## 2013-01-10 ENCOUNTER — Encounter (HOSPITAL_COMMUNITY): Payer: Self-pay | Admitting: Urology

## 2013-01-10 MED ORDER — CEPHALEXIN 500 MG PO CAPS: 500.0000 mg | ORAL_CAPSULE | Freq: Four times a day (QID) | ORAL | Status: DC

## 2013-01-10 NOTE — Discharge Summary (Signed)
Physician Discharge Summary  Patient ID: Christopher Riley MRN: [id number] DOB/AGE: [birth year] 64 y.o.  Admit date: 01/09/2013 Discharge date: 01/10/2013  Admission Diagnoses: Erectile dysfunction.  H/o prostate cancer.  Diabetes.  Hypertension  Discharge Diagnoses: Same Active Problems:   * No active hospital problems. *   Discharged Condition: Improved  Hospital Course: [name] had been having difficulty with erections.  He had used PDE5, penile injections and vacuum.  He prefers to have a penile prosthesis.  He had inflatable penile prosthesis done on 6/17.  Post-Op course is unremarkable.  He has remained afebrile.  He voids well.  He has moderate scrotal swelling.  The prosthesis is in good position.  The infrapubic wound is clean and dry.  He tolerates his diet well.  Consults: None  Significant Diagnostic Studies: None  Treatments:Insertion of inflatable penile prosthesis  Discharge Exam: Blood pressure 113/61, pulse 74, temperature 98.2 F (36.8 C), temperature source Oral, resp. rate 16, height 6\' 2"  (1.88 m), weight 141.159 kg (311 lb 3.2 oz), SpO2 98.00%. Abdomen: Soft. Obese. Infrapubic wound: clean and dry. Moderate scrotal swelling.  Disposition: Discharge home     Medication List    TAKE these medications       ASMANEX 14 METERED DOSES 220 MCG/INH inhaler  Generic drug:  mometasone  Inhale 2 puffs into the lungs daily as needed (asthma).     atenolol 25 MG tablet  Commonly known as:  TENORMIN  Take 25 mg by mouth 2 (two) times daily.     atorvastatin 80 MG tablet  Commonly known as:  LIPITOR  Take 80 mg by mouth every evening.     buPROPion 100 MG tablet  Commonly known as:  WELLBUTRIN  Take 100 mg by mouth 2 (two) times daily.     cephALEXin 500 MG capsule  Commonly known as:  KEFLEX  Take 1 capsule (500 mg total) by mouth 4 (four) times daily.     hydrochlorothiazide 25 MG tablet  Commonly known as:  HYDRODIURIL  Take 25 mg by mouth every  evening.     metFORMIN 1000 MG tablet  Commonly known as:  GLUCOPHAGE  Take 1,000 mg by mouth 3 (three) times daily.     methadone 5 MG tablet  Commonly known as:  DOLOPHINE  Take 5 mg by mouth 2 (two) times daily as needed for pain.     omeprazole 20 MG capsule  Commonly known as:  PRILOSEC  Take 20 mg by mouth daily.     traZODone 50 MG tablet  Commonly known as:  DESYREL  Take 400 mg by mouth at bedtime as needed for sleep. Patient stated that he is to take 8 tablets at bedtime           Follow-up Information   Follow up with Anh Bigos-HENRY, MD In 2 weeks.   Contact information:   [address] [place] [address] [phone]       Signed: Saoirse Legere-HENRY 01/10/2013, 8:10 AM

## 2013-01-10 NOTE — Care Management Note (Signed)
    Page 1 of 1   01/10/2013     10:42:34 AM   CARE MANAGEMENT NOTE 01/10/2013  Patient:  Christopher Riley, Christopher Riley   Account Number:  [id number]  Date Initiated:  01/10/2013  Documentation initiated by:  Lanier Clam  Subjective/Objective Assessment:   ADMITTED W/ERECTILE DYSFUNCTION.     Action/Plan:   FROM HOME.HAS PCP,PHARMACY.   Anticipated DC Date:  01/10/2013   Anticipated DC Plan:  HOME/SELF CARE      DC Planning Services  CM consult      Choice offered to / List presented to:             Status of service:  Completed, signed off Medicare Important Message given?   (If response is "NO", the following Medicare IM given date fields will be blank) Date Medicare IM given:   Date Additional Medicare IM given:    Discharge Disposition:  HOME/SELF CARE  Per UR Regulation:  Reviewed for med. necessity/level of care/duration of stay  If discussed at Long Length of Stay Meetings, dates discussed:    Comments:  01/10/13 Safire Gordin RN,BSN NCM [phone] POD#1 PENILE PROSTHESIS.

## 2013-01-20 ENCOUNTER — Emergency Department (HOSPITAL_COMMUNITY)
Admission: EM | Admit: 2013-01-20 | Discharge: 2013-01-20 | Disposition: A | Discharge status: Home Health | Payer: Medicare Other | Attending: Emergency Medicine | Admitting: Emergency Medicine

## 2013-01-20 ENCOUNTER — Encounter (HOSPITAL_COMMUNITY): Payer: Self-pay | Admitting: Emergency Medicine

## 2013-01-20 DIAGNOSIS — E669 Obesity, unspecified: Secondary | ICD-10-CM | POA: Insufficient documentation

## 2013-01-20 DIAGNOSIS — Y9289 Other specified places as the place of occurrence of the external cause: Secondary | ICD-10-CM | POA: Insufficient documentation

## 2013-01-20 DIAGNOSIS — E7800 Pure hypercholesterolemia, unspecified: Secondary | ICD-10-CM | POA: Insufficient documentation

## 2013-01-20 DIAGNOSIS — G8929 Other chronic pain: Secondary | ICD-10-CM | POA: Insufficient documentation

## 2013-01-20 DIAGNOSIS — I10 Essential (primary) hypertension: Secondary | ICD-10-CM | POA: Insufficient documentation

## 2013-01-20 DIAGNOSIS — Z87448 Personal history of other diseases of urinary system: Secondary | ICD-10-CM | POA: Insufficient documentation

## 2013-01-20 DIAGNOSIS — Z8546 Personal history of malignant neoplasm of prostate: Secondary | ICD-10-CM | POA: Insufficient documentation

## 2013-01-20 DIAGNOSIS — X58XXXA Exposure to other specified factors, initial encounter: Secondary | ICD-10-CM | POA: Insufficient documentation

## 2013-01-20 DIAGNOSIS — N501 Vascular disorders of male genital organs: Secondary | ICD-10-CM | POA: Insufficient documentation

## 2013-01-20 DIAGNOSIS — H409 Unspecified glaucoma: Secondary | ICD-10-CM | POA: Insufficient documentation

## 2013-01-20 DIAGNOSIS — Z8659 Personal history of other mental and behavioral disorders: Secondary | ICD-10-CM | POA: Insufficient documentation

## 2013-01-20 DIAGNOSIS — F3289 Other specified depressive episodes: Secondary | ICD-10-CM | POA: Insufficient documentation

## 2013-01-20 DIAGNOSIS — Z862 Personal history of diseases of the blood and blood-forming organs and certain disorders involving the immune mechanism: Secondary | ICD-10-CM | POA: Insufficient documentation

## 2013-01-20 DIAGNOSIS — Y9389 Activity, other specified: Secondary | ICD-10-CM | POA: Insufficient documentation

## 2013-01-20 DIAGNOSIS — Z8669 Personal history of other diseases of the nervous system and sense organs: Secondary | ICD-10-CM | POA: Insufficient documentation

## 2013-01-20 DIAGNOSIS — N5089 Other specified disorders of the male genital organs: Secondary | ICD-10-CM | POA: Insufficient documentation

## 2013-01-20 DIAGNOSIS — Z9889 Other specified postprocedural states: Secondary | ICD-10-CM | POA: Insufficient documentation

## 2013-01-20 DIAGNOSIS — K219 Gastro-esophageal reflux disease without esophagitis: Secondary | ICD-10-CM | POA: Insufficient documentation

## 2013-01-20 DIAGNOSIS — Z860100 Personal history of colon polyps, unspecified: Secondary | ICD-10-CM | POA: Insufficient documentation

## 2013-01-20 DIAGNOSIS — E119 Type 2 diabetes mellitus without complications: Secondary | ICD-10-CM | POA: Insufficient documentation

## 2013-01-20 DIAGNOSIS — M129 Arthropathy, unspecified: Secondary | ICD-10-CM | POA: Insufficient documentation

## 2013-01-20 DIAGNOSIS — Z8719 Personal history of other diseases of the digestive system: Secondary | ICD-10-CM | POA: Insufficient documentation

## 2013-01-20 DIAGNOSIS — F329 Major depressive disorder, single episode, unspecified: Secondary | ICD-10-CM | POA: Insufficient documentation

## 2013-01-20 DIAGNOSIS — Z8679 Personal history of other diseases of the circulatory system: Secondary | ICD-10-CM | POA: Insufficient documentation

## 2013-01-20 DIAGNOSIS — IMO0002 Reserved for concepts with insufficient information to code with codable children: Secondary | ICD-10-CM | POA: Insufficient documentation

## 2013-01-20 DIAGNOSIS — Z79899 Other long term (current) drug therapy: Secondary | ICD-10-CM | POA: Insufficient documentation

## 2013-01-20 MED ORDER — OXYCODONE-ACETAMINOPHEN 5-325 MG PO TABS
2.0000 | ORAL_TABLET | Freq: Once | ORAL | Status: AC
  Administered 2013-01-20: 2 via ORAL
  Filled 2013-01-20: qty 2

## 2013-01-20 NOTE — Discharge Instructions (Signed)
Avoid scratching area.  Keep area very clean. Wear scrotal support/supportive underwear for comfort and support.  Follow up with your urologist in the next few days. Return to ER if worse, persistent bleeding, worsening swelling, spreading redness, fevers, other concern.   You were given pain medication in the ER - no driving for the next 4 hours.      Scrotal Swelling Scrotal swelling may occur on one or both sides of the scrotum. Pain may also occur with swelling. Early evaluation with your caregiver and treatment is important.  CAUSES   Injury.  Infection.  An ingrown hair or abrasion in the area.  Repeated rubbing from tight-fitting underwear.  Poor hygiene.  A weakened area in the muscles around the groin (hernia). A hernia can allow abdominal contents to push into the scrotum.  Fluid around the testicle (hydrocele).  Enlarged vein around the testicle (varicocele).  Certain medical treatments or existing conditions.  A recent genital surgery or procedure.  The spermatic cord becomes twisted in the scrotum, which cuts off blood supply (testicular torsion).  Testicular cancer. DIAGNOSIS A physical exam and medical history may be performed. You will be asked questions about recent activity and where and when the swelling began. Further tests may be performed to confirm the diagnosis, such as an imaging test (ultrasound or MRI). TREATMENT Treatment will depend on the cause of the swelling. Treatment may include:  Self care (rest, ice, limiting activity, scrotal support).  Pain medicine.  Taking medicines to treat an infection.  Surgery or help from a specialist (urologist). HOME CARE INSTRUCTIONS  Rest and limit activity until the swelling goes away. Lying down is the preferred position.  Put ice on the scrotum.  Put ice in a plastic bag.  Place a towel between your skin and the bag.  Leave the ice on for 15-20 minutes, 3-4 times a day for 1 to 2  days.  Place a rolled towel under the testicles for support.  Wear loose-fitting clothing or an athletic support cup for comfort.  Take all medicines as directed by your caregiver.  Perform a monthly self-exam of the scrotum and penis. Feel for changes. Ask your caregiver how to perform a monthly self-exam if you are unsure. There are a few things you can do to help prevent injury or infection that can lead to swelling:  Wear an athletic support and protective cup during sports activities.  Practice safe sex. Wear condoms.  Follow all of your caregiver's recommendations after a surgical procedure. Discuss appropriate time frames for healing and bedrest with your surgeon.  Get vaccinated against mumps, measles, and rubella (MMR).  Use good body mechanics during activity and when lifting heavy objects. Avoid bearing down or straining.  Drink enough fluids to keep your urine clear or pale yellow. Always empty the bladder completely when urinating to avoid infection. Finding out the results of your test Ask when your test results will be ready. Make sure you get your test results. SEEK MEDICAL CARE IF:  You have a sudden (acute) onset of pain that is persistent and not improving.  You notice a heavy feeling or fluid in the scrotum.  You have pain or burning while urinating.  You have blood in the urine or semen.  You feel a lump around the testicle.  You notice that one testicle is larger than the other (slight variation is normal).  You have a persistent dull ache or pain in the groin or scrotum.  You need instruction  on performing a monthly self-exam. SEEK IMMEDIATE MEDICAL CARE IF:  The pain does not go away or becomes severe.  You have a fever.  You have pain or vomiting that cannot be controlled.  You notice significant redness or swelling of one or both sides of the scrotum. MAKE SURE YOU:  Understand these instructions.  Will watch your condition.  Will get  help right away if you are not doing well or get worse. Document Released: 08/14/2010 Document Revised: 10/04/2011 Document Reviewed: 08/14/2010 [facility] Patient Information 2014 [place], [place].

## 2013-01-20 NOTE — ED Notes (Signed)
Pt had penile pump placed on June 17th. Was in the shower this morning and scrapped off a scab on his left scrotum and has been bleeding.  Per EMS they gave pt 4x4 to help stop the bleeding.
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
## 2013-05-31 ENCOUNTER — Other Ambulatory Visit: Payer: Self-pay

## 2013-06-27 ENCOUNTER — Encounter: Payer: Self-pay | Admitting: Internal Medicine

## 2013-06-27 ENCOUNTER — Ambulatory Visit (INDEPENDENT_AMBULATORY_CARE_PROVIDER_SITE_OTHER): Payer: Medicare Other | Admitting: Internal Medicine

## 2013-06-27 VITALS — BP 126/62 | Temp 97.5°F | Ht 74.5 in | Wt 314.0 lb

## 2013-06-27 DIAGNOSIS — E1149 Type 2 diabetes mellitus with other diabetic neurological complication: Secondary | ICD-10-CM

## 2013-06-27 DIAGNOSIS — N5089 Other specified disorders of the male genital organs: Secondary | ICD-10-CM

## 2013-06-27 DIAGNOSIS — I10 Essential (primary) hypertension: Secondary | ICD-10-CM

## 2013-06-27 DIAGNOSIS — E119 Type 2 diabetes mellitus without complications: Secondary | ICD-10-CM

## 2013-06-27 DIAGNOSIS — M25571 Pain in right ankle and joints of right foot: Secondary | ICD-10-CM

## 2013-06-27 DIAGNOSIS — M109 Gout, unspecified: Secondary | ICD-10-CM

## 2013-06-27 DIAGNOSIS — E785 Hyperlipidemia, unspecified: Secondary | ICD-10-CM

## 2013-06-27 DIAGNOSIS — E1142 Type 2 diabetes mellitus with diabetic polyneuropathy: Secondary | ICD-10-CM

## 2013-06-27 DIAGNOSIS — M25579 Pain in unspecified ankle and joints of unspecified foot: Secondary | ICD-10-CM

## 2013-06-27 LAB — MICROALBUMIN / CREATININE URINE RATIO
Creatinine,U: 135.2 mg/dL
Microalb Creat Ratio: 2.1 mg/g (ref 0.0–30.0)
Microalb, Ur: 2.9 mg/dL — ABNORMAL HIGH (ref 0.0–1.9)

## 2013-06-27 LAB — CBC WITH DIFFERENTIAL/PLATELET
Basophils Absolute: 0 10*3/uL (ref 0.0–0.1)
Basophils Relative: 0.5 % (ref 0.0–3.0)
Eosinophils Absolute: 0.2 10*3/uL (ref 0.0–0.7)
Eosinophils Relative: 3 % (ref 0.0–5.0)
HCT: 41 % (ref 39.0–52.0)
Hemoglobin: 13.1 g/dL (ref 13.0–17.0)
Lymphocytes Relative: 16.3 % (ref 12.0–46.0)
Lymphs Abs: 1.2 10*3/uL (ref 0.7–4.0)
MCHC: 31.9 g/dL (ref 30.0–36.0)
MCV: 80.5 fl (ref 78.0–100.0)
Monocytes Absolute: 0.6 10*3/uL (ref 0.1–1.0)
Monocytes Relative: 7.5 % (ref 3.0–12.0)
Neutro Abs: 5.4 10*3/uL (ref 1.4–7.7)
Neutrophils Relative %: 72.7 % (ref 43.0–77.0)
Platelets: 241 10*3/uL (ref 150.0–400.0)
RBC: 5.1 Mil/uL (ref 4.22–5.81)
RDW: 14.9 % — ABNORMAL HIGH (ref 11.5–14.6)
WBC: 7.4 10*3/uL (ref 4.5–10.5)

## 2013-06-27 LAB — BASIC METABOLIC PANEL
BUN: 12 mg/dL (ref 6–23)
CO2: 29 mEq/L (ref 19–32)
Calcium: 8.9 mg/dL (ref 8.4–10.5)
Chloride: 105 mEq/L (ref 96–112)
Creatinine, Ser: 0.8 mg/dL (ref 0.4–1.5)
GFR: 124.98 mL/min (ref >60.00)
Glucose, Bld: 90 mg/dL (ref 70–99)
Potassium: 3.7 mEq/L (ref 3.5–5.1)
Sodium: 140 mEq/L (ref 135–145)

## 2013-06-27 LAB — LIPID PANEL
Cholesterol: 173 mg/dL (ref 0–200)
HDL: 48.9 mg/dL (ref >39.00)
LDL Cholesterol: 95 mg/dL (ref 0–99)
Total CHOL/HDL Ratio: 4
Triglycerides: 145 mg/dL (ref 0.0–149.0)
VLDL: 29 mg/dL (ref 0.0–40.0)

## 2013-06-27 LAB — HEPATIC FUNCTION PANEL
ALT: 18 U/L (ref 0–53)
AST: 17 U/L (ref 0–37)
Albumin: 3.6 g/dL (ref 3.5–5.2)
Alkaline Phosphatase: 55 U/L (ref 39–117)
Bilirubin, Direct: 0 mg/dL (ref 0.0–0.3)
Total Bilirubin: 0.5 mg/dL (ref 0.3–1.2)
Total Protein: 6.8 g/dL (ref 6.0–8.3)

## 2013-06-27 LAB — TSH: TSH: 2.59 u[IU]/mL (ref 0.35–5.50)

## 2013-06-27 LAB — URIC ACID: Uric Acid, Serum: 4.6 mg/dL (ref 4.0–7.8)

## 2013-06-27 LAB — HEMOGLOBIN A1C: Hgb A1c MFr Bld: 6.8 % — ABNORMAL HIGH (ref 4.6–6.5)

## 2013-06-27 MED ORDER — LOSARTAN POTASSIUM-HCTZ 50-12.5 MG PO TABS: 0.5000 | ORAL_TABLET | Freq: Every day | ORAL | Status: DC

## 2013-06-27 MED ORDER — METOPROLOL TARTRATE 50 MG PO TABS: 50.0000 mg | ORAL_TABLET | Freq: Two times a day (BID) | ORAL | Status: DC

## 2013-06-27 NOTE — Assessment & Plan Note (Signed)
Persistent scrotal swelling after penile implant surgery.  Use supportive garments and follow up with urologist.

## 2013-06-27 NOTE — Assessment & Plan Note (Signed)
Decrease metformin to 1000 mg bid.  Monitor A1c.
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
## 2013-06-27 NOTE — Patient Instructions (Signed)
Stop taking Tenormin and Hydrochlorothiazide Take lower dose of glucophage 1000 mg twice daily with meals

## 2013-06-27 NOTE — Assessment & Plan Note (Addendum)
BP: 126/62 mmHg  Decrease Hctz dose.  Add low dose losartan.  Monitor electrolytes and kidney function.  Replace atenolol with metoprolol

## 2013-06-27 NOTE — Assessment & Plan Note (Signed)
I suspect he has advanced DJD.  Arrange referral to ortho.  Consider repeat x ray especially considering hx of prostate cancer.

## 2013-06-29 ENCOUNTER — Emergency Department (HOSPITAL_COMMUNITY)
Admission: EM | Admit: 2013-06-29 | Discharge: 2013-06-29 | Disposition: A | Discharge status: Home / Self Care | Payer: Medicare Other | Source: Home / Self Care | Attending: Family Medicine | Admitting: Family Medicine

## 2013-06-29 ENCOUNTER — Encounter (HOSPITAL_COMMUNITY): Payer: Self-pay | Admitting: Emergency Medicine

## 2013-06-29 DIAGNOSIS — G8929 Other chronic pain: Secondary | ICD-10-CM

## 2013-06-29 DIAGNOSIS — M79609 Pain in unspecified limb: Secondary | ICD-10-CM

## 2013-06-29 LAB — POCT I-STAT, CHEM 8
BUN: 14 mg/dL (ref 6–23)
Calcium, Ion: 1.18 mmol/L (ref 1.13–1.30)
Chloride: 102 mEq/L (ref 96–112)
Creatinine, Ser: 1.1 mg/dL (ref 0.50–1.35)
Glucose, Bld: 90 mg/dL (ref 70–99)
HCT: 43 % (ref 39.0–52.0)
Hemoglobin: 14.6 g/dL (ref 13.0–17.0)
Potassium: 3.8 mEq/L (ref 3.5–5.1)
Sodium: 141 mEq/L (ref 135–145)
TCO2: 29 mmol/L (ref 0–100)

## 2013-06-29 NOTE — ED Notes (Signed)
Right ankle pain, leg pain.  Patient has been having pain in right leg for 4-5 months.  Patient has positive pedal pulse.  Patient reports he saw his pcp yesterday.  Patient reports physical therapist came today and encouraged him to get leg/foot checked out.patient .  Unclear if anything has occurred recently, long time issue.  Leg has various shades of patient's  Skin tone, skin not particularly hot to touch
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
## 2013-07-02 NOTE — ED Provider Notes (Signed)
Medical screening examination/treatment/procedure(s) were performed by a resident physician or non-physician practitioner and as the supervising physician I was immediately available for consultation/collaboration.  Clementeen Graham, MD    Rodolph Bong, MD 07/02/13 [phone]

## 2013-08-29 ENCOUNTER — Ambulatory Visit: Payer: Medicare Other | Admitting: Internal Medicine

## 2013-10-10 ENCOUNTER — Telehealth (INDEPENDENT_AMBULATORY_CARE_PROVIDER_SITE_OTHER): Payer: Self-pay

## 2013-10-10 NOTE — Telephone Encounter (Signed)
Need to ask patient if he can move appointment up to 2:10pm tomorrow with Dr. Marcello Moores.

## 2013-10-11 ENCOUNTER — Ambulatory Visit (INDEPENDENT_AMBULATORY_CARE_PROVIDER_SITE_OTHER): Payer: Medicare Other | Admitting: General Surgery

## 2013-10-11 VITALS — BP 130/70 | HR 80 | Temp 97.4°F | Resp 16 | Ht 74.5 in | Wt 317.8 lb

## 2013-10-11 DIAGNOSIS — K6289 Other specified diseases of anus and rectum: Secondary | ICD-10-CM

## 2013-10-11 NOTE — Progress Notes (Signed)
Chief Complaint  Patient presents with  . New Evaluation    [place]    HISTORY: Christopher Riley is a 63 y.o. male who presents to the office with occasional anal pain and bleeding.  Other symptoms include changes in GI habits and increased flatus..  This had been occurring for several month.  he has tried nothing in the past.  nothing makes the symptoms worse.   It is intermittent in nature.  His bowel habits are regular and he his bowel movements are usually soft .  His fiber intake is dietary.  His last colonoscopy was in 2010.     Past Medical History  Diagnosis Date  . Post traumatic stress disorder (PTSD)   . DM II (diabetes mellitus, type II), controlled   . Hypertension   . Colon polyps      adenomatous colon polyps 2008 and 2010 [facility]  . Erectile dysfunction   . Right retinal detachment   . Sleep apnea     Doesn't use CPAP  . Hemorrhoids   . Glaucoma   . History of depression   . History of asthma   . Urinary incontinence   . BPH (benign prostatic hypertrophy)   . Anal fissure   . Hypercholesteremia   . Depression   . Asthma   . Chronic pain     back, knees  . Arthritis   . Seizures     in 1980's from medicine  . GERD (gastroesophageal reflux disease)   . Prostate cancer 5 years ago    S/P radioactive seed tx  (Dr. Janice Norrie)   . Iron deficiency anemia     hx of      Past Surgical History  Procedure Laterality Date  . Right knee replacement      1988  . Left knee surgery      [facility], "no knee cap"  . Right shoulder arthroscopic surgery  06/2009    (Dr.  Durward Fortes)  . Transurethral resection of prostate  10/2005  . Cataract extraction    . Penile prosthesis implant N/A 01/09/2013    Procedure: PENILE PROSTHESIS ;  Surgeon: Hanley Ben, MD;  Location: [facility];  Service: Urology;  Laterality: N/A;  Inflatable Penile Prosthesis Placement  ([employer]) INFRAPUBIC APPROACH  . Penile prosthesis implant  01-09-2013        Current Outpatient Prescriptions  Medication  Sig Dispense Refill  . ATENOLOL PO Take by mouth.      Marland Kitchen atorvastatin (LIPITOR) 80 MG tablet Take 80 mg by mouth every evening.      Marland Kitchen buPROPion (WELLBUTRIN) 100 MG tablet Take 100 mg by mouth 2 (two) times daily.       Marland Kitchen CLONAZEPAM PO Take by mouth.      Marland Kitchen HYDROCODONE-ACETAMINOPHEN PO Take by mouth.      . losartan-hydrochlorothiazide (HYZAAR) 50-12.5 MG per tablet Take 0.5 tablets by mouth daily.  30 tablet  1  . MELOXICAM PO Take by mouth.      . metFORMIN (GLUCOPHAGE) 1000 MG tablet Take 1 tablet (1,000 mg total) by mouth 2 (two) times daily with a meal.      . methadone (DOLOPHINE) 5 MG tablet Take 5 mg by mouth 2 (two) times daily as needed for pain.       . mometasone (ASMANEX 14 METERED DOSES) 220 MCG/INH inhaler Inhale 2 puffs into the lungs daily as needed (asthma).       . MORPHINE SULFATE IR PO Take by mouth.      Marland Kitchen  traZODone (DESYREL) 50 MG tablet Take 400 mg by mouth at bedtime as needed for sleep. Patient stated that he is to take 8 tablets at bedtime      . amoxicillin (AMOXIL) 500 MG capsule Take 500 mg by mouth 3 (three) times daily.       No current facility-administered medications for this visit.      No Known Allergies    Family History  Problem Relation Age of Onset  . Diabetes Brother   . Other      no FH of colon cancer    History   Social History  . Marital Status: Married    Spouse Name: N/A    Number of Children: 4  . Years of Education: N/A   Occupational History  . Retired     Former Mudlogger History Main Topics  . Smoking status: Never Smoker   . Smokeless tobacco: Never Used  . Alcohol Use: No     Comment: none since 1988  . Drug Use: Yes     Comment: speed in [place] but never since  . Sexual Activity: Not on file   Other Topics Concern  . Not on file   Social History Narrative   Retired    Married    Never Smoked   Alcohol use-no     Drug use-no            Daily Caffeine Use-1 cup daily      REVIEW OF SYSTEMS -  PERTINENT POSITIVES ONLY: Review of Systems - General ROS: negative for - chills, fever or weight loss Hematological and Lymphatic ROS: negative for - bleeding problems, blood clots or bruising Respiratory ROS: no cough, shortness of breath, or wheezing Cardiovascular ROS: no chest pain or dyspnea on exertion Gastrointestinal ROS: positive for - abdominal pain, blood in stools, change in bowel habits and dark-colored stool negative for - appetite loss, melena or nausea/vomiting Genito-Urinary ROS: no dysuria, trouble voiding, or hematuria  EXAM: Filed Vitals:   10/11/13 1555  BP: 130/70  Pulse: 80  Temp: 97.4 F (36.3 C)  Resp: 16    General appearance: alert and cooperative Resp: clear to auscultation bilaterally Cardio: regular rate and rhythm GI: normal findings: soft, non-tender   Procedure: Anoscopy Surgeon: Marcello Moores Diagnosis: rectal bleeding  Assistant: Dory Larsen After the risks and benefits were explained, verbal consent was obtained for above procedure  Anesthesia: none Findings: no evidence of hemorrhoid disease. He does have some distal rectal inflammation of the mucosal wall. He also has some mucus drainage.    ASSESSMENT AND PLAN: [name] is a 63 year old male with multiple medical problems who is status post hemorrhoidectomy in 2011. He complains of some vague anal pain and occasional blood on the toilet paper as well as some abdominal pain and changes in bowel habits. I have recommended that he be seen by the gastroenterologist at the [facility] to evaluate for repeat colonoscopy. On anoscopy I do not see any signs of recurrent hemorrhoids. He may have some mild radiation proctitis from his prostate treatment. He will call me if anything changes. He stated that he did not need a referral to the [place] and that he was going tomorrow, and he would set that appointment and get evaluated for colonoscopy.  I have instructed him to call the office if he has any trouble with  this.    Rosario Adie, MD Colon and Rectal Surgery / [facility]  Visit Diagnoses: 1. Anal or rectal pain     Primary Care Physician: Drema Pry, DO

## 2013-10-11 NOTE — Patient Instructions (Signed)
Check with the [facility] about getting a appointment with a GI doctor.  Also check with them to see when you are due for your next colonoscopy.    GETTING TO GOOD BOWEL HEALTH. Irregular bowel habits such as constipation can lead to many problems over time.  Having one soft bowel movement a day is the most important way to prevent further problems.  The anorectal canal is designed to handle stretching and feces to safely manage our ability to get rid of solid waste (feces, poop, stool) out of our body.  BUT, hard constipated stools can act like ripping concrete bricks causing inflamed hemorrhoids, anal fissures, abdominal pain and bloating.     The goal: ONE SOFT BOWEL MOVEMENT A DAY!  To have soft, regular bowel movements:    Drink at least 8 tall glasses of water a day.     Take plenty of fiber.  Fiber is the undigested part of plant food that passes into the colon, acting s "natures broom" to encourage bowel motility and movement.  Fiber can absorb and hold large amounts of water. This results in a larger, bulkier stool, which is soft and easier to pass. Work gradually over several weeks up to 6 servings a day of fiber (25g a day even more if needed) in the form of: o Vegetables -- Root (potatoes, carrots, turnips), leafy green (lettuce, salad greens, celery, spinach), or cooked high residue (cabbage, broccoli, etc) o Fruit -- Fresh (unpeeled skin & pulp), Dried (prunes, apricots, cherries, etc ),  or stewed ( applesauce)  o Whole grain breads, pasta, etc (whole wheat)  o Bran cereals    Bulking Agents -- This type of water-retaining fiber generally is easily obtained each day by one of the following:  o Psyllium bran -- The psyllium plant is remarkable because its ground seeds can retain so much water. This product is available as Metamucil, Konsyl, Effersyllium, Per Diem Fiber, or the less expensive generic preparation in drug and health food stores. Although labeled a laxative, it really is not a  laxative.  o Methylcellulose -- This is another fiber derived from wood which also retains water. It is available as Citrucel. o Polyethylene Glycol - and "artificial" fiber commonly called Miralax or Glycolax.  It is helpful for people with gassy or bloated feelings with regular fiber o Flax Seed - a less gassy fiber than psyllium   No reading or other relaxing activity while on the toilet. If bowel movements take longer than 5 minutes, you are too constipated   AVOID CONSTIPATION.  High fiber and water intake usually takes care of this.  Sometimes a laxative is needed to stimulate more frequent bowel movements, but    Laxatives are not a good long-term solution as it can wear the colon out. o Osmotics (Milk of Magnesia, Fleets phosphosoda, Magnesium citrate, MiraLax, GoLytely) are safer than  o Stimulants (Senokot, Castor Oil, Dulcolax, Ex Lax)    o Do not take laxatives for more than 7days in a row.    IF SEVERELY CONSTIPATED, try a Bowel Retraining Program: o Do not use laxatives.  o Eat a diet high in roughage, such as bran cereals and leafy vegetables.  o Drink six (6) ounces of prune or apricot juice each morning.  o Eat two (2) large servings of stewed fruit each day.  o Take one (1) heaping tablespoon of a psyllium-based bulking agent twice a day. Use sugar-free sweetener when possible to avoid excessive calories.  o Eat  a normal breakfast.  o Set aside 15 minutes after breakfast to sit on the toilet, but do not strain to have a bowel movement.  o If you do not have a bowel movement by the third day, use an enema and repeat the above steps.

## 2013-11-14 ENCOUNTER — Ambulatory Visit: Payer: Medicare Other | Admitting: Internal Medicine

## 2013-12-14 ENCOUNTER — Other Ambulatory Visit: Payer: Self-pay | Admitting: Urology

## 2013-12-21 ENCOUNTER — Encounter (HOSPITAL_BASED_OUTPATIENT_CLINIC_OR_DEPARTMENT_OTHER): Payer: Self-pay | Admitting: *Deleted

## 2013-12-24 ENCOUNTER — Encounter (HOSPITAL_BASED_OUTPATIENT_CLINIC_OR_DEPARTMENT_OTHER): Payer: Self-pay | Admitting: *Deleted

## 2013-12-24 NOTE — Progress Notes (Addendum)
NPO AFTER MN. ARRIVE AT 0930.  NEEDS ISTAT 8 AND EKG.  PT WALKS W/ BRACES AND CRUTCHES.  PT TO CALL BACK WITH MED. LIST.  WILL DO HIBICLENS SHOWER HS BEFORE AND AM DOS.

## 2013-12-25 ENCOUNTER — Other Ambulatory Visit: Payer: Self-pay | Admitting: Urology

## 2013-12-25 NOTE — Progress Notes (Addendum)
Pt informed to do hibiclens shower hs and am of surgery. Pt npo p mn except atenolol, omeprazole, methadone w sip of water.other instructions remain the same. Pt  Verbalized his understanding.  Pt to use albuterol inhaler the am of surgery and bring it with him.

## 2013-12-27 MED ORDER — CHLORHEXIDINE GLUCONATE 4 % EX LIQD: CUTANEOUS | Status: AC

## 2013-12-28 ENCOUNTER — Encounter (HOSPITAL_BASED_OUTPATIENT_CLINIC_OR_DEPARTMENT_OTHER): Payer: Self-pay | Admitting: *Deleted

## 2013-12-28 ENCOUNTER — Ambulatory Visit (HOSPITAL_BASED_OUTPATIENT_CLINIC_OR_DEPARTMENT_OTHER)
Admission: RE | Admit: 2013-12-28 | Discharge: 2013-12-28 | Disposition: A | Discharge status: Home / Self Care | Payer: Medicare Other | Source: Ambulatory Visit | Attending: Urology | Admitting: Urology

## 2013-12-28 ENCOUNTER — Ambulatory Visit (HOSPITAL_BASED_OUTPATIENT_CLINIC_OR_DEPARTMENT_OTHER): Payer: Medicare Other | Admitting: Anesthesiology

## 2013-12-28 ENCOUNTER — Encounter (HOSPITAL_BASED_OUTPATIENT_CLINIC_OR_DEPARTMENT_OTHER)
Admission: RE | Disposition: A | Discharge status: Home / Self Care | Payer: Self-pay | Source: Ambulatory Visit | Attending: Urology

## 2013-12-28 ENCOUNTER — Other Ambulatory Visit: Payer: Self-pay

## 2013-12-28 ENCOUNTER — Encounter (HOSPITAL_BASED_OUTPATIENT_CLINIC_OR_DEPARTMENT_OTHER): Payer: Medicare Other | Admitting: Anesthesiology

## 2013-12-28 DIAGNOSIS — F329 Major depressive disorder, single episode, unspecified: Secondary | ICD-10-CM | POA: Insufficient documentation

## 2013-12-28 DIAGNOSIS — Z96659 Presence of unspecified artificial knee joint: Secondary | ICD-10-CM | POA: Insufficient documentation

## 2013-12-28 DIAGNOSIS — E669 Obesity, unspecified: Secondary | ICD-10-CM | POA: Insufficient documentation

## 2013-12-28 DIAGNOSIS — G4730 Sleep apnea, unspecified: Secondary | ICD-10-CM | POA: Insufficient documentation

## 2013-12-28 DIAGNOSIS — N5089 Other specified disorders of the male genital organs: Secondary | ICD-10-CM | POA: Insufficient documentation

## 2013-12-28 DIAGNOSIS — F411 Generalized anxiety disorder: Secondary | ICD-10-CM | POA: Insufficient documentation

## 2013-12-28 DIAGNOSIS — J45909 Unspecified asthma, uncomplicated: Secondary | ICD-10-CM | POA: Insufficient documentation

## 2013-12-28 DIAGNOSIS — K219 Gastro-esophageal reflux disease without esophagitis: Secondary | ICD-10-CM | POA: Insufficient documentation

## 2013-12-28 DIAGNOSIS — Z6841 Body Mass Index (BMI) 40.0 and over, adult: Secondary | ICD-10-CM | POA: Insufficient documentation

## 2013-12-28 DIAGNOSIS — H409 Unspecified glaucoma: Secondary | ICD-10-CM | POA: Insufficient documentation

## 2013-12-28 DIAGNOSIS — M129 Arthropathy, unspecified: Secondary | ICD-10-CM | POA: Insufficient documentation

## 2013-12-28 DIAGNOSIS — F3289 Other specified depressive episodes: Secondary | ICD-10-CM | POA: Insufficient documentation

## 2013-12-28 DIAGNOSIS — I10 Essential (primary) hypertension: Secondary | ICD-10-CM | POA: Insufficient documentation

## 2013-12-28 DIAGNOSIS — Z79899 Other long term (current) drug therapy: Secondary | ICD-10-CM | POA: Insufficient documentation

## 2013-12-28 DIAGNOSIS — Z86718 Personal history of other venous thrombosis and embolism: Secondary | ICD-10-CM | POA: Insufficient documentation

## 2013-12-28 HISTORY — DX: Other specified disorders of the male genital organs: N50.89

## 2013-12-28 HISTORY — PX: SCROTECTOMY: SHX6207

## 2013-12-28 HISTORY — DX: Personal history of malignant neoplasm of prostate: Z85.46

## 2013-12-28 HISTORY — DX: Personal history of peptic ulcer disease: Z87.11

## 2013-12-28 HISTORY — DX: Obstructive sleep apnea (adult) (pediatric): G47.33

## 2013-12-28 HISTORY — DX: Presence of spectacles and contact lenses: Z97.3

## 2013-12-28 HISTORY — DX: Personal history of adenomatous and serrated colon polyps: Z86.0101

## 2013-12-28 HISTORY — DX: Type 2 diabetes mellitus with diabetic polyneuropathy: E11.42

## 2013-12-28 HISTORY — DX: Dependence on other enabling machines and devices: Z99.89

## 2013-12-28 HISTORY — DX: Unspecified asthma, uncomplicated: J45.909

## 2013-12-28 HISTORY — DX: Nocturia: R35.1

## 2013-12-28 HISTORY — DX: Hyperlipidemia, unspecified: E78.5

## 2013-12-28 HISTORY — DX: Unspecified hearing loss, unspecified ear: H91.90

## 2013-12-28 HISTORY — DX: Unqualified visual loss, right eye, normal vision left eye: H54.61

## 2013-12-28 HISTORY — DX: Personal history of other diseases of the digestive system: Z87.19

## 2013-12-28 HISTORY — DX: Personal history of colonic polyps: Z86.010

## 2013-12-28 HISTORY — DX: Reserved for inherently not codable concepts without codable children: IMO0001

## 2013-12-28 HISTORY — DX: Type 2 diabetes mellitus without complications: E11.9

## 2013-12-28 HISTORY — DX: Contact with and (suspected) exposure to other war theater: Z77.39

## 2013-12-28 HISTORY — DX: Contact with and (suspected) exposure to other hazardous, chiefly nonmedicinal, chemicals: Z77.098

## 2013-12-28 HISTORY — DX: Personal history of other specified conditions: Z87.898

## 2013-12-28 LAB — POCT I-STAT, CHEM 8
BUN: 16 mg/dL (ref 6–23)
Calcium, Ion: 1.22 mmol/L (ref 1.13–1.30)
Chloride: 102 mEq/L (ref 96–112)
Creatinine, Ser: 1 mg/dL (ref 0.50–1.35)
Glucose, Bld: 102 mg/dL — ABNORMAL HIGH (ref 70–99)
HCT: 42 % (ref 39.0–52.0)
Hemoglobin: 14.3 g/dL (ref 13.0–17.0)
Potassium: 3.9 mEq/L (ref 3.7–5.3)
Sodium: 145 mEq/L (ref 137–147)
TCO2: 26 mmol/L (ref 0–100)

## 2013-12-28 LAB — GLUCOSE, CAPILLARY: Glucose-Capillary: 94 mg/dL (ref 70–99)

## 2013-12-28 SURGERY — EXCISION, SCROTUM
Anesthesia: General | Site: Scrotum

## 2013-12-28 MED ORDER — FENTANYL CITRATE 0.05 MG/ML IJ SOLN
INTRAMUSCULAR | Status: AC
  Filled 2013-12-28: qty 6

## 2013-12-28 MED ORDER — EPHEDRINE SULFATE 50 MG/ML IJ SOLN
INTRAMUSCULAR | Status: DC | PRN
  Administered 2013-12-28 (×2): 10 mg via INTRAVENOUS

## 2013-12-28 MED ORDER — LACTATED RINGERS IV SOLN
INTRAVENOUS | Status: DC
  Filled 2013-12-28: qty 1000

## 2013-12-28 MED ORDER — FENTANYL CITRATE 0.05 MG/ML IJ SOLN
INTRAMUSCULAR | Status: DC | PRN
  Administered 2013-12-28: 50 ug via INTRAVENOUS
  Administered 2013-12-28: 100 ug via INTRAVENOUS

## 2013-12-28 MED ORDER — DEXTROSE 5 % IV SOLN
540.0000 mg | Freq: Once | INTRAVENOUS | Status: AC
  Administered 2013-12-28: 540 mg via INTRAVENOUS
  Filled 2013-12-28: qty 13.5

## 2013-12-28 MED ORDER — LIDOCAINE HCL (CARDIAC) 20 MG/ML IV SOLN
INTRAVENOUS | Status: DC | PRN
  Administered 2013-12-28: 80 mg via INTRAVENOUS

## 2013-12-28 MED ORDER — CEPHALEXIN 500 MG PO CAPS
500.0000 mg | ORAL_CAPSULE | Freq: Four times a day (QID) | ORAL | Status: DC
Start: 2013-12-28 — End: 2015-02-12

## 2013-12-28 MED ORDER — GENTAMICIN IN SALINE 1-0.9 MG/ML-% IV SOLN
100.0000 mg | INTRAVENOUS | Status: DC
  Filled 2013-12-28: qty 100

## 2013-12-28 MED ORDER — PROMETHAZINE HCL 25 MG/ML IJ SOLN
6.2500 mg | INTRAMUSCULAR | Status: DC | PRN
  Filled 2013-12-28: qty 1

## 2013-12-28 MED ORDER — DEXAMETHASONE SODIUM PHOSPHATE 4 MG/ML IJ SOLN
INTRAMUSCULAR | Status: DC | PRN
  Administered 2013-12-28: 5 mg via INTRAVENOUS

## 2013-12-28 MED ORDER — PROPOFOL 10 MG/ML IV BOLUS
INTRAVENOUS | Status: DC | PRN
  Administered 2013-12-28: 200 mg via INTRAVENOUS

## 2013-12-28 MED ORDER — MEPERIDINE HCL 25 MG/ML IJ SOLN
6.2500 mg | INTRAMUSCULAR | Status: DC | PRN
  Filled 2013-12-28: qty 1

## 2013-12-28 MED ORDER — ONDANSETRON HCL 4 MG/2ML IJ SOLN
INTRAMUSCULAR | Status: DC | PRN
  Administered 2013-12-28: 4 mg via INTRAVENOUS

## 2013-12-28 MED ORDER — MIDAZOLAM HCL 5 MG/5ML IJ SOLN
INTRAMUSCULAR | Status: DC | PRN
  Administered 2013-12-28: 2 mg via INTRAVENOUS

## 2013-12-28 MED ORDER — FENTANYL CITRATE 0.05 MG/ML IJ SOLN
25.0000 ug | INTRAMUSCULAR | Status: DC | PRN
  Filled 2013-12-28: qty 1

## 2013-12-28 MED ORDER — LACTATED RINGERS IV SOLN
INTRAVENOUS | Status: DC
  Administered 2013-12-28 (×3): via INTRAVENOUS
  Filled 2013-12-28: qty 1000

## 2013-12-28 MED ORDER — CEFAZOLIN SODIUM 1-5 GM-% IV SOLN
1.0000 g | INTRAVENOUS | Status: DC
  Filled 2013-12-28: qty 50

## 2013-12-28 MED ORDER — MIDAZOLAM HCL 2 MG/2ML IJ SOLN
INTRAMUSCULAR | Status: AC
  Filled 2013-12-28: qty 2

## 2013-12-28 MED ORDER — HYDROCODONE-ACETAMINOPHEN 5-325 MG PO TABS
1.0000 | ORAL_TABLET | Freq: Four times a day (QID) | ORAL | Status: DC | PRN
  Administered 2013-12-28: 1 via ORAL
  Filled 2013-12-28: qty 1

## 2013-12-28 MED ORDER — DEXTROSE 5 % IV SOLN
3.0000 g | INTRAVENOUS | Status: AC
  Administered 2013-12-28: 3 g via INTRAVENOUS
  Filled 2013-12-28: qty 3000

## 2013-12-28 SURGICAL SUPPLY — 53 items
APPLICATOR COTTON TIP 6IN STRL (MISCELLANEOUS) ×3 IMPLANT
BANDAGE GAUZE ELAST BULKY 4 IN (GAUZE/BANDAGES/DRESSINGS) ×2 IMPLANT
BLADE SURG 15 STRL LF DISP TIS (BLADE) ×1 IMPLANT
BLADE SURG 15 STRL SS (BLADE) ×3
BLADE SURG ROTATE 9660 (MISCELLANEOUS) ×3 IMPLANT
BNDG GAUZE ELAST 4 BULKY (GAUZE/BANDAGES/DRESSINGS) ×3 IMPLANT
CANISTER SUCTION 1200CC (MISCELLANEOUS) ×1 IMPLANT
CANISTER SUCTION 2500CC (MISCELLANEOUS) ×3 IMPLANT
CLEANER CAUTERY TIP 5X5 PAD (MISCELLANEOUS) IMPLANT
CLOTH BEACON ORANGE TIMEOUT ST (SAFETY) ×1 IMPLANT
COVER MAYO STAND STRL (DRAPES) ×3 IMPLANT
COVER TABLE BACK 60X90 (DRAPES) ×6 IMPLANT
DRAIN PENROSE 18X1/2 LTX STRL (DRAIN) ×2 IMPLANT
DRAIN PENROSE 18X1/4 LTX STRL (WOUND CARE) ×3 IMPLANT
DRAPE PED LAPAROTOMY (DRAPES) ×3 IMPLANT
ELECT NDL TIP 2.8 STRL (NEEDLE) ×1 IMPLANT
ELECT NEEDLE TIP 2.8 STRL (NEEDLE) ×3 IMPLANT
ELECT REM PT RETURN 9FT ADLT (ELECTROSURGICAL) ×3
ELECTRODE REM PT RTRN 9FT ADLT (ELECTROSURGICAL) ×1 IMPLANT
GLOVE BIO SURGEON STRL SZ7 (GLOVE) ×3 IMPLANT
GLOVE BIOGEL M 6.5 STRL (GLOVE) ×2 IMPLANT
GLOVE BIOGEL M STRL SZ7.5 (GLOVE) ×2 IMPLANT
GLOVE BIOGEL PI IND STRL 6.5 (GLOVE) IMPLANT
GLOVE BIOGEL PI IND STRL 7.5 (GLOVE) IMPLANT
GLOVE BIOGEL PI INDICATOR 6.5 (GLOVE) ×2
GLOVE BIOGEL PI INDICATOR 7.5 (GLOVE) ×2
GOWN PREVENTION PLUS LG XLONG (DISPOSABLE) ×2 IMPLANT
GOWN STRL REUS W/TWL LRG LVL3 (GOWN DISPOSABLE) ×4 IMPLANT
GOWN STRL REUS W/TWL XL LVL3 (GOWN DISPOSABLE) ×2 IMPLANT
NEEDLE HYPO 25X1 1.5 SAFETY (NEEDLE) ×3 IMPLANT
NS IRRIG 500ML POUR BTL (IV SOLUTION) ×3 IMPLANT
PACK BASIN DAY SURGERY FS (CUSTOM PROCEDURE TRAY) ×3 IMPLANT
PAD CLEANER CAUTERY TIP 5X5 (MISCELLANEOUS) ×2
PENCIL BUTTON HOLSTER BLD 10FT (ELECTRODE) ×3 IMPLANT
SPONGE GAUZE 4X4 12PLY (GAUZE/BANDAGES/DRESSINGS) ×2 IMPLANT
SPONGE LAP 18X18 X RAY DECT (DISPOSABLE) ×4 IMPLANT
SUPPORT SCROTAL LG STRP (MISCELLANEOUS) ×1 IMPLANT
SUPPORTER ATHLETIC LG (MISCELLANEOUS)
SUT MNCRL AB 4-0 PS2 18 (SUTURE) ×1 IMPLANT
SUT SILK 0 SH 30 (SUTURE) IMPLANT
SUT SILK 0 TIES 10X30 (SUTURE) ×1 IMPLANT
SUT VIC AB 0 CT1 36 (SUTURE) ×2 IMPLANT
SUT VIC AB 3-0 PS2 18 (SUTURE) ×3
SUT VIC AB 3-0 PS2 18XBRD (SUTURE) IMPLANT
SUT VIC AB 3-0 SH 27 (SUTURE) ×9
SUT VIC AB 3-0 SH 27X BRD (SUTURE) ×3 IMPLANT
SYR BULB IRRIGATION 50ML (SYRINGE) ×2 IMPLANT
SYR CONTROL 10ML LL (SYRINGE) ×3 IMPLANT
TOWEL OR 17X24 6PK STRL BLUE (TOWEL DISPOSABLE) ×6 IMPLANT
TUBE CONNECTING 12'X1/4 (SUCTIONS) ×1
TUBE CONNECTING 12X1/4 (SUCTIONS) ×2 IMPLANT
WATER STERILE IRR 500ML POUR (IV SOLUTION) IMPLANT
YANKAUER SUCT BULB TIP NO VENT (SUCTIONS) ×3 IMPLANT

## 2013-12-28 NOTE — Anesthesia Postprocedure Evaluation (Signed)
  Anesthesia Post-op Note  Patient: Christopher Riley  Procedure(s) Performed: Procedure(s) (LRB): SCROTOPLASTY (N/A)  Patient Location: PACU  Anesthesia Type: General  Level of Consciousness: awake and alert   Airway and Oxygen Therapy: Patient Spontanous Breathing  Post-op Pain: mild  Post-op Assessment: Post-op Vital signs reviewed, Patient's Cardiovascular Status Stable, Respiratory Function Stable, Patent Airway and No signs of Nausea or vomiting  Last Vitals:  Filed Vitals:   12/28/13 1445  BP:   Pulse:   Temp: 36.5 C  Resp:     Post-op Vital Signs: stable   Complications: No apparent anesthesia complications

## 2013-12-28 NOTE — Discharge Instructions (Signed)
Ice packs to scrotum.  No lifting or straining. Post Anesthesia Home Care Instructions  Activity: Get plenty of rest for the remainder of the day. A responsible adult should stay with you for 24 hours following the procedure.  For the next 24 hours, DO NOT: -Drive a car -Paediatric nurse -Drink alcoholic beverages -Take any medication unless instructed by your physician -Make any legal decisions or sign important papers.  Meals: Start with liquid foods such as gelatin or soup. Progress to regular foods as tolerated. Avoid greasy, spicy, heavy foods. If nausea and/or vomiting occur, drink only clear liquids until the nausea and/or vomiting subsides. Call your physician if vomiting continues.  Special Instructions/Symptoms: Your throat may feel dry or sore from the anesthesia or the breathing tube placed in your throat during surgery. If this causes discomfort, gargle with warm salt water. The discomfort should disappear within 24 hours. HOME CARE INSTRUCTIONS FOR SCROTAL PROCEDURES  Wound Care & Hygiene: You may apply an ice bag to the scrotum for the first 24 hours.  This may help decrease swelling and soreness.  You may have a dressing held in place by an athletic supporter.  You may remove the dressing in 24 hours and shower in 48 hours.  Continue to use the athletic supporter or tight briefs for at least a week.  Activity: Rest today - not necessarily flat bed rest.  Just take it easy.  You should not do strenuous activities until your follow-up visit with your doctor.  You may resume light activity in 48 hours.  Return to Work:  Your doctor will advise you of this depending on the type of work you do  Diet: Drink liquids or eat a light diet this evening.  You may resume a regular diet tomorrow.  General Expectations: You may have a small amount of bleeding.  The scrotum may be swollen or bruised for about a week.  Call your Doctor if these occur:  -persistent or heavy  bleeding  -temperature of 101 degrees or more  -severe pain, not relieved by your pain medication  Return to [place]:  *** Call to set up and appointment.  Patient Signature:  __________________________________________________  Nurse's Signature:  __________________________________________________

## 2013-12-28 NOTE — Transfer of Care (Signed)
Immediate Anesthesia Transfer of Care Note  Patient: Christopher Riley  Procedure(s) Performed: Procedure(s): SCROTOPLASTY (N/A)  Patient Location: PACU  Anesthesia Type:General  Level of Consciousness: sedated  Airway & Oxygen Therapy: Patient Spontanous Breathing and Patient connected to face mask oxygen  Post-op Assessment: Report given to PACU RN and Post -op Vital signs reviewed and stable  Post vital signs: Reviewed and stable  Complications: No apparent anesthesia complications

## 2013-12-28 NOTE — Anesthesia Procedure Notes (Signed)
Procedure Name: LMA Insertion Date/Time: 12/28/2013 11:11 AM Performed by: Maryella Shivers Pre-anesthesia Checklist: Patient identified, Emergency Drugs available, Suction available and Patient being monitored Patient Re-evaluated:Patient Re-evaluated prior to inductionOxygen Delivery Method: Circle System Utilized Preoxygenation: Pre-oxygenation with 100% oxygen Intubation Type: IV induction Ventilation: Mask ventilation without difficulty LMA: LMA inserted LMA Size: 5.0 Number of attempts: 1 Airway Equipment and Method: bite block Placement Confirmation: positive ETCO2 Tube secured with: Tape Dental Injury: Teeth and Oropharynx as per pre-operative assessment

## 2013-12-28 NOTE — Anesthesia Preprocedure Evaluation (Addendum)
Anesthesia Evaluation  Patient identified by MRN, date of birth, ID band Patient awake    Reviewed: Allergy & Precautions, H&P , NPO status , Patient's Chart, lab work & pertinent test results, reviewed documented beta blocker date and time   Airway Mallampati: II TM Distance: >3 FB Neck ROM: Full    Dental  (+) Dental Advisory Given   Pulmonary neg pulmonary ROS, asthma , sleep apnea ,  breath sounds clear to auscultation        Cardiovascular hypertension, Pt. on home beta blockers and Pt. on medications negative cardio ROS  Rhythm:Regular Rate:Normal     Neuro/Psych PSYCHIATRIC DISORDERS Depression    GI/Hepatic negative GI ROS, Neg liver ROS, GERD-  Medicated,  Endo/Other  diabetes, Type 2, Oral Hypoglycemic AgentsMorbid obesity  Renal/GU negative Renal ROS  negative genitourinary   Musculoskeletal negative musculoskeletal ROS (+)   Abdominal (+) + obese,   Peds negative pediatric ROS (+)  Hematology negative hematology ROS (+)   Anesthesia Other Findings   Reproductive/Obstetrics                           Anesthesia Physical  Anesthesia Plan  ASA: III  Anesthesia Plan: General   Post-op Pain Management:    Induction: Intravenous  Airway Management Planned: LMA  Additional Equipment:   Intra-op Plan:   Post-operative Plan: Extubation in OR  Informed Consent: I have reviewed the patients History and Physical, chart, labs and discussed the procedure including the risks, benefits and alternatives for the proposed anesthesia with the patient or authorized representative who has indicated his/her understanding and acceptance.   Dental advisory given  Plan Discussed with: CRNA  Anesthesia Plan Comments:         Anesthesia Quick Evaluation

## 2013-12-28 NOTE — H&P (Signed)
History of Present Illness Christopher Riley returns for follow-up. He had an inflatable penile prosthesis in June 2014 with a huge scrotal hematoma post-op. The hematoma has resolved. However the scrotum is still swollen. The most dependent portion of the scrotum is edematous. He has discomfort while walking. He would like to have a smaller scrotum.I asked Dr Gaynelle Arabian to see him with me in consultation and he agrees that he could benefit from a scrotoplasty. He had GI bleeding three weeks ago and he had an unremarkable colonoscopy. He is due for an upper endoscopy on June 5. He has a history of prostate cancer and had IMRT 5 years ago.   Past Medical History Problems  1. History of Anxiety (300.00) 2. History of Arthritis (V13.4) 3. History of Asthma (493.90) 4. History of Coagulation Defects (286.9) 5. History of depression (V11.8) 6. History of esophageal reflux (V12.79) 7. History of glaucoma (V12.49) 8. History of sleep apnea (V13.89) 9. History of Peptic Ulcer (V12.71) 10. History of Venous Thrombosis (V12.51)  Surgical History Problems  1. History of Knee Replacement 2. History of Knee Surgery 3. History of Surg Penis Insertion Of Penile Prosthesis  Current Meds 1. Accu-Chek Aviva Plus In Vitro Strip;  Therapy: [id number] to Recorded 2. Atenolol TABS;  Therapy: (Recorded:13May2014) to Recorded 3. Atorvastatin Calcium 80 MG Oral Tablet;  Therapy: (Recorded:13May2014) to Recorded 4. BuPROPion HCl ER (SR) 200 MG Oral Tablet Extended Release 12 Hour;  Therapy: [id number] to Recorded 5. Hydrochlorothiazide 25 MG Oral Tablet;  Therapy: (Recorded:13May2014) to Recorded 6. MetFORMIN HCl TABS;  Therapy: (Recorded:13Dec2011) to Recorded 7. Methadone HCl - 5 MG Oral Tablet;  Therapy: (Recorded:04Mar2008) to Recorded 8. Omeprazole 20 MG Oral Capsule Delayed Release;  Therapy: (Recorded:13May2014) to Recorded 9. QUEtiapine Fumarate 300 MG Oral Tablet;  Therapy: 11Oct2011 to Recorded 10.  TraZODone HCl - 50 MG Oral Tablet;   Therapy: (Recorded:13May2014) to Recorded  Allergies Medication  1. No Known Drug Allergies  Family History Problems  1. Family history of Diabetes Mellitus (V18.0) : Brother 2. Family history of Family Health Status Number Of Children   2 sons 2 daughters 3. Family history of Hypertension (V17.49) : Brother  Social History Problems  1. Denied: Alcohol Use 2. Marital History - Currently Married 3. Never A Smoker 4. Denied: Tobacco Use  Review of Systems Genitourinary, constitutional, skin, eye, otolaryngeal, hematologic/lymphatic, cardiovascular, pulmonary, endocrine, musculoskeletal, gastrointestinal, neurological and psychiatric system(s) were reviewed and pertinent findings if present are noted.    Physical Exam Constitutional: Well nourished and well developed . No acute distress.  ENT:. The ears and nose are normal in appearance.  Neck: The appearance of the neck is normal and no neck mass is present.  Pulmonary: No respiratory distress and normal respiratory rhythm and effort.  Cardiovascular: Heart rate and rhythm are normal . No peripheral edema.  Abdomen: The abdomen is soft and nontender. No masses are palpated. No CVA tenderness. No hernias are palpable. No hepatosplenomegaly noted.  Genitourinary: Examination of the penis demonstrates no discharge, no masses, no lesions and a normal meatus. The scrotum is without lesions. The right epididymis is palpably normal and non-tender. The left epididymis is palpably normal and non-tender. The right testis is non-tender and without masses. The left testis is non-tender and without masses. The prosthesis functions well. The most dependent scrotum is edematous.  There is no fluctuation. The testicles are normal. The inflate deflate pump is the right scrotum.  Lymphatics: The femoral and inguinal nodes are not enlarged or tender.  Skin: Normal skin turgor, no visible rash and no visible skin  lesions.  Neuro/Psych:. Mood and affect are appropriate.    Results/Data Urine [Data Includes: Last 1 Day]   [id number]  COLOR YELLOW   APPEARANCE CLEAR   SPECIFIC GRAVITY 1.025   pH 5.5   GLUCOSE NEG mg/dL  BILIRUBIN NEG   KETONE NEG mg/dL  BLOOD LARGE   PROTEIN TRACE mg/dL  UROBILINOGEN 0.2 mg/dL  NITRITE NEG   LEUKOCYTE ESTERASE NEG   SQUAMOUS EPITHELIAL/HPF RARE   WBC 0-2 WBC/hpf  RBC 11-20 RBC/hpf  BACTERIA RARE   CRYSTALS NONE SEEN   CASTS NONE SEEN   Other MUCUS NOTED    Assessment Assessed  1. Prostate cancer (185) 2. Urinary urgency (788.63) 3. Scrotal edema (608.86)  Plan Health Maintenance  1. UA With REFLEX; [Do Not Release]; Status:Resulted - Requires Verification;   Done:  [id number] 10:35AM  Scrotoplasty. The procedure, risks, benefits were explained to the patient. The risks include but are not limited to hemorrhage, hematoma, infection. He understands that there is a risk of prosthesis infection, which would require removal of the penile prosthesis. He understands and wishes to proceed.

## 2013-12-28 NOTE — Op Note (Signed)
MAXIMILLION GILL is a 63 y.o.   12/28/2013  General  Pre-op diagnosis: Scrotal swelling, scrotal edema  Postop diagnosis same  Procedure done: Reduction scrotoplasty  Surgeon: Charlene Brooke. Stephany Poorman  Assistant.: Dr. Carolan Clines  Anesthesia: General  Indication: Patient is a 63 years old male who had an inflatable penile prosthesis a year ago. He developed a large scrotal hematoma postop. The hematoma has has been resolved. However he continues to have some scrotal swelling. He states that the swelling bothers him and wanted the scrotum to be smaller. He is scheduled today for scrotoplasty  Procedure: Patient was identified by his wrist band and proper timeout was taken.  Under general anesthesia he was prepped and draped and placed in the supine position. With a marker pen I delineated an elliptical area of the scrotum from the base of the penis to the perineum. With the Bovie I excised the redundant scrotum down to the tunica vaginalis. The tunica vaginalis was not incised. The inflate deflate pump is in good position in the scrotum. I stayed away from the pump. The incision was not near the inflate deflate pump. Hemostasis was secured with electrocautery. 2 Penrose drains were then placed in the scrotum and brought out through separate stab wounds. The drains were placed in the superficial and deep layers of the closure. There was no evidence of bleeding. The scrotal layers were then re approximated in 3 layers with #3-0 Vicryl. The skin was then closed with #3-0 Vicryl.  EBL: Minimal  Needles, sponges count: Correct on 2 counts  Patient tolerated the procedure well and left the OR in satisfactory condition to postanesthesia care unit

## 2014-01-02 ENCOUNTER — Encounter (HOSPITAL_BASED_OUTPATIENT_CLINIC_OR_DEPARTMENT_OTHER): Payer: Self-pay | Admitting: Urology

## 2014-05-23 ENCOUNTER — Telehealth: Payer: Self-pay

## 2014-05-23 NOTE — Telephone Encounter (Signed)
Spoke to pt and pt declined making the appt.

## 2015-01-03 DIAGNOSIS — C61 Malignant neoplasm of prostate: Secondary | ICD-10-CM | POA: Diagnosis not present

## 2015-01-09 DIAGNOSIS — C61 Malignant neoplasm of prostate: Secondary | ICD-10-CM | POA: Diagnosis not present

## 2015-01-20 ENCOUNTER — Other Ambulatory Visit: Payer: Self-pay

## 2015-02-11 ENCOUNTER — Encounter: Payer: Self-pay | Admitting: Internal Medicine

## 2015-02-12 ENCOUNTER — Encounter: Payer: Self-pay | Admitting: Internal Medicine

## 2015-02-12 ENCOUNTER — Ambulatory Visit (INDEPENDENT_AMBULATORY_CARE_PROVIDER_SITE_OTHER): Payer: Medicare Other | Admitting: Internal Medicine

## 2015-02-12 VITALS — BP 150/80 | HR 75 | Temp 98.6°F | Resp 20 | Ht 74.0 in | Wt 309.0 lb

## 2015-02-12 DIAGNOSIS — M25571 Pain in right ankle and joints of right foot: Secondary | ICD-10-CM | POA: Diagnosis not present

## 2015-02-12 NOTE — Assessment & Plan Note (Signed)
Patient has nodularity of achilles tendon.  He may have partial tear of achilles tendon.  Refer to [facility] for further evaluation and treatment.

## 2015-02-12 NOTE — Patient Instructions (Signed)
Please complete the following lab tests before your next follow up appointment: BMET, A1c, microalb/cr ratio - 250.00 FLP, LFTs - 272.4

## 2015-02-12 NOTE — Progress Notes (Signed)
Pre visit review using our clinic review tool, if applicable. No additional management support is needed unless otherwise documented below in the visit note. 
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
## 2015-02-21 DIAGNOSIS — M25571 Pain in right ankle and joints of right foot: Secondary | ICD-10-CM | POA: Diagnosis not present

## 2015-02-21 DIAGNOSIS — M6701 Short Achilles tendon (acquired), right ankle: Secondary | ICD-10-CM | POA: Diagnosis not present

## 2015-02-21 DIAGNOSIS — M7661 Achilles tendinitis, right leg: Secondary | ICD-10-CM | POA: Diagnosis not present

## 2015-02-21 DIAGNOSIS — M67971 Unspecified disorder of synovium and tendon, right ankle and foot: Secondary | ICD-10-CM | POA: Diagnosis not present

## 2015-03-07 DIAGNOSIS — M67971 Unspecified disorder of synovium and tendon, right ankle and foot: Secondary | ICD-10-CM | POA: Diagnosis not present

## 2015-03-18 DIAGNOSIS — M67971 Unspecified disorder of synovium and tendon, right ankle and foot: Secondary | ICD-10-CM | POA: Diagnosis not present

## 2015-08-13 ENCOUNTER — Ambulatory Visit (INDEPENDENT_AMBULATORY_CARE_PROVIDER_SITE_OTHER): Payer: Medicare Other | Admitting: Internal Medicine

## 2015-08-13 ENCOUNTER — Encounter: Payer: Self-pay | Admitting: Internal Medicine

## 2015-08-13 ENCOUNTER — Ambulatory Visit (INDEPENDENT_AMBULATORY_CARE_PROVIDER_SITE_OTHER)
Admission: RE | Admit: 2015-08-13 | Discharge: 2015-08-13 | Disposition: A | Discharge status: Home / Self Care | Payer: Medicare Other | Source: Ambulatory Visit | Attending: Internal Medicine | Admitting: Internal Medicine

## 2015-08-13 VITALS — BP 150/90 | HR 79 | Temp 98.2°F | Wt 273.0 lb

## 2015-08-13 DIAGNOSIS — M25562 Pain in left knee: Secondary | ICD-10-CM | POA: Diagnosis not present

## 2015-08-13 DIAGNOSIS — M25571 Pain in right ankle and joints of right foot: Secondary | ICD-10-CM

## 2015-08-13 DIAGNOSIS — M19071 Primary osteoarthritis, right ankle and foot: Secondary | ICD-10-CM | POA: Diagnosis not present

## 2015-08-13 DIAGNOSIS — M179 Osteoarthritis of knee, unspecified: Secondary | ICD-10-CM | POA: Diagnosis not present

## 2015-08-13 NOTE — Patient Instructions (Addendum)
Please proceed to [place] location for x ray of left knee and right ankle Our office will contact you regarding referral to orthopedic specialist for chronic left knee pain Please complete the following lab tests before your next follow up appointment: BMET, A1c - 250.00 FLP, LFTs, TSH - 272.4

## 2015-08-13 NOTE — Progress Notes (Signed)
Pre visit review using our clinic review tool, if applicable. No additional management support is needed unless otherwise documented below in the visit note. 
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
## 2016-02-18 ENCOUNTER — Ambulatory Visit (INDEPENDENT_AMBULATORY_CARE_PROVIDER_SITE_OTHER): Payer: Medicare Other | Admitting: Family Medicine

## 2016-02-18 ENCOUNTER — Encounter: Payer: Self-pay | Admitting: Family Medicine

## 2016-02-18 ENCOUNTER — Ambulatory Visit (INDEPENDENT_AMBULATORY_CARE_PROVIDER_SITE_OTHER)
Admission: RE | Admit: 2016-02-18 | Discharge: 2016-02-18 | Disposition: A | Discharge status: Home / Self Care | Payer: Medicare Other | Source: Ambulatory Visit | Attending: Family Medicine | Admitting: Family Medicine

## 2016-02-18 VITALS — BP 140/80 | HR 73 | Temp 98.4°F

## 2016-02-18 DIAGNOSIS — M545 Low back pain, unspecified: Secondary | ICD-10-CM

## 2016-02-18 DIAGNOSIS — R809 Proteinuria, unspecified: Secondary | ICD-10-CM | POA: Diagnosis not present

## 2016-02-18 DIAGNOSIS — M47816 Spondylosis without myelopathy or radiculopathy, lumbar region: Secondary | ICD-10-CM | POA: Diagnosis not present

## 2016-02-18 DIAGNOSIS — Z87442 Personal history of urinary calculi: Secondary | ICD-10-CM | POA: Insufficient documentation

## 2016-02-18 LAB — POCT URINALYSIS DIPSTICK
Bilirubin, UA: NEGATIVE
Blood, UA: NEGATIVE
Glucose, UA: NEGATIVE
Ketones, UA: NEGATIVE
Leukocytes, UA: NEGATIVE
Nitrite, UA: NEGATIVE
Protein, UA: POSITIVE
Urobilinogen, UA: 0.2
pH, UA: 6

## 2016-02-18 LAB — MICROALBUMIN / CREATININE URINE RATIO
Creatinine,U: 214 mg/dL
Microalb Creat Ratio: 1.8 mg/g (ref 0.0–30.0)
Microalb, Ur: 3.8 mg/dL — ABNORMAL HIGH (ref 0.0–1.9)

## 2016-02-18 LAB — BASIC METABOLIC PANEL
BUN: 12 mg/dL (ref 6–23)
CO2: 32 mEq/L (ref 19–32)
Calcium: 9 mg/dL (ref 8.4–10.5)
Chloride: 104 mEq/L (ref 96–112)
Creatinine, Ser: 0.83 mg/dL (ref 0.40–1.50)
GFR: 118.81 mL/min (ref >60.00)
Glucose, Bld: 83 mg/dL (ref 70–99)
Potassium: 4.4 mEq/L (ref 3.5–5.1)
Sodium: 141 mEq/L (ref 135–145)

## 2016-02-18 NOTE — Patient Instructions (Addendum)
A few things to remember from today's visit:   Left-sided low back pain without sciatica, unspecified chronicity - Plan: DG Lumbar Spine Complete, POC Urinalysis Dipstick  History of nephrolithiasis - Plan: POC Urinalysis Dipstick, Basic Metabolic Panel  Protein in urine - Plan: Microalbumin/Creatinine Ratio, Urine, Basic Metabolic Panel  Back pain seems to be muscular. Topical over-the-counter icy hot might help, local massage. Continue following with pain clinic at the [place].  If numbness on genital area, unusual weakness, stool incontinence, fever, or redness on area seek immediate medical attention.  Schedule appointment to follow on your chronic medical problems.  Please be sure medication list is accurate. If a new problem present, please set up appointment sooner than planned today.

## 2016-02-18 NOTE — Progress Notes (Signed)
Pre visit review using our clinic review tool, if applicable. No additional management support is needed unless otherwise documented below in the visit note. 
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
## 2016-02-20 ENCOUNTER — Other Ambulatory Visit: Payer: Self-pay

## 2016-02-20 MED ORDER — TIZANIDINE HCL 4 MG PO TABS: 4.0000 mg | ORAL_TABLET | Freq: Three times a day (TID) | ORAL | 0 refills | Status: DC

## 2016-03-18 ENCOUNTER — Ambulatory Visit: Payer: Medicare Other | Admitting: Family Medicine

## 2016-04-29 ENCOUNTER — Other Ambulatory Visit: Payer: Self-pay | Admitting: Family Medicine

## 2016-04-29 NOTE — Telephone Encounter (Signed)
Is this okay to refill? 

## 2016-05-03 NOTE — Telephone Encounter (Signed)
Rx sent. He needs follow up if he needs further refills. Thanks, BJ

## 2016-08-03 ENCOUNTER — Encounter: Payer: Self-pay | Admitting: Family Medicine

## 2016-08-03 ENCOUNTER — Ambulatory Visit (INDEPENDENT_AMBULATORY_CARE_PROVIDER_SITE_OTHER): Payer: Medicare Other | Admitting: Family Medicine

## 2016-08-03 VITALS — BP 140/70 | HR 96 | Resp 16 | Ht 74.0 in | Wt 321.2 lb

## 2016-08-03 DIAGNOSIS — L304 Erythema intertrigo: Secondary | ICD-10-CM | POA: Diagnosis not present

## 2016-08-03 DIAGNOSIS — L03311 Cellulitis of abdominal wall: Secondary | ICD-10-CM

## 2016-08-03 MED ORDER — NYSTATIN 100000 UNIT/GM EX CREA: 1.0000 "application " | TOPICAL_CREAM | Freq: Two times a day (BID) | CUTANEOUS | 1 refills | Status: AC

## 2016-08-03 MED ORDER — CEPHALEXIN 500 MG PO CAPS: 500.0000 mg | ORAL_CAPSULE | Freq: Three times a day (TID) | ORAL | 0 refills | Status: AC

## 2016-08-03 NOTE — Patient Instructions (Signed)
  Christopher Riley I have seen you today for an acute visit.  1. Intertrigo  Desitin over the counter once daily. Antibacterial soap. Good controlled sugar,.    - nystatin cream (MYCOSTATIN); Apply 1 application topically 2 (two) times daily.  Dispense: 45 g; Refill: 1  2. Cellulitis of right abdominal wall  - cephALEXin (KEFLEX) 500 MG capsule; Take 1 capsule (500 mg total) by mouth 3 (three) times daily.  Dispense: 21 capsule; Refill: 0    In general please monitor for signs of worsening symptoms and seek immediate medical attention if any concerning/warning symptom.  Because diabetes you need to follow with PCP.  For testosterone prescription you need to see your urologists.    Please be sure you have an appointment already scheduled to follow on your chronic medical problems, either through [place] or here.

## 2016-08-03 NOTE — Progress Notes (Signed)
HPI:  ACUTE VISIT:  Chief Complaint  Patient presents with  . Rash    Christopher Riley is a 66 y.o. male, who is here today complaining of "a few months" of sin rash on lower abdomen.   He denies any new medication, detergent, soap, or body product. No known insect bite or outdoor exposures to plants. No sick contact. No Hx of eczema or similar rash in the past.  OTC medication for this problem: Yes, does not recall name of ointment he has used. Rash is getting worse, mildly pruritic but now tender (burining).  He denies associated oral lesions/edema,cough, wheezing, dyspnea, abdominal pain, nausea, or vomiting.  Hx of DM II and HTN.  Lab Results  Component Value Date   CREATININE 0.83 02/18/2016   BUN 12 02/18/2016   NA 141 02/18/2016   K 4.4 02/18/2016   CL 104 02/18/2016   CO2 32 02/18/2016   -He is also requesting Rx for testosterone. According to pt, it has been prescribed in the past by Dr Shawna Orleans. [facility].  I saw him back in 01/2016 also acute visit, lower back pain. He follows with ortho at the [place].   Review of Systems  Constitutional: Negative for appetite change, fatigue and fever.  HENT: Negative for mouth sores and sore throat.   Respiratory: Negative for cough, shortness of breath and wheezing.   Gastrointestinal: Negative for abdominal pain, nausea and vomiting.       No changes in bowel habits.  Genitourinary: Negative for decreased urine volume and hematuria.  Musculoskeletal: Positive for arthralgias, back pain (chronic) and gait problem.       Hx of OA.  Skin: Positive for rash. Negative for wound.  Hematological: Negative for adenopathy. Does not bruise/bleed easily.  Psychiatric/Behavioral: Negative for confusion.      Current Outpatient Prescriptions on File Prior to Visit  Medication Sig Dispense Refill  . ALBUTEROL IN Inhale 2 puffs into the lungs 4 (four) times  daily as needed.     Marland Kitchen amoxicillin (AMOXIL) 500 MG capsule Take 500 mg by mouth as directed.     Marland Kitchen atenolol (TENORMIN) 25 MG tablet Take 25 mg by mouth daily.    Marland Kitchen atorvastatin (LIPITOR) 80 MG tablet Take 40 mg by mouth every evening.     . brimonidine (ALPHAGAN) 0.2 % ophthalmic solution Place 1 drop into both eyes at bedtime.    Marland Kitchen buPROPion (WELLBUTRIN) 100 MG tablet Take 100 mg by mouth 2 (two) times daily.     Marland Kitchen docusate sodium (COLACE) 100 MG capsule Take 100 mg by mouth 2 (two) times daily.    Marland Kitchen etodolac (LODINE) 400 MG tablet Take 400 mg by mouth daily.    . furosemide (LASIX) 20 MG tablet Take 20 mg by mouth daily.    Marland Kitchen HYDROcodone-acetaminophen (NORCO/VICODIN) 5-325 MG per tablet Take 1 tablet by mouth every 6 (six) hours as needed for moderate pain.    Marland Kitchen latanoprost (XALATAN) 0.005 % ophthalmic solution Place 1 drop into both eyes at bedtime.    . Menthol-Methyl Salicylate (THERA-GESIC) 1-15 % CREA Apply topically 3 (three) times daily.    . metFORMIN (GLUCOPHAGE) 1000 MG tablet Take 1 tablet (1,000 mg total) by mouth 2 (two) times daily with a meal.    . methadone (DOLOPHINE) 10 MG tablet Take 10 mg by mouth 2 (two) times daily.    Marland Kitchen morphine (MSIR) 30  MG tablet Take 30 mg by mouth as needed for severe pain.    Marland Kitchen omeprazole (PRILOSEC) 20 MG capsule Take 20 mg by mouth daily.    . psyllium (METAMUCIL) 58.6 % powder Take by mouth daily.    Marland Kitchen tiZANidine (ZANAFLEX) 4 MG tablet TAKE ONE TABLET BY MOUTH THREE TIMES DAILY 30 tablet 0  . traZODone (DESYREL) 50 MG tablet Take 400 mg by mouth at bedtime. Patient stated that he is to take 8 tablets at bedtime     Current Facility-Administered Medications on File Prior to Visit  Medication Dose Route Frequency Provider Last Rate Last Dose  . chlorhexidine (HIBICLENS) 4 % liquid   Topical UD Lowella Bandy, MD         Past Medical History:  Diagnosis Date  . Arthritis    KNEES AND ANKLES  . BPH (benign prostatic hypertrophy)   . Chronic pain     back, knees  . Crutches as ambulation aid   . Depression   . Diabetic peripheral neuropathy ([place])   . Erectile dysfunction   . GERD (gastroesophageal reflux disease)   . Glaucoma    BILATERAL  . H/O agent Orange exposure   . History of adenomatous polyp of colon    2008  &  2010      . History of gastric ulcer   . History of lower GI bleeding    2011--  POST COLONOSCOPY W/ POLYPECTOMY/    AND SEVERAL TIMES SINCE SECONDARY TO HEMORRHOIDS  . History of prostate cancer    2009  ---S/P  EXTERNAL RADIATION THERAPY  . History of seizure    1980'S--  SECONDARY TO MEDICATION--  NO ISSUE SINCE  . Hyperlipidemia   . Hypertension   . Impaired hearing    BILATERAL  . Mild asthma   . Nocturia   . OSA (obstructive sleep apnea)    NON-COMPLIANT CPAP--  SLEEP STUDY 2013  at  [facility] in [place] traumatic stress disorder (PTSD)    HISTORY SUIDICE ATTEMPT AFTER [place]  . Scrotal edema   . Type 2 diabetes mellitus ([place])   . Urinary incontinence   . Vision loss of right eye   . Wears glasses    No Known Allergies  Social History   Social History  . Marital status: Married    Spouse name: N/A  . Number of children: 4  . Years of education: N/A   Occupational History  . Retired Unemployed    Former [employer]   Social History Main Topics  . Smoking status: Never Smoker  . Smokeless tobacco: Never Used  . Alcohol use No     Comment: none since 1988  . Drug use:      Comment: speed in [place] but never since  . Sexual activity: Not Asked   Other Topics Concern  . None   Social History Narrative   Retired    Married    Never Smoked   Alcohol use-no     Drug use-no            Daily Caffeine Use-1 cup daily    Vitals:   08/03/16 1535  BP: 140/70  Pulse: 96  Resp: 16   Body mass index is 41.25 kg/m.     Physical Exam  Nursing note and vitals reviewed. Constitutional: He is oriented to person, place, and time. He appears well-developed. No distress.  HENT:    Head: Atraumatic.  Mouth/Throat: Oropharynx is clear and moist  and mucous membranes are normal.  Eyes: Conjunctivae are normal.  Cardiovascular: Normal rate and regular rhythm.   Respiratory: Effort normal and breath sounds normal. No respiratory distress.  GI: Soft. There is no tenderness. A hernia (Umbilical, reducible, not tender) is present.  Lymphadenopathy:       Right: No inguinal adenopathy present.  Neurological: He is alert and oriented to person, place, and time.  Gait assisted with crutches.  Skin: Skin is warm. Rash noted. There is erythema.     Macular erythema and local heat right lower abdomen in skin crease. No induration.  Psychiatric: He has a normal mood and affect.  Well groomed, good eye contact.      ASSESSMENT AND PLAN:     Christopher Riley was seen today for rash.  Diagnoses and all orders for this visit:   Intertrigo  Try to keep area dry and clean. Explained that is can become chronic/intermittent. Desitin OTC at bedtime may also help. Avoid ointments. Nystatin cream recommended. F/U as needed.   -     nystatin cream (MYCOSTATIN); Apply 1 application topically 2 (two) times daily.   Cellulitis of right abdominal wall  Seems like intertrigo area may have a mild underlying bacterial infection, no signs of abscess. Antibacterial soap and Cephalexin recommended. Clearly instructed about warning signs. F/U in 1-2 weeks with PCP, before if needed.    -     cephALEXin (KEFLEX) 500 MG capsule; Take 1 capsule (500 mg total) by mouth 3 (three) times daily.   In regard to testosterone request, I encouraged him to follow with his urologists at the [place]. I do not see medication in his medication list, I do not see Dx of hypogonadism on his problem list. I explained testosterone side effects, he is also on a few controlled medications.      Return in about 10 days (around 08/13/2016) for with PCP.     -Christopher Riley was advised to return or  notify a doctor immediately if symptoms worsen or new concerns arise.       Betty G. Martinique, MD  [facility]. [place] office.

## 2016-08-03 NOTE — Progress Notes (Signed)
Pre visit review using our clinic review tool, if applicable. No additional management support is needed unless otherwise documented below in the visit note. 

## 2016-08-11 ENCOUNTER — Other Ambulatory Visit: Payer: Self-pay | Admitting: Family Medicine

## 2016-08-11 DIAGNOSIS — L03311 Cellulitis of abdominal wall: Secondary | ICD-10-CM

## 2016-08-13 NOTE — Telephone Encounter (Signed)
Pt request refill   Cephalexin 500 mg Oral 3 times daily  Pt thinks he needs another round of abx to get better.  [facility]/ [address]

## 2016-08-16 NOTE — Telephone Encounter (Signed)
Treatment was appropriate for mild cellulitis, erythema may still be present because intertrigo. He was recommended to follow with PCP.  He has DM II and other medical problems, I do not see recent f/u on these problems.  Thanks, BJ

## 2016-08-25 ENCOUNTER — Ambulatory Visit: Payer: Medicare Other | Admitting: Family Medicine

## 2016-11-12 ENCOUNTER — Ambulatory Visit: Payer: Non-veteran care | Admitting: Podiatry

## 2016-11-24 ENCOUNTER — Ambulatory Visit (INDEPENDENT_AMBULATORY_CARE_PROVIDER_SITE_OTHER): Payer: Non-veteran care | Admitting: Podiatry

## 2016-11-24 ENCOUNTER — Encounter: Payer: Self-pay | Admitting: Podiatry

## 2016-11-24 VITALS — BP 141/81 | HR 72

## 2016-11-24 DIAGNOSIS — E1142 Type 2 diabetes mellitus with diabetic polyneuropathy: Secondary | ICD-10-CM | POA: Diagnosis not present

## 2016-11-24 DIAGNOSIS — Q828 Other specified congenital malformations of skin: Secondary | ICD-10-CM | POA: Diagnosis not present

## 2016-11-24 DIAGNOSIS — B351 Tinea unguium: Secondary | ICD-10-CM | POA: Diagnosis not present

## 2016-11-24 DIAGNOSIS — M79676 Pain in unspecified toe(s): Secondary | ICD-10-CM

## 2016-11-24 NOTE — Progress Notes (Signed)
   Subjective:    Patient ID: Christopher Riley, male    DOB: 15-May-1951, 66 y.o.   MRN: [id number]  HPI this patient presents the office with chief complaint of a painful callus on the outside ball of his left foot. He says that he frequently gets a painful callus and he treats it with acid at home. He says that he  applied the acid at home , but the callus pain remains. this area  Is  painful walking and wearing his shoes. He also has thick disfigured discolored nails, which are painful walking and wearing his shoes. This patient is a type II diabetic with neuropathy. He presents the office today for an evaluation and treatment of his painful feet    Review of Systems  Constitutional: Positive for fatigue.  HENT: Positive for hearing loss.   Respiratory: Positive for chest tightness.   Cardiovascular: Positive for leg swelling.  Gastrointestinal: Positive for blood in stool.  Genitourinary: Positive for urgency.       Increased urination  Musculoskeletal: Positive for back pain and gait problem.       Joint pain  Neurological: Positive for numbness.  Hematological:       Slow to heal  Psychiatric/Behavioral: Positive for confusion.       Objective:   Physical Exam GENERAL APPEARANCE: Alert, conversant. Appropriately groomed. No acute distress.  VASCULAR: Pedal pulses are  palpable at  [facility] and PT bilateral.  Capillary refill time is immediate to all digits,  Normal temperature gradient.   NEUROLOGIC: sensation is absent to 5.07 monofilament at 5/5 sites bilateral.  Light touch is intact bilateral, Muscle strength normal.  MUSCULOSKELETAL: acceptable muscle strength, tone and stability bilateral.  Intrinsic muscluature intact bilateral.  Rectus appearance of foot and digits noted bilateral.   DERMATOLOGIC: skin color, texture, and turgor are within normal limits.  No preulcerative lesions or ulcers  are seen, no interdigital maceration noted.  No open lesions present.   No drainage noted.  Porokeratosis  Sub 5th metatarsal covered with white necrotic tissue. NAILS  thick disfigured discolored nails 10 both feet        Assessment & Plan:  Onychomycosis  B/L   Porokeratosis left foot.  IE  Debridement of nails.  Debridement of callus.  Discussed with this patient the fact that he has absent LOPS.  I recommended that he receive a pair of diabetic shoes to be worn to protect his feet. Therefore, Christopher Riley  was contacted and she talked to this patient. She found out that he could receive 2 pairs of shoes through the [place] and therefore recommended that he receive his shoes through the [place]. Iif there is a problem receiving the shoes, then he should return to this office and we will order him a pairRTC 3 months for nail care.  RTC 3 months.   Christopher Riley DPM

## 2017-02-23 ENCOUNTER — Ambulatory Visit: Payer: Non-veteran care | Admitting: Podiatry

## 2017-05-03 ENCOUNTER — Ambulatory Visit: Payer: Non-veteran care | Admitting: Podiatry

## 2017-05-13 ENCOUNTER — Ambulatory Visit (INDEPENDENT_AMBULATORY_CARE_PROVIDER_SITE_OTHER): Payer: Non-veteran care | Admitting: Podiatry

## 2017-05-13 ENCOUNTER — Encounter: Payer: Self-pay | Admitting: Podiatry

## 2017-05-13 DIAGNOSIS — Q828 Other specified congenital malformations of skin: Secondary | ICD-10-CM

## 2017-05-13 DIAGNOSIS — E1142 Type 2 diabetes mellitus with diabetic polyneuropathy: Secondary | ICD-10-CM | POA: Diagnosis not present

## 2017-05-16 NOTE — Progress Notes (Signed)
   Subjective:    Patient ID: Christopher Riley, male    DOB: 04/15/1951, 66 y.o.   MRN: [id number]  HPI this patient presents the office with chief complaint of a painful callus on the outside ball of his both feet.  He says he had his nails done 2 weeks ago in a salon. He also has thick disfigured discolored nails, which are painful walking and wearing his shoes. This patient is a type II diabetic with neuropathy. He presents the office today for an evaluation and treatment of his painful feet    Review of Systems  Constitutional: Positive for fatigue.  HENT: Positive for hearing loss.   Respiratory: Positive for chest tightness.   Cardiovascular: Positive for leg swelling.  Gastrointestinal: Positive for blood in stool.  Genitourinary: Positive for urgency.       Increased urination  Musculoskeletal: Positive for back pain and gait problem.       Joint pain  Neurological: Positive for numbness.  Hematological:       Slow to heal  Psychiatric/Behavioral: Positive for confusion.       Objective:   Physical Exam GENERAL APPEARANCE: Alert, conversant. Appropriately groomed. No acute distress.  VASCULAR: Pedal pulses are  palpable at  [facility] and PT bilateral.  Capillary refill time is immediate to all digits,  Normal temperature gradient.   NEUROLOGIC: sensation is absent to 5.07 monofilament at 5/5 sites bilateral.  Light touch is intact bilateral, Muscle strength normal.  MUSCULOSKELETAL: acceptable muscle strength, tone and stability bilateral.  Intrinsic muscluature intact bilateral.  Rectus appearance of foot and digits noted bilateral.   DERMATOLOGIC: skin color, texture, and turgor are within normal limits.  No preulcerative lesions or ulcers  are seen, no interdigital maceration noted.  No open lesions present.   No drainage noted. Porokeratosis  Sub 5th metatarsal  B/L. NAILS  thick disfigured discolored nails 10 both feet        Assessment & Plan:  Porokeratosis  Sub 5th  metatarsal  B/L  .  Debridement of callus using a # 15 blade.Marland Kitchen  RTC 3 months for nail care and callus care.   [name] Barefoot DPM

## 2017-07-13 ENCOUNTER — Ambulatory Visit (INDEPENDENT_AMBULATORY_CARE_PROVIDER_SITE_OTHER): Payer: Non-veteran care | Admitting: Podiatry

## 2017-07-13 ENCOUNTER — Encounter: Payer: Self-pay | Admitting: Podiatry

## 2017-07-13 DIAGNOSIS — E1142 Type 2 diabetes mellitus with diabetic polyneuropathy: Secondary | ICD-10-CM

## 2017-07-13 DIAGNOSIS — B351 Tinea unguium: Secondary | ICD-10-CM

## 2017-07-13 DIAGNOSIS — M79676 Pain in unspecified toe(s): Secondary | ICD-10-CM

## 2017-07-13 DIAGNOSIS — Q828 Other specified congenital malformations of skin: Secondary | ICD-10-CM

## 2017-07-13 NOTE — Progress Notes (Signed)
Complaint:  Visit Type: Patient returns to my office for continued preventative foot care services. Complaint: Patient states" my nails have grown long and thick and become painful to walk and wear shoes" Patient has been diagnosed with DM with neuropathy.  Patient has painful callus under the outside left forefoot.. The patient presents for preventative foot care services. No changes to ROS  Podiatric Exam: Vascular: dorsalis pedis and posterior tibial pulses are palpable bilateral. Capillary return is immediate. Temperature gradient is WNL. Skin turgor WNL  Sensorium: Absent  Semmes Weinstein monofilament test. Normal tactile sensation bilaterally. Nail Exam: Pt has thick disfigured discolored nails with subungual debris noted bilateral entire nail hallux through fifth toenails Ulcer Exam: There is no evidence of ulcer or pre-ulcerative changes or infection. Orthopedic Exam:  . Acceptable muscle strength, tone and stability bilateral. He.  Intrinsic musculature intact bilaterally.  Rectus appearance of foot and digits bilaterally. SKIN  Porokeratosis  Sub 5th left foot.  Diagnosis:  Onychomycosis, , Pain in right toe, pain in left toes  Porokeratosis sub 5th left    Treatment & Plan Procedures and Treatment: Consent by patient was obtained for treatment procedures.   Debridement of mycotic and hypertrophic toenails, 1 through 5 bilateral and clearing of subungual debris. No ulceration, no infection noted. Debridement of porokeratosis  Left foot utilizing a #15 blade. Return Visit-Office Procedure: Patient instructed to return to the office for a follow up visit 10 weeks for continued evaluation and treatment.    [name] Barefoot DPM

## 2017-07-14 DIAGNOSIS — K625 Hemorrhage of anus and rectum: Secondary | ICD-10-CM | POA: Diagnosis not present

## 2017-07-14 DIAGNOSIS — Z8719 Personal history of other diseases of the digestive system: Secondary | ICD-10-CM | POA: Diagnosis not present

## 2017-07-29 ENCOUNTER — Encounter: Payer: Self-pay | Admitting: Podiatry

## 2017-07-29 ENCOUNTER — Ambulatory Visit (INDEPENDENT_AMBULATORY_CARE_PROVIDER_SITE_OTHER): Payer: Medicare Other | Admitting: Podiatry

## 2017-07-29 DIAGNOSIS — Q828 Other specified congenital malformations of skin: Secondary | ICD-10-CM | POA: Diagnosis not present

## 2017-07-29 DIAGNOSIS — E1142 Type 2 diabetes mellitus with diabetic polyneuropathy: Secondary | ICD-10-CM | POA: Diagnosis not present

## 2017-07-30 NOTE — Progress Notes (Signed)
This patient returns to the office for continued pain noted in the outside ball of his left foot at the site of the callus.  He was seen his office approximately 2-3 weeks  ago and had debridement performed on his porokeratosis left foot.he says that this site is now giving him the fit and he desires to be reevaluated and treated.  General Appearance  Alert, conversant and in no acute stress.  Vascular  Dorsalis pedis and posterior pulses are palpable  bilaterally.  Capillary return is within normal limits  bilaterally. Temperature is within normal limits  Bilaterally.  Neurologic  Senn-Weinstein monofilament wire test absent   bilaterally. Muscle power within normal limits bilaterally.  Nails Thick disfigured discolored nails with subungual debris bilaterally from hallux to fifth toes bilaterally. No evidence of bacterial infection or drainage bilaterally.  Orthopedic  No limitations of motion of motion feet bilaterally.  No crepitus or effusions noted.  No bony pathology or digital deformities noted. Plantarflexed fifth metatarsal left foot.  Skin  normotropic skin with porokeratosis noted sub 5th metatarsal left foot.  No signs of infections or ulcers noted.Inflamed porokeratosis  Left foot  Capsulitis 5th met left foot.  Porokeratosis sub 5th met left foot  Capsulitis sub 5th metatarsal left foot.   ROV.  Debridement of porokeratosis left.  Injection therapy sub 5th met left foot.  Injection therapy using 1.0 cc. Of 2% xylocaine( 20 mg.) plus 1 cc. of kenalog-la ( 10 mg) plus 1/2 cc. of dexamethazone phosphate ( 2 mg).  RTC previously scheduled appointment.   [name] Barefoot DPM

## 2017-09-05 ENCOUNTER — Ambulatory Visit: Payer: Non-veteran care | Admitting: Podiatry

## 2017-09-06 ENCOUNTER — Encounter: Payer: Self-pay | Admitting: Podiatry

## 2017-09-06 ENCOUNTER — Ambulatory Visit (INDEPENDENT_AMBULATORY_CARE_PROVIDER_SITE_OTHER): Payer: Medicare Other | Admitting: Podiatry

## 2017-09-06 DIAGNOSIS — B351 Tinea unguium: Secondary | ICD-10-CM | POA: Diagnosis not present

## 2017-09-06 DIAGNOSIS — Q828 Other specified congenital malformations of skin: Secondary | ICD-10-CM

## 2017-09-06 DIAGNOSIS — M79676 Pain in unspecified toe(s): Secondary | ICD-10-CM | POA: Diagnosis not present

## 2017-09-06 DIAGNOSIS — E1142 Type 2 diabetes mellitus with diabetic polyneuropathy: Secondary | ICD-10-CM

## 2017-09-06 NOTE — Progress Notes (Signed)
Complaint:  Visit Type: Patient returns to my office for continued preventative foot care services. Complaint: Patient states" my nails have grown long and thick and become painful to walk and wear shoes" Patient has been diagnosed with DM with neuropathy.  Patient has painful callus under the outside left forefoot.Marland Kitchen He was treated at the last visit with injection therapy to help to relieve the pain under the outside ball of his left foot.  He says that the shot lasted for 2-1/2 weeks, but it has returned.  He says he requests  another injection in his left foot to help to relieve his pain.    Podiatric Exam: Vascular: dorsalis pedis and posterior tibial pulses are palpable bilateral. Capillary return is immediate. Temperature gradient is WNL. Skin turgor WNL  Sensorium: Absent  Semmes Weinstein monofilament test. Normal tactile sensation bilaterally. Nail Exam: Pt has thick disfigured discolored nails with subungual debris noted bilateral entire nail hallux through fifth toenails Ulcer Exam: There is no evidence of ulcer or pre-ulcerative changes or infection. Orthopedic Exam:  . Acceptable muscle strength, tone and stability bilateral. He.  Intrinsic musculature intact bilaterally.  Plantar flexed fifth metatarsal left foot.   I refused to provide him with a second cortisone injection since the problem is related to the bone of the fifth metatarsal left and not truly due to the porokeratoti. SKIN  Porokeratosis  Sub 5th left foot.  Diagnosis:  Onychomycosis, , Pain in right toe, pain in left toes  Porokeratosis sub 5th left    Treatment & Plan Procedures and Treatment: Consent by patient was obtained for treatment procedures.   Debridement of mycotic and hypertrophic toenails, 1 through 5 bilateral and clearing of subungual debris. No ulceration, no infection noted. Debridement of porokeratosis  Left foot utilizing a #15 blade.I refused to provide him with a second cortisone injection since the  problem is related to the bone of the fifth metatarsal left foot and not do to the poor keratoses sub-fifth left foot. I contacted Liliane Channel  and he told the patient to get a note from the podiatrist at the [facility] and take a note to [employer]  to obtain new diabetic insoles to place in his diabetic shoes.   Return Visit-Office Procedure: Patient instructed to return to the office for a follow up visit 10 weeks for continued evaluation and treatment.    [name] Barefoot DPM

## 2017-09-08 ENCOUNTER — Encounter: Payer: Self-pay | Admitting: Podiatry

## 2017-09-14 ENCOUNTER — Ambulatory Visit: Payer: Non-veteran care | Admitting: Podiatry

## 2017-12-02 ENCOUNTER — Ambulatory Visit (INDEPENDENT_AMBULATORY_CARE_PROVIDER_SITE_OTHER): Payer: Medicare Other | Admitting: Podiatry

## 2017-12-02 ENCOUNTER — Encounter: Payer: Self-pay | Admitting: Podiatry

## 2017-12-02 DIAGNOSIS — E1142 Type 2 diabetes mellitus with diabetic polyneuropathy: Secondary | ICD-10-CM | POA: Diagnosis not present

## 2017-12-02 DIAGNOSIS — Q828 Other specified congenital malformations of skin: Secondary | ICD-10-CM | POA: Diagnosis not present

## 2017-12-02 DIAGNOSIS — M722 Plantar fascial fibromatosis: Secondary | ICD-10-CM

## 2017-12-02 NOTE — Progress Notes (Signed)
This patient returns to the office for continued pain noted in the outside ball of his left foot at the site of the callus. He  had debridement performed on his porokeratosis left foot.  He says that this site is now giving him the fit and he desires to be reevaluated and treated.  General Appearance  Alert, conversant and in no acute stress.  Vascular  Dorsalis pedis and posterior pulses are palpable  bilaterally.  Capillary return is within normal limits  bilaterally. Temperature is within normal limits  Bilaterally.  Neurologic  Senn-Weinstein monofilament wire test absent   bilaterally. Muscle power within normal limits bilaterally.  Nails Thick disfigured discolored nails with subungual debris bilaterally from hallux to fifth toes bilaterally. No evidence of bacterial infection or drainage bilaterally.  Orthopedic  No limitations of motion of motion feet bilaterally.  No crepitus or effusions noted.  No bony pathology or digital deformities noted. Plantarflexed fifth metatarsal left foot.  Skin  normotropic skin with porokeratosis noted sub 5th metatarsal left foot.  No signs of infections or ulcers noted.Inflamed porokeratosis  Left foot  Capsulitis 5th met left foot.  Porokeratosis sub 5th met left foot  Capsulitis sub 5th metatarsal left foot.   ROV.  Debridement of porokeratosis left.  Injection therapy sub 5th met left foot.  Injection therapy using 1.0 cc. Of 2% xylocaine( 20 mg.) plus 1 cc. of kenalog-la ( 10 mg) plus 1/2 cc. of dexamethazone phosphate ( 2 mg).  RTC 10 weeks.   [name] Barefoot DPM

## 2018-01-03 ENCOUNTER — Ambulatory Visit (INDEPENDENT_AMBULATORY_CARE_PROVIDER_SITE_OTHER): Payer: Medicare Other | Admitting: Podiatry

## 2018-01-03 ENCOUNTER — Encounter: Payer: Self-pay | Admitting: Podiatry

## 2018-01-03 ENCOUNTER — Encounter

## 2018-01-03 DIAGNOSIS — B351 Tinea unguium: Secondary | ICD-10-CM

## 2018-01-03 DIAGNOSIS — E1142 Type 2 diabetes mellitus with diabetic polyneuropathy: Secondary | ICD-10-CM | POA: Diagnosis not present

## 2018-01-03 DIAGNOSIS — M79676 Pain in unspecified toe(s): Secondary | ICD-10-CM

## 2018-01-03 DIAGNOSIS — Q828 Other specified congenital malformations of skin: Secondary | ICD-10-CM

## 2018-01-03 NOTE — Progress Notes (Signed)
Complaint:  Visit Type: Patient returns to my office for continued preventative foot care services. Complaint: Patient states" my nails have grown long and thick and become painful to walk and wear shoes" Patient has been diagnosed with DM with neuropathy.  Patient has painful callus under the outside left forefoot.. The patient presents for preventative foot care services. No changes to ROS  Podiatric Exam: Vascular: dorsalis pedis and posterior tibial pulses are palpable bilateral. Capillary return is immediate. Temperature gradient is WNL. Skin turgor WNL  Sensorium: Absent  Semmes Weinstein monofilament test. Normal tactile sensation bilaterally. Nail Exam: Pt has thick disfigured discolored nails with subungual debris noted bilateral entire nail hallux through fifth toenails Ulcer Exam: There is no evidence of ulcer or pre-ulcerative changes or infection. Orthopedic Exam:  . Acceptable muscle strength, tone and stability bilateral. He.  Intrinsic musculature intact bilaterally.  Rectus appearance of foot and digits bilaterally. SKIN  Porokeratosis  Sub 5th left foot.  Diagnosis:  Onychomycosis, , Pain in right toe, pain in left toes  Porokeratosis sub 5th left    Treatment & Plan Procedures and Treatment: Consent by patient was obtained for treatment procedures.   Debridement of mycotic and hypertrophic toenails, 1 through 5 bilateral and clearing of subungual debris. No ulceration, no infection noted. Debridement of porokeratosis  Left foot utilizing a #15 blade. Return Visit-Office Procedure: Patient instructed to return to the office for a follow up visit 9 weeks for continued evaluation and treatment.    [name] Barefoot DPM

## 2018-01-31 ENCOUNTER — Ambulatory Visit: Payer: Non-veteran care | Admitting: Podiatry

## 2018-02-03 ENCOUNTER — Ambulatory Visit: Payer: Non-veteran care | Admitting: Podiatry

## 2018-02-03 ENCOUNTER — Encounter: Payer: Self-pay | Admitting: Podiatry

## 2018-02-03 ENCOUNTER — Ambulatory Visit (INDEPENDENT_AMBULATORY_CARE_PROVIDER_SITE_OTHER): Payer: Medicare Other | Admitting: Podiatry

## 2018-02-03 DIAGNOSIS — M79674 Pain in right toe(s): Secondary | ICD-10-CM

## 2018-02-03 DIAGNOSIS — M79675 Pain in left toe(s): Secondary | ICD-10-CM

## 2018-02-03 DIAGNOSIS — E1142 Type 2 diabetes mellitus with diabetic polyneuropathy: Secondary | ICD-10-CM

## 2018-02-03 DIAGNOSIS — B351 Tinea unguium: Secondary | ICD-10-CM | POA: Diagnosis not present

## 2018-02-03 DIAGNOSIS — B353 Tinea pedis: Secondary | ICD-10-CM

## 2018-02-03 DIAGNOSIS — Q828 Other specified congenital malformations of skin: Secondary | ICD-10-CM

## 2018-02-05 MED ORDER — CICLOPIROX OLAMINE 0.77 % EX CREA: TOPICAL_CREAM | CUTANEOUS | 1 refills | Status: DC

## 2018-02-05 NOTE — Progress Notes (Signed)
Subjective: Mr. Guttman is a 67 y.o. AAM who presents to clinic for foot care. He has diabetes, diabetic neuropathy and cc of painful, discolored, thick toenails and painful callus/corn which interfere with activities of daily living. Pain for both is aggravated when wearing enclosed shoe gear. Pain is relieved for both conditions with periodic professional debridement.  He voices no new pedal concerns on today's visit.  Objective: There were no vitals filed for this visit. Vascular Examination: Capillary refill time immediate x 10 digits Dorsalis pedis and Posterior tibial pulses present b/l No digital hair x 10 digits Skin temperature gradient normal b/l  Dermatological Examination: Skin with normal turgor, texture and tone b/l Toenails 1-5 b/l discolored, thick, dystrophic with subungual debris and pain with palpation to nailbeds due to thickness of nails. Porokeratotic lesion submetatarsal head 5 left foot Mild diffuse scaling noted plantar aspect of both feet with mild foot odor consistent with tinea pedis. Webspaces clear b/l. No blisters, no open wounds.  Musculoskeletal: Muscle strength 5/5 to all LE muscle groups  Neurological: Sensation diminished with 10 gram monofilament. Vibratory sensation diminished  Assessment: 1. Painful onychomycosis toenails 1-5 b/l 2.   NIDDM with Diabetic neuropathy 3.   Porokeratotic lesion submetatarsal head 5 left foot 4. Tinea pedis b/l  Plan: 1. Continue diabetic foot care principles.  2. Toenails 1-5 b/l were debrided in length and girth without iatrogenic bleeding. 3. Porokeratotic lesion pared with sterile chisel blade submetatarsal head 5 left foot. 4. Prescription written for Loprox Cream to be applied to both feet and between toes bid x 28 days. 5. Patient to continue diabetic shoes daily 6. Patient to report any pedal injuries to medical professional  7. Follow up 3 months.  8. Patient/POA to call should there be a concern in the  interim.

## 2018-03-14 ENCOUNTER — Ambulatory Visit: Payer: Non-veteran care | Admitting: Podiatry

## 2018-03-17 ENCOUNTER — Encounter: Payer: Self-pay | Admitting: Podiatry

## 2018-03-17 ENCOUNTER — Ambulatory Visit (INDEPENDENT_AMBULATORY_CARE_PROVIDER_SITE_OTHER): Payer: Medicare Other | Admitting: Podiatry

## 2018-03-17 DIAGNOSIS — L84 Corns and callosities: Secondary | ICD-10-CM | POA: Diagnosis not present

## 2018-03-17 DIAGNOSIS — M79674 Pain in right toe(s): Secondary | ICD-10-CM | POA: Diagnosis not present

## 2018-03-17 DIAGNOSIS — B353 Tinea pedis: Secondary | ICD-10-CM | POA: Diagnosis not present

## 2018-03-17 DIAGNOSIS — B351 Tinea unguium: Secondary | ICD-10-CM | POA: Diagnosis not present

## 2018-03-17 DIAGNOSIS — M79675 Pain in left toe(s): Secondary | ICD-10-CM | POA: Diagnosis not present

## 2018-03-17 DIAGNOSIS — E1142 Type 2 diabetes mellitus with diabetic polyneuropathy: Secondary | ICD-10-CM | POA: Diagnosis not present

## 2018-03-19 NOTE — Progress Notes (Signed)
Subjective: Christopher Riley presents to clinic for diabetic foot care. He has diabetes, diabetic neuropathy and cc of painful, discolored, thick toenails and painful calluses.  He voices no new problems on today's visit.  ROS: Per HPI unless specifically indicated in ROS section   Objective: Vascular Examination: Capillary refill time immediate x 10 digits Dorsalis pedis and Posterior tibial pulses present b/l No digital hair x 10 digits Skin temperature gradient normal b/l  Dermatological Examination: Skin with normal turgor, texture and tone b/l Toenails 1-5 b/l discolored, thick, dystrophic with subungual debris and pain with palpation to nailbeds due to thickness of nails. Porokeratotic lesion submetatarsal head 5 left foot Mild diffuse scaling noted plantar aspect of both feet with mild foot odor consistent with tinea pedis. Webspaces clear b/l. No blisters, no open wounds.  Musculoskeletal: Muscle strength 5/5 to all LE muscle groups  Neurological: Sensation diminished with 10 gram monofilament. Vibratory sensation diminished  Assessment: 1. Painful onychomycosis toenails 1-5 b/l 2.   NIDDM with Diabetic neuropathy 3.   Porokeratotic lesion submetatarsal head 5 left foot 4. Tinea pedis b/l  Plan: 1. Continue diabetic foot care principles.  2. Toenails 1-5 b/l were debrided in length and girth without iatrogenic bleeding. 3. Porokeratotic lesion pared with sterile chisel blade submetatarsal head 5 left foot. 4. Continue Loprox Cream to be applied to both feet and between toes bid for an additional 2 weeks. 5. Patient to continue diabetic shoes daily 6. Patient to report any pedal injuries to medical professional  7. Follow up 9 weeks. 8. Patient/POA to call should there be a concern in the interim.

## 2018-04-28 ENCOUNTER — Encounter: Payer: Self-pay | Admitting: Podiatry

## 2018-04-28 ENCOUNTER — Ambulatory Visit (INDEPENDENT_AMBULATORY_CARE_PROVIDER_SITE_OTHER): Payer: Medicare Other | Admitting: Podiatry

## 2018-04-28 DIAGNOSIS — B351 Tinea unguium: Secondary | ICD-10-CM | POA: Diagnosis not present

## 2018-04-28 DIAGNOSIS — M79674 Pain in right toe(s): Secondary | ICD-10-CM | POA: Diagnosis not present

## 2018-04-28 DIAGNOSIS — Q828 Other specified congenital malformations of skin: Secondary | ICD-10-CM

## 2018-04-28 DIAGNOSIS — E1142 Type 2 diabetes mellitus with diabetic polyneuropathy: Secondary | ICD-10-CM | POA: Diagnosis not present

## 2018-04-28 DIAGNOSIS — M79675 Pain in left toe(s): Secondary | ICD-10-CM

## 2018-04-30 NOTE — Progress Notes (Signed)
Subjective: [name] presents to clinic today with h/o diabetic neuropathy and painful, mycotic toenails and callus left foot which interfere with daily activities and routine tasks. Pain is relieved with periodic professional debridement.  Today, [name] has brought in written information of contact person for [place] to send to the [facility] for him to obtain his diabetic shoes.  Objective: Vascular Examination: Capillary refill time immediate x 10 digits Dorsalis pedis and Posterior tibial pulses present b/l No digital hair x 10 digits Skin temperature gradient normal b/l  Dermatological Examination: Skin with normal turgor, texture and tone b/l Toenails 1-5 b/l discolored, thick, dystrophic with subungual debris and pain with palpation to nailbeds due to thickness of nails. Porokeratotic lesion submetatarsal head 5 left foot Resolved scaling  B/l feet  Musculoskeletal: Muscle strength 5/5 to all LE muscle groups Knee brace LLE  Neurological: Sensation diminished with 10 gram monofilament. Vibratory sensation diminished  Assessment: 1. Painful onychomycosis toenails 1-5 b/l 2. NIDDM with Diabetic neuropathy 3.  Porokeratotic lesion submetatarsal head 5 left foot  Plan: 1. Continue diabetic foot care principles.  2. Toenails 1-5 b/l were debrided in length and girth without iatrogenic bleeding. 3. Porokeratotic lesion pared with sterile chisel blade submetatarsal head 5 left foot 4. Prescription for one pair extra depth diabetic shoes with 3 pair heat moldable insoles (for callus submet head 5 left foot) written and will be faxed to [facility] per patient request: faxed to Ms. Shirlee Limerick at [phone] 5. Patient to continue soft, supportive shoe gear 6. Patient to report any pedal injuries to medical professional  7. Follow up 4- 6 weeks per [facility] Authorization. 8. Patient/POA to call should there be a concern in the interim.

## 2018-05-05 ENCOUNTER — Ambulatory Visit: Payer: Non-veteran care | Admitting: Podiatry

## 2018-05-05 ENCOUNTER — Encounter

## 2018-06-09 ENCOUNTER — Encounter: Payer: Self-pay | Admitting: Podiatry

## 2018-06-09 ENCOUNTER — Ambulatory Visit (INDEPENDENT_AMBULATORY_CARE_PROVIDER_SITE_OTHER): Payer: Medicare Other | Admitting: Podiatry

## 2018-06-09 DIAGNOSIS — E1142 Type 2 diabetes mellitus with diabetic polyneuropathy: Secondary | ICD-10-CM

## 2018-06-09 DIAGNOSIS — Q828 Other specified congenital malformations of skin: Secondary | ICD-10-CM

## 2018-06-26 ENCOUNTER — Encounter: Payer: Self-pay | Admitting: Podiatry

## 2018-06-26 NOTE — Progress Notes (Signed)
Subjective: Lincoln Brigham presents with diabetes, diabetic neuropathy and cc of painful, discolored, thick toenails and painful calluses which interfere with activities of daily living. Pain is aggravated when wearing enclosed shoe gear and is relieved with periodic professional debridement.  Pt notes his left foot callus is more tender than the right this morning. He denies any swelling, redness or drainage from the callus.  Pt states he did not check his blood sugar this morning.   States his daughter had surgery at [facility] last night and she is recovering well.  Objective: Vascular Examination: Capillary refill time immediate x 10 digits Dorsalis pedis and Posterior tibial pulses present b/l No digital hair x 10 digits Skin temperature gradient WNL b/l  Dermatological Examination: Skin with normal turgor, texture and tone b/l  Toenails 1-5 b/l well maintained on today  Porokeratotic lesion submet head 5 b/l feet with no edema, no erythema, no flocculence, no drainage. +Tender to palpation.  Musculoskeletal: Muscle strength 5/5 to all LE muscle groups  Neurological: Sensation diminished with 10 gram monofilament. Vibratory sensation diminished  Assessment: 1. Porokeratotic lesion submet head 5 b/l 2. NIDDM with Diabetic neuropathy  Plan: 1. Continue diabetic foot care principles.  2. Porokeratotic lesions b/l submet head 5 pared and enucleated with sterile chisel blade without incident. 3. Patient to continue diabetic shoe gear daily 4. Patient to report any pedal injuries to medical professional  5. Follow up 4-6 weeks per [facility] authorization 6.  Patient/POA to call should there be a concern in the interim.

## 2018-07-07 ENCOUNTER — Ambulatory Visit: Payer: Non-veteran care | Admitting: Podiatry

## 2018-08-23 ENCOUNTER — Encounter: Payer: Self-pay | Admitting: Podiatry

## 2018-08-23 ENCOUNTER — Ambulatory Visit (INDEPENDENT_AMBULATORY_CARE_PROVIDER_SITE_OTHER): Payer: Medicare Other | Admitting: Podiatry

## 2018-08-23 ENCOUNTER — Ambulatory Visit: Payer: Non-veteran care | Admitting: Podiatry

## 2018-08-23 DIAGNOSIS — L84 Corns and callosities: Secondary | ICD-10-CM | POA: Diagnosis not present

## 2018-08-23 DIAGNOSIS — B351 Tinea unguium: Secondary | ICD-10-CM | POA: Diagnosis not present

## 2018-08-23 DIAGNOSIS — M79676 Pain in unspecified toe(s): Secondary | ICD-10-CM | POA: Diagnosis not present

## 2018-08-23 DIAGNOSIS — E1142 Type 2 diabetes mellitus with diabetic polyneuropathy: Secondary | ICD-10-CM

## 2018-08-23 NOTE — Patient Instructions (Signed)

## 2018-08-25 ENCOUNTER — Ambulatory Visit: Payer: Non-veteran care | Admitting: Podiatry

## 2018-09-02 NOTE — Progress Notes (Signed)
Subjective: Christopher Riley presents today for diabetic preventative foot care with cc of painful, discolored, thick toenails and painful callus which interfere with activities of daily living. Pain is aggravated when wearing enclosed shoe gear. Pain is relieved with periodic professional debridement.  Patient is seen at [facility].   Current Outpatient Medications:  .  acetaminophen (TYLENOL) 325 MG tablet, , Disp: , Rfl:  .  ALBUTEROL IN, Inhale 2 puffs into the lungs 4 (four) times daily as needed. , Disp: , Rfl:  .  amoxicillin (AMOXIL) 500 MG capsule, Take 500 mg by mouth as directed. , Disp: , Rfl:  .  ARTIFICIAL TEARS 0.2-0.2-1 % SOLN, , Disp: , Rfl:  .  atenolol (TENORMIN) 25 MG tablet, Take 25 mg by mouth daily., Disp: , Rfl:  .  atorvastatin (LIPITOR) 80 MG tablet, Take 40 mg by mouth every evening. , Disp: , Rfl:  .  brimonidine (ALPHAGAN) 0.2 % ophthalmic solution, Place 1 drop into both eyes at bedtime., Disp: , Rfl:  .  buPROPion (WELLBUTRIN) 100 MG tablet, Take 100 mg by mouth 2 (two) times daily. , Disp: , Rfl:  .  capsaicin (ZOSTRIX) 0.025 % cream, , Disp: , Rfl:  .  carvedilol (COREG) 12.5 MG tablet, , Disp: , Rfl:  .  ciclopirox (LOPROX) 0.77 % cream, Apply to both feet and between toes bid x 28 days., Disp: 30 g, Rfl: 1 .  clonazePAM (KLONOPIN) 2 MG tablet, TAKE 1 TABLET BY MOUTH TWICE DAILY AND 2 TABLETS AT BEDTIME, Disp: , Rfl:  .  COMBIVENT RESPIMAT 20-100 MCG/ACT AERS respimat, , Disp: , Rfl:  .  cyclobenzaprine (FLEXERIL) 10 MG tablet, , Disp: , Rfl:  .  docusate sodium (COLACE) 100 MG capsule, Take 100 mg by mouth 2 (two) times daily., Disp: , Rfl:  .  etodolac (LODINE) 400 MG tablet, Take 400 mg by mouth daily., Disp: , Rfl:  .  furosemide (LASIX) 20 MG tablet, Take 20 mg by mouth daily., Disp: , Rfl:  .  HYDROcodone-acetaminophen (NORCO/VICODIN) 5-325 MG per tablet, Take 1 tablet by mouth every 6 (six) hours as needed for moderate pain., Disp: , Rfl:  .   latanoprost (XALATAN) 0.005 % ophthalmic solution, Place 1 drop into both eyes at bedtime., Disp: , Rfl:  .  Menthol-Methyl Salicylate (THERA-GESIC) 1-15 % CREA, Apply topically 3 (three) times daily., Disp: , Rfl:  .  metFORMIN (GLUCOPHAGE) 1000 MG tablet, Take 1 tablet (1,000 mg total) by mouth 2 (two) times daily with a meal., Disp: , Rfl:  .  methadone (DOLOPHINE) 10 MG tablet, Take 10 mg by mouth 2 (two) times daily., Disp: , Rfl:  .  morphine (MSIR) 30 MG tablet, Take 30 mg by mouth as needed for severe pain., Disp: , Rfl:  .  omeprazole (PRILOSEC) 20 MG capsule, Take 20 mg by mouth daily., Disp: , Rfl:  .  oxyCODONE (OXY IR/ROXICODONE) 5 MG immediate release tablet, Take 1 tablet by mouth twice daily as needed for pain., Disp: , Rfl:  .  PRAMOSONE 1-2.5 % OINT, , Disp: , Rfl:  .  psyllium (METAMUCIL) 58.6 % powder, Take by mouth daily., Disp: , Rfl:  .  QUEtiapine (SEROQUEL) 200 MG tablet, Take 200 mg by mouth at bedtime., Disp: , Rfl:  .  tiZANidine (ZANAFLEX) 4 MG tablet, TAKE ONE TABLET BY MOUTH THREE TIMES DAILY, Disp: 30 tablet, Rfl: 0 .  traZODone (DESYREL) 50 MG tablet, Take 400 mg by mouth at bedtime. Patient  stated that he is to take 8 tablets at bedtime, Disp: , Rfl:  No current facility-administered medications for this visit.   Facility-Administered Medications Ordered in Other Visits:  .  chlorhexidine (HIBICLENS) 4 % liquid, , Topical, UD, Nesi, Marc, MD  No Known Allergies  Vascular Examination: Capillary refill time immediate x 10 digits Dorsalis pedis and Posterior tibial pulses present b/l No digital hair x 10 digits Skin temperature WNL b/l  Dermatological Examination: Skin with normal turgor, texture and tone b/l  Toenails 1-5 b/l discolored, thick, dystrophic with subungual debris and pain with palpation to nailbeds due to thickness of nails.  Hyperkeratotic lesion submet head 5 left foot. No erythema, no edema, no drainage, no  flocculence.  Musculoskeletal: Muscle strength 5/5 to all LE muscle groups  Neurological: Sensation diminished with 10 gram monofilament. Vibratory sensation diminished  Assessment: 1. Painful onychomycosis toenails 1-5 b/l 2. Callus submetatarsal head 5 left foot 3. NIDDM with Diabetic neuropathy  Plan: 1. Continue diabetic foot care principles.  2. Toenails 1-5 b/l were debrided in length and girth without iatrogenic bleeding. 3. Hyperkeratotic lesion pared with sterile chisel blade submetatarsal head 5 left foot 4. Patient to continue soft, supportive shoe gear 5. Patient to report any pedal injuries to medical professional  6. Follow up every 4-6 weeks.  7. Patient/POA to call should there be a concern in the interim.

## 2018-09-21 ENCOUNTER — Ambulatory Visit: Payer: Non-veteran care | Admitting: Podiatry

## 2018-10-04 ENCOUNTER — Encounter: Payer: Self-pay | Admitting: Podiatry

## 2018-10-04 ENCOUNTER — Ambulatory Visit (INDEPENDENT_AMBULATORY_CARE_PROVIDER_SITE_OTHER): Payer: Medicare Other | Admitting: Podiatry

## 2018-10-04 ENCOUNTER — Ambulatory Visit: Payer: Non-veteran care | Admitting: Podiatry

## 2018-10-04 ENCOUNTER — Other Ambulatory Visit: Payer: Self-pay

## 2018-10-04 DIAGNOSIS — B353 Tinea pedis: Secondary | ICD-10-CM | POA: Diagnosis not present

## 2018-10-04 DIAGNOSIS — L84 Corns and callosities: Secondary | ICD-10-CM

## 2018-10-04 DIAGNOSIS — M79676 Pain in unspecified toe(s): Secondary | ICD-10-CM | POA: Diagnosis not present

## 2018-10-04 DIAGNOSIS — E1142 Type 2 diabetes mellitus with diabetic polyneuropathy: Secondary | ICD-10-CM

## 2018-10-04 DIAGNOSIS — B351 Tinea unguium: Secondary | ICD-10-CM | POA: Diagnosis not present

## 2018-10-04 MED ORDER — KETOCONAZOLE 2 % EX CREA: 1.0000 "application " | TOPICAL_CREAM | Freq: Every day | CUTANEOUS | 1 refills | Status: DC

## 2018-10-04 NOTE — Progress Notes (Signed)
Subjective: Christopher Riley presents with diabetes, diabetic neuropathy and cc of painful, discolored, thick toenails and chronic painful callus submetatarsal head 5 left foot which interfere with activities of daily living. Pain is aggravated when wearing enclosed shoe gear. Pain is relieved with periodic professional debridement.  Patient's PCP is located at the [facility] in [place], [place].   Current Outpatient Medications:  .  acetaminophen (TYLENOL) 325 MG tablet, , Disp: , Rfl:  .  ALBUTEROL IN, Inhale 2 puffs into the lungs 4 (four) times daily as needed. , Disp: , Rfl:  .  amoxicillin (AMOXIL) 500 MG capsule, Take 500 mg by mouth as directed. , Disp: , Rfl:  .  ARTIFICIAL TEARS 0.2-0.2-1 % SOLN, , Disp: , Rfl:  .  atenolol (TENORMIN) 25 MG tablet, Take 25 mg by mouth daily., Disp: , Rfl:  .  atorvastatin (LIPITOR) 80 MG tablet, Take 40 mg by mouth every evening. , Disp: , Rfl:  .  brimonidine (ALPHAGAN) 0.2 % ophthalmic solution, Place 1 drop into both eyes at bedtime., Disp: , Rfl:  .  buPROPion (WELLBUTRIN) 100 MG tablet, Take 100 mg by mouth 2 (two) times daily. , Disp: , Rfl:  .  capsaicin (ZOSTRIX) 0.025 % cream, , Disp: , Rfl:  .  carvedilol (COREG) 12.5 MG tablet, , Disp: , Rfl:  .  ciclopirox (LOPROX) 0.77 % cream, Apply to both feet and between toes bid x 28 days., Disp: 30 g, Rfl: 1 .  clonazePAM (KLONOPIN) 2 MG tablet, TAKE 1 TABLET BY MOUTH TWICE DAILY AND 2 TABLETS AT BEDTIME, Disp: , Rfl:  .  COMBIVENT RESPIMAT 20-100 MCG/ACT AERS respimat, , Disp: , Rfl:  .  cyclobenzaprine (FLEXERIL) 10 MG tablet, , Disp: , Rfl:  .  docusate sodium (COLACE) 100 MG capsule, Take 100 mg by mouth 2 (two) times daily., Disp: , Rfl:  .  etodolac (LODINE) 400 MG tablet, Take 400 mg by mouth daily., Disp: , Rfl:  .  furosemide (LASIX) 20 MG tablet, Take 20 mg by mouth daily., Disp: , Rfl:  .  HYDROcodone-acetaminophen (NORCO/VICODIN) 5-325 MG per tablet, Take 1 tablet by mouth  every 6 (six) hours as needed for moderate pain., Disp: , Rfl:  .  latanoprost (XALATAN) 0.005 % ophthalmic solution, Place 1 drop into both eyes at bedtime., Disp: , Rfl:  .  Menthol-Methyl Salicylate (THERA-GESIC) 1-15 % CREA, Apply topically 3 (three) times daily., Disp: , Rfl:  .  metFORMIN (GLUCOPHAGE) 1000 MG tablet, Take 1 tablet (1,000 mg total) by mouth 2 (two) times daily with a meal., Disp: , Rfl:  .  methadone (DOLOPHINE) 10 MG tablet, Take 10 mg by mouth 2 (two) times daily., Disp: , Rfl:  .  morphine (MSIR) 30 MG tablet, Take 30 mg by mouth as needed for severe pain., Disp: , Rfl:  .  omeprazole (PRILOSEC) 20 MG capsule, Take 20 mg by mouth daily., Disp: , Rfl:  .  oxyCODONE (OXY IR/ROXICODONE) 5 MG immediate release tablet, Take 1 tablet by mouth twice daily as needed for pain., Disp: , Rfl:  .  PRAMOSONE 1-2.5 % OINT, , Disp: , Rfl:  .  psyllium (METAMUCIL) 58.6 % powder, Take by mouth daily., Disp: , Rfl:  .  QUEtiapine (SEROQUEL) 200 MG tablet, Take 200 mg by mouth at bedtime., Disp: , Rfl:  .  tiZANidine (ZANAFLEX) 4 MG tablet, TAKE ONE TABLET BY MOUTH THREE TIMES DAILY, Disp: 30 tablet, Rfl: 0 .  traZODone (DESYREL)  50 MG tablet, Take 400 mg by mouth at bedtime. Patient stated that he is to take 8 tablets at bedtime, Disp: , Rfl:  No current facility-administered medications for this visit.   Facility-Administered Medications Ordered in Other Visits:  .  chlorhexidine (HIBICLENS) 4 % liquid, , Topical, UD, Nesi, Marc, MD  No Known Allergies  Vascular Examination: Capillary refill time immediate x 10 digits.  Dorsalis pedis and Posterior tibial pulses present b/l.  No digital hair x 10 digits. Skin temperature gradient within normal limits bilaterally.  Dermatological Examination: Skin with normal turgor and tone b/l.  Diffuse scaling noted peripherally and plantarly b/l feet with mild foot odor.  No interdigital macerations.  No blisters, no weeping. No signs of  secondary bacterial infection noted.  Toenails 1-5 b/l discolored, thick, dystrophic with subungual debris and pain with palpation to nailbeds due to thickness of nails.  Hyperkeratotic lesion submetatarsal head 5 left foot.  No erythema, no edema, no drainage, no flocculence noted.  Musculoskeletal: Muscle strength 5/5 to all LE muscle groups.  Neurological: Sensation diminished with 10 gram monofilament.  Vibratory sensation diminished  Assessment: 1. Painful onychomycosis toenails 1-5 b/l 2. Callus submetatarsal head 5 left foot 3. Tinea pedis bilaterally 4. NIDDM with Diabetic neuropathy  Plan: 1. Continue diabetic foot care principles.  Literature dispensed. 2. Toenails 1-5 b/l were debrided in length and girth without iatrogenic bleeding. For tinea pedis, prescription sent to pharmacy for Ketoconazole Cream 2% to be applied to both feet and between toes qd x 6 weeks. 3. Hyperkeratotic lesion pared submetatarsal head 5 left foot with sterile scalpel blade without incident.   4. Patient to continue soft, supportive shoe gear.  Patient given written prescription for 1 pair extra-depth shoes and 3 pair custom molded insoles.  He will take his prescription to the [facility]. 5. Patient to report any pedal injuries to medical professional immediately. 6. Follow up 6 weeks as covered by the [employer]. 7. Patient/POA to call should there be a concern in the interim.

## 2018-10-04 NOTE — Patient Instructions (Signed)

## 2018-11-15 ENCOUNTER — Other Ambulatory Visit: Payer: Self-pay

## 2018-11-15 ENCOUNTER — Ambulatory Visit (INDEPENDENT_AMBULATORY_CARE_PROVIDER_SITE_OTHER): Payer: Medicare Other | Admitting: Podiatry

## 2018-11-15 DIAGNOSIS — L84 Corns and callosities: Secondary | ICD-10-CM | POA: Diagnosis not present

## 2018-11-15 DIAGNOSIS — M79676 Pain in unspecified toe(s): Secondary | ICD-10-CM

## 2018-11-15 DIAGNOSIS — E1142 Type 2 diabetes mellitus with diabetic polyneuropathy: Secondary | ICD-10-CM

## 2018-11-15 DIAGNOSIS — B351 Tinea unguium: Secondary | ICD-10-CM | POA: Diagnosis not present

## 2018-11-15 NOTE — Patient Instructions (Signed)
Onychomycosis/Fungal Toenails  WHAT IS IT? An infection that lies within the keratin of your nail plate that is caused by a fungus.  WHY ME? Fungal infections affect all ages, sexes, races, and creeds.  There may be many factors that predispose you to a fungal infection such as age, coexisting medical conditions such as diabetes, or an autoimmune disease; stress, medications, fatigue, genetics, etc.  Bottom line: fungus thrives in a warm, moist environment and your shoes offer such a location.  IS IT CONTAGIOUS? Theoretically, yes.  You do not want to share shoes, nail clippers or files with someone who has fungal toenails.  Walking around barefoot in the same room or sleeping in the same bed is unlikely to transfer the organism.  It is important to realize, however, that fungus can spread easily from one nail to the next on the same foot.  HOW DO WE TREAT THIS?  There are several ways to treat this condition.  Treatment may depend on many factors such as age, medications, pregnancy, liver and kidney conditions, etc.  It is best to ask your doctor which options are available to you.  1. No treatment.   Unlike many other medical concerns, you can live with this condition.  However for many people this can be a painful condition and may lead to ingrown toenails or a bacterial infection.  It is recommended that you keep the nails cut short to help reduce the amount of fungal nail. 2. Topical treatment.  These range from herbal remedies to prescription strength nail lacquers.  About 40-50% effective, topicals require twice daily application for approximately 9 to 12 months or until an entirely new nail has grown out.  The most effective topicals are medical grade medications available through physicians offices. 3. Oral antifungal medications.  With an 80-90% cure rate, the most common oral medication requires 3 to 4 months of therapy and stays in your system for a year as the new nail grows out.  Oral  antifungal medications do require blood work to make sure it is a safe drug for you.  A liver function panel will be performed prior to starting the medication and after the first month of treatment.  It is important to have the blood work performed to avoid any harmful side effects.  In general, this medication safe but blood work is required. 4. Laser Therapy.  This treatment is performed by applying a specialized laser to the affected nail plate.  This therapy is noninvasive, fast, and non-painful.  It is not covered by insurance and is therefore, out of pocket.  The results have been very good with a 80-95% cure rate.  [facility] is the only practice in the area to offer this therapy. 5. Permanent Nail Avulsion.  Removing the entire nail so that a new nail will not grow back. Diabetes Mellitus and Foot Care Foot care is an important part of your health, especially when you have diabetes. Diabetes may cause you to have problems because of poor blood flow (circulation) to your feet and legs, which can cause your skin to:  Become thinner and drier.  Break more easily.  Heal more slowly.  Peel and crack. You may also have nerve damage (neuropathy) in your legs and feet, causing decreased feeling in them. This means that you may not notice minor injuries to your feet that could lead to more serious problems. Noticing and addressing any potential problems early is the best way to prevent future   foot problems. How to care for your feet Foot hygiene  Wash your feet daily with warm water and mild soap. Do not use hot water. Then, pat your feet and the areas between your toes until they are completely dry. Do not soak your feet as this can dry your skin.  Trim your toenails straight across. Do not dig under them or around the cuticle. File the edges of your nails with an emery board or nail file.  Apply a moisturizing lotion or petroleum jelly to the skin on your feet and to dry, brittle  toenails. Use lotion that does not contain alcohol and is unscented. Do not apply lotion between your toes. Shoes and socks  Wear clean socks or stockings every day. Make sure they are not too tight. Do not wear knee-high stockings since they may decrease blood flow to your legs.  Wear shoes that fit properly and have enough cushioning. Always look in your shoes before you put them on to be sure there are no objects inside.  To break in new shoes, wear them for just a few hours a day. This prevents injuries on your feet. Wounds, scrapes, corns, and calluses  Check your feet daily for blisters, cuts, bruises, sores, and redness. If you cannot see the bottom of your feet, use a mirror or ask someone for help.  Do not cut corns or calluses or try to remove them with medicine.  If you find a minor scrape, cut, or break in the skin on your feet, keep it and the skin around it clean and dry. You may clean these areas with mild soap and water. Do not clean the area with peroxide, alcohol, or iodine.  If you have a wound, scrape, corn, or callus on your foot, look at it several times a day to make sure it is healing and not infected. Check for: ? Redness, swelling, or pain. ? Fluid or blood. ? Warmth. ? Pus or a bad smell. General instructions  Do not cross your legs. This may decrease blood flow to your feet.  Do not use heating pads or hot water bottles on your feet. They may burn your skin. If you have lost feeling in your feet or legs, you may not know this is happening until it is too late.  Protect your feet from hot and cold by wearing shoes, such as at the beach or on hot pavement.  Schedule a complete foot exam at least once a year (annually) or more often if you have foot problems. If you have foot problems, report any cuts, sores, or bruises to your health care provider immediately. Contact a health care provider if:  You have a medical condition that increases your risk of  infection and you have any cuts, sores, or bruises on your feet.  You have an injury that is not healing.  You have redness on your legs or feet.  You feel burning or tingling in your legs or feet.  You have pain or cramps in your legs and feet.  Your legs or feet are numb.  Your feet always feel cold.  You have pain around a toenail. Get help right away if:  You have a wound, scrape, corn, or callus on your foot and: ? You have pain, swelling, or redness that gets worse. ? You have fluid or blood coming from the wound, scrape, corn, or callus. ? Your wound, scrape, corn, or callus feels warm to the touch. ? You have   pus or a bad smell coming from the wound, scrape, corn, or callus. ? You have a fever. ? You have a red line going up your leg. Summary  Check your feet every day for cuts, sores, red spots, swelling, and blisters.  Moisturize feet and legs daily.  Wear shoes that fit properly and have enough cushioning.  If you have foot problems, report any cuts, sores, or bruises to your health care provider immediately.  Schedule a complete foot exam at least once a year (annually) or more often if you have foot problems. This information is not intended to replace advice given to you by your health care provider. Make sure you discuss any questions you have with your health care provider. Document Released: 07/09/2000 Document Revised: 08/24/2017 Document Reviewed: 08/13/2016 Elsevier Interactive Patient Education  2019 [employer]  Corns and Calluses Corns are small areas of thickened skin that occur on the top, sides, or tip of a toe. They contain a cone-shaped core with a point that can press on a nerve below. This causes pain.  Calluses are areas of thickened skin that can occur anywhere on the body, including the hands, fingers, palms, soles of the feet, and heels. Calluses are usually larger than corns. What are the causes? Corns and calluses are caused by rubbing  (friction) or pressure, such as from shoes that are too tight or do not fit properly. What increases the risk? Corns are more likely to develop in people who have misshapen toes (toe deformities), such as hammer toes. Calluses can occur with friction to any area of the skin. They are more likely to develop in people who:  Work with their hands.  Wear shoes that fit poorly, are too tight, or are high-heeled.  Have toe deformities. What are the signs or symptoms? Symptoms of a corn or callus include:  A hard growth on the skin.  Pain or tenderness under the skin.  Redness and swelling.  Increased discomfort while wearing tight-fitting shoes, if your feet are affected. If a corn or callus becomes infected, symptoms may include:  Redness and swelling that gets worse.  Pain.  Fluid, blood, or pus draining from the corn or callus. How is this diagnosed? Corns and calluses may be diagnosed based on your symptoms, your medical history, and a physical exam. How is this treated? Treatment for corns and calluses may include:  Removing the cause of the friction or pressure. This may involve: ? Changing your shoes. ? Wearing shoe inserts (orthotics) or other protective layers in your shoes, such as a corn pad. ? Wearing gloves.  Applying medicine to the skin (topical medicine) to help soften skin in the hardened, thickened areas.  Removing layers of dead skin with a file to reduce the size of the corn or callus.  Removing the corn or callus with a scalpel or laser.  Taking antibiotic medicines, if your corn or callus is infected.  Having surgery, if a toe deformity is the cause. Follow these instructions at home:   Take over-the-counter and prescription medicines only as told by your health care provider.  If you were prescribed an antibiotic, take it as told by your health care provider. Do not stop taking it even if your condition starts to improve.  Wear shoes that fit  well. Avoid wearing high-heeled shoes and shoes that are too tight or too loose.  Wear any padding, protective layers, gloves, or orthotics as told by your health care provider.  Soak your   hands or feet and then use a file or pumice stone to soften your corn or callus. Do this as told by your health care provider.  Check your corn or callus every day for symptoms of infection. Contact a health care provider if you:  Notice that your symptoms do not improve with treatment.  Have redness or swelling that gets worse.  Notice that your corn or callus becomes painful.  Have fluid, blood, or pus coming from your corn or callus.  Have new symptoms. Summary  Corns are small areas of thickened skin that occur on the top, sides, or tip of a toe.  Calluses are areas of thickened skin that can occur anywhere on the body, including the hands, fingers, palms, and soles of the feet. Calluses are usually larger than corns.  Corns and calluses are caused by rubbing (friction) or pressure, such as from shoes that are too tight or do not fit properly.  Treatment may include wearing any padding, protective layers, gloves, or orthotics as told by your health care provider. This information is not intended to replace advice given to you by your health care provider. Make sure you discuss any questions you have with your health care provider. Document Released: 04/17/2004 Document Revised: 05/25/2017 Document Reviewed: 05/25/2017 Elsevier Interactive Patient Education  2019 [employer] Inc[employer]  

## 2018-11-20 ENCOUNTER — Telehealth: Payer: Self-pay | Admitting: Podiatry

## 2018-11-20 ENCOUNTER — Telehealth: Payer: Self-pay | Admitting: *Deleted

## 2018-11-20 NOTE — Telephone Encounter (Signed)
Pt states he was seen last week for a nail trim, and would like to be seen to have the big toenails trimmed more.

## 2018-11-20 NOTE — Telephone Encounter (Signed)
Pt would like to know if he can have another appt to trim the big toes only. Stated that the toe nalis are not low enough. Please call when possible

## 2018-11-22 ENCOUNTER — Other Ambulatory Visit: Payer: Self-pay

## 2018-11-22 ENCOUNTER — Ambulatory Visit (INDEPENDENT_AMBULATORY_CARE_PROVIDER_SITE_OTHER): Payer: Medicare Other | Admitting: Podiatry

## 2018-11-22 ENCOUNTER — Encounter: Payer: Self-pay | Admitting: Podiatry

## 2018-11-22 DIAGNOSIS — B351 Tinea unguium: Secondary | ICD-10-CM

## 2018-11-22 DIAGNOSIS — M79676 Pain in unspecified toe(s): Secondary | ICD-10-CM

## 2018-11-22 NOTE — Progress Notes (Signed)
Subjective: Christopher Riley presents today with history of diabetic neuropathy with cc of painful, mycotic toenails.  Pain is aggravated when wearing enclosed shoe gear and relieved with periodic professional debridement.  Patient has diabtic neuropathy managed with gabapentin.  System, Pcp Not In    Current Outpatient Medications:  .  acetaminophen (TYLENOL) 325 MG tablet, , Disp: , Rfl:  .  ALBUTEROL IN, Inhale 2 puffs into the lungs 4 (four) times daily as needed. , Disp: , Rfl:  .  alprazolam (XANAX) 2 MG tablet, Take 2 mg by mouth every 6 (six) hours as needed., Disp: , Rfl:  .  amoxicillin (AMOXIL) 500 MG capsule, Take 500 mg by mouth as directed. , Disp: , Rfl:  .  ARTIFICIAL TEARS 0.2-0.2-1 % SOLN, , Disp: , Rfl:  .  atenolol (TENORMIN) 25 MG tablet, Take 25 mg by mouth daily., Disp: , Rfl:  .  atorvastatin (LIPITOR) 80 MG tablet, Take 40 mg by mouth every evening. , Disp: , Rfl:  .  brimonidine (ALPHAGAN) 0.2 % ophthalmic solution, Place 1 drop into both eyes at bedtime., Disp: , Rfl:  .  buPROPion (WELLBUTRIN) 100 MG tablet, Take 100 mg by mouth 2 (two) times daily. , Disp: , Rfl:  .  capsaicin (ZOSTRIX) 0.025 % cream, , Disp: , Rfl:  .  carvedilol (COREG) 12.5 MG tablet, , Disp: , Rfl:  .  ciclopirox (LOPROX) 0.77 % cream, Apply to both feet and between toes bid x 28 days., Disp: 30 g, Rfl: 1 .  clonazePAM (KLONOPIN) 2 MG tablet, TAKE 1 TABLET BY MOUTH TWICE DAILY AND 2 TABLETS AT BEDTIME, Disp: , Rfl:  .  COMBIVENT RESPIMAT 20-100 MCG/ACT AERS respimat, , Disp: , Rfl:  .  cyclobenzaprine (FLEXERIL) 10 MG tablet, , Disp: , Rfl:  .  docusate sodium (COLACE) 100 MG capsule, Take 100 mg by mouth 2 (two) times daily., Disp: , Rfl:  .  etodolac (LODINE) 400 MG tablet, Take 400 mg by mouth daily., Disp: , Rfl:  .  furosemide (LASIX) 20 MG tablet, Take 20 mg by mouth daily., Disp: , Rfl:  .  gabapentin (NEURONTIN) 100 MG capsule, , Disp: , Rfl:  .  HYDROcodone-acetaminophen  (NORCO/VICODIN) 5-325 MG per tablet, Take 1 tablet by mouth every 6 (six) hours as needed for moderate pain., Disp: , Rfl:  .  ketoconazole (NIZORAL) 2 % cream, Apply 1 application topically daily. Apply to both feet and between toes once daily for 6 weeks, Disp: 30 g, Rfl: 1 .  latanoprost (XALATAN) 0.005 % ophthalmic solution, Place 1 drop into both eyes at bedtime., Disp: , Rfl:  .  Menthol-Methyl Salicylate (THERA-GESIC) 1-15 % CREA, Apply topically 3 (three) times daily., Disp: , Rfl:  .  metFORMIN (GLUCOPHAGE) 1000 MG tablet, Take 1 tablet (1,000 mg total) by mouth 2 (two) times daily with a meal., Disp: , Rfl:  .  methadone (DOLOPHINE) 10 MG tablet, Take 10 mg by mouth 2 (two) times daily., Disp: , Rfl:  .  morphine (MSIR) 30 MG tablet, Take 30 mg by mouth as needed for severe pain., Disp: , Rfl:  .  omeprazole (PRILOSEC) 20 MG capsule, Take 20 mg by mouth daily., Disp: , Rfl:  .  oxyCODONE (OXY IR/ROXICODONE) 5 MG immediate release tablet, Take 1 tablet by mouth twice daily as needed for pain., Disp: , Rfl:  .  PRAMOSONE 1-2.5 % OINT, , Disp: , Rfl:  .  psyllium (METAMUCIL) 58.6 % powder, Take by  mouth daily., Disp: , Rfl:  .  QUEtiapine (SEROQUEL) 200 MG tablet, Take 200 mg by mouth at bedtime., Disp: , Rfl:  .  tiZANidine (ZANAFLEX) 4 MG tablet, TAKE ONE TABLET BY MOUTH THREE TIMES DAILY, Disp: 30 tablet, Rfl: 0 .  traZODone (DESYREL) 50 MG tablet, Take 400 mg by mouth at bedtime. Patient stated that he is to take 8 tablets at bedtime, Disp: , Rfl:  No current facility-administered medications for this visit.   Facility-Administered Medications Ordered in Other Visits:  .  chlorhexidine (HIBICLENS) 4 % liquid, , Topical, UD, Nesi, Marc, MD  No Known Allergies  Objective:  Vascular Examination: Capillary refill time immediate x 10 digits.  Dorsalis pedis and Posterior tibial pulses present b/l.  Digital hair x 10 digits was absent.  Skin temperature gradient WNL  b/l.  Dermatological Examination: Skin with normal turgor, texture and tone b/l.  Toenails 1-5 b/l discolored, thick, dystrophic with subungual debris and pain with palpation to nailbeds due to thickness of nails.  Hyperkeratotic lesion submet head 5 left foot. No erythema, no edema, no drainage, no flocculence noted.  Musculoskeletal: Muscle strength 5/5 to all muscle groups b/l.  Neurological: Sensation with 10 gram monofilament is absent b/l.  Vibratory sensation absent b/l.  Assessment: 1. Painful onychomycosis toenails 1-5 b/l 2. Callous submet head 5 left foot 3. NIDDM with neuropathy  Plan: 1. Toenails 1-5 b/l were debrided in length and girth without iatrogenic bleeding. Calluses pared submetatarsal head(s) 5 left foot utilizing sterile scalpel blade without incident. 2. Patient to continue soft, supportive shoe gear 3. Patient to report any pedal injuries to medical professional. 4. Follow up 6 weeks per [employer]. 5. Patient/POA to call should there be a concern in the interim.

## 2018-11-29 NOTE — Progress Notes (Signed)
Patient nails were filed down with no iatrogenic bleeding

## 2018-12-24 ENCOUNTER — Encounter: Payer: Self-pay | Admitting: Podiatry

## 2019-02-14 ENCOUNTER — Encounter: Payer: Self-pay | Admitting: Podiatry

## 2019-02-14 ENCOUNTER — Other Ambulatory Visit: Payer: Self-pay

## 2019-02-14 ENCOUNTER — Ambulatory Visit (INDEPENDENT_AMBULATORY_CARE_PROVIDER_SITE_OTHER): Payer: Medicare Other | Admitting: Podiatry

## 2019-02-14 DIAGNOSIS — E1142 Type 2 diabetes mellitus with diabetic polyneuropathy: Secondary | ICD-10-CM | POA: Diagnosis not present

## 2019-02-14 DIAGNOSIS — B351 Tinea unguium: Secondary | ICD-10-CM | POA: Diagnosis not present

## 2019-02-14 DIAGNOSIS — M79676 Pain in unspecified toe(s): Secondary | ICD-10-CM

## 2019-02-14 DIAGNOSIS — L84 Corns and callosities: Secondary | ICD-10-CM

## 2019-02-14 NOTE — Patient Instructions (Signed)
Diabetes Mellitus and Foot Care Foot care is an important part of your health, especially when you have diabetes. Diabetes may cause you to have problems because of poor blood flow (circulation) to your feet and legs, which can cause your skin to:  Become thinner and drier.  Break more easily.  Heal more slowly.  Peel and crack. You may also have nerve damage (neuropathy) in your legs and feet, causing decreased feeling in them. This means that you may not notice minor injuries to your feet that could lead to more serious problems. Noticing and addressing any potential problems early is the best way to prevent future foot problems. How to care for your feet Foot hygiene  Wash your feet daily with warm water and mild soap. Do not use hot water. Then, pat your feet and the areas between your toes until they are completely dry. Do not soak your feet as this can dry your skin.  Trim your toenails straight across. Do not dig under them or around the cuticle. File the edges of your nails with an emery board or nail file.  Apply a moisturizing lotion or petroleum jelly to the skin on your feet and to dry, brittle toenails. Use lotion that does not contain alcohol and is unscented. Do not apply lotion between your toes. Shoes and socks  Wear clean socks or stockings every day. Make sure they are not too tight. Do not wear knee-high stockings since they may decrease blood flow to your legs.  Wear shoes that fit properly and have enough cushioning. Always look in your shoes before you put them on to be sure there are no objects inside.  To break in new shoes, wear them for just a few hours a day. This prevents injuries on your feet. Wounds, scrapes, corns, and calluses  Check your feet daily for blisters, cuts, bruises, sores, and redness. If you cannot see the bottom of your feet, use a mirror or ask someone for help.  Do not cut corns or calluses or try to remove them with medicine.  If you  find a minor scrape, cut, or break in the skin on your feet, keep it and the skin around it clean and dry. You may clean these areas with mild soap and water. Do not clean the area with peroxide, alcohol, or iodine.  If you have a wound, scrape, corn, or callus on your foot, look at it several times a day to make sure it is healing and not infected. Check for: ? Redness, swelling, or pain. ? Fluid or blood. ? Warmth. ? Pus or a bad smell. General instructions  Do not cross your legs. This may decrease blood flow to your feet.  Do not use heating pads or hot water bottles on your feet. They may burn your skin. If you have lost feeling in your feet or legs, you may not know this is happening until it is too late.  Protect your feet from hot and cold by wearing shoes, such as at the beach or on hot pavement.  Schedule a complete foot exam at least once a year (annually) or more often if you have foot problems. If you have foot problems, report any cuts, sores, or bruises to your health care provider immediately. Contact a health care provider if:  You have a medical condition that increases your risk of infection and you have any cuts, sores, or bruises on your feet.  You have an injury that is not   healing.  You have redness on your legs or feet.  You feel burning or tingling in your legs or feet.  You have pain or cramps in your legs and feet.  Your legs or feet are numb.  Your feet always feel cold.  You have pain around a toenail. Get help right away if:  You have a wound, scrape, corn, or callus on your foot and: ? You have pain, swelling, or redness that gets worse. ? You have fluid or blood coming from the wound, scrape, corn, or callus. ? Your wound, scrape, corn, or callus feels warm to the touch. ? You have pus or a bad smell coming from the wound, scrape, corn, or callus. ? You have a fever. ? You have a red line going up your leg. Summary  Check your feet every day  for cuts, sores, red spots, swelling, and blisters.  Moisturize feet and legs daily.  Wear shoes that fit properly and have enough cushioning.  If you have foot problems, report any cuts, sores, or bruises to your health care provider immediately.  Schedule a complete foot exam at least once a year (annually) or more often if you have foot problems. This information is not intended to replace advice given to you by your health care provider. Make sure you discuss any questions you have with your health care provider. Document Released: 07/09/2000 Document Revised: 08/24/2017 Document Reviewed: 08/13/2016 Elsevier Patient Education  2020 [employer]   Onychomycosis/Fungal Toenails  WHAT IS IT? An infection that lies within the keratin of your nail plate that is caused by a fungus.  WHY ME? Fungal infections affect all ages, sexes, races, and creeds.  There may be many factors that predispose you to a fungal infection such as age, coexisting medical conditions such as diabetes, or an autoimmune disease; stress, medications, fatigue, genetics, etc.  Bottom line: fungus thrives in a warm, moist environment and your shoes offer such a location.  IS IT CONTAGIOUS? Theoretically, yes.  You do not want to share shoes, nail clippers or files with someone who has fungal toenails.  Walking around barefoot in the same room or sleeping in the same bed is unlikely to transfer the organism.  It is important to realize, however, that fungus can spread easily from one nail to the next on the same foot.  HOW DO WE TREAT THIS?  There are several ways to treat this condition.  Treatment may depend on many factors such as age, medications, pregnancy, liver and kidney conditions, etc.  It is best to ask your doctor which options are available to you.  1. No treatment.   Unlike many other medical concerns, you can live with this condition.  However for many people this can be a painful condition and may lead to  ingrown toenails or a bacterial infection.  It is recommended that you keep the nails cut short to help reduce the amount of fungal nail. 2. Topical treatment.  These range from herbal remedies to prescription strength nail lacquers.  About 40-50% effective, topicals require twice daily application for approximately 9 to 12 months or until an entirely new nail has grown out.  The most effective topicals are medical grade medications available through physicians offices. 3. Oral antifungal medications.  With an 80-90% cure rate, the most common oral medication requires 3 to 4 months of therapy and stays in your system for a year as the new nail grows out.  Oral antifungal medications do require   blood work to make sure it is a safe drug for you.  A liver function panel will be performed prior to starting the medication and after the first month of treatment.  It is important to have the blood work performed to avoid any harmful side effects.  In general, this medication safe but blood work is required. 4. Laser Therapy.  This treatment is performed by applying a specialized laser to the affected nail plate.  This therapy is noninvasive, fast, and non-painful.  It is not covered by insurance and is therefore, out of pocket.  The results have been very good with a 80-95% cure rate.  [facility] is the only practice in the area to offer this therapy. 5. Permanent Nail Avulsion.  Removing the entire nail so that a new nail will not grow back. 

## 2019-02-18 NOTE — Progress Notes (Signed)
Subjective:  Christopher Riley presents to clinic today with cc of  painful, thick, discolored, elongated toenails 1-5 b/l that become tender and cannot cut because of thickness.  Pain is aggravated when wearing enclosed shoe gear.   No Known Allergies   Objective: Physical Examination:  Vascular Examination: Capillary refill time immediate x 10 digits.  Palpable DP/PT pulses b/l.  Digital hair absent b/l.  No edema noted b/l.  Skin temperature gradient WNL b/l.  Dermatological Examination: Skin with normal turgor, texture and tone b/l.  Elongated, thick, discolored brittle toenails with subungual debris and pain on dorsal palpation of nailbeds 1-5 b/l.  Hyperkeratotic lesion submet head 5 left foot with tenderness to palpation. No edema, no erythema, no drainage, no flocculence.  Musculoskeletal Examination: Muscle strength 5/5 to all muscle groups b/l.  No pain, crepitus or joint discomfort with active/passive ROM.  Neurological Examination: Sensation absent with 10 gram monofilament.  Vibratory sensation absent b/l.  Assessment: Mycotic nail infection with pain 1-5 b/l Callus submet head 5 left foot NIDDM with neuropathy  Plan: 1. Toenails 1-5 b/l were debrided in length and girth without iatrogenic laceration. 2. Callus pared submetatarsal head 5 left foot utilizing sterile scalpel blade without incident. 3. Continue soft, supportive shoe gear daily. 3.  Report any pedal injuries to medical professional. 4.  Follow up 9 weeks. 5.  Patient/POA to call should there be a question/concern in there interim.

## 2019-02-21 ENCOUNTER — Encounter: Payer: Self-pay | Admitting: Podiatry

## 2019-02-21 ENCOUNTER — Ambulatory Visit (INDEPENDENT_AMBULATORY_CARE_PROVIDER_SITE_OTHER): Payer: Medicare Other | Admitting: Podiatry

## 2019-02-21 ENCOUNTER — Other Ambulatory Visit: Payer: Self-pay

## 2019-02-21 DIAGNOSIS — L84 Corns and callosities: Secondary | ICD-10-CM

## 2019-02-21 NOTE — Patient Instructions (Signed)
Corns and Calluses Corns are small areas of thickened skin that occur on the top, sides, or tip of a toe. They contain a cone-shaped core with a point that can press on a nerve below. This causes pain.  Calluses are areas of thickened skin that can occur anywhere on the body, including the hands, fingers, palms, soles of the feet, and heels. Calluses are usually larger than corns. What are the causes? Corns and calluses are caused by rubbing (friction) or pressure, such as from shoes that are too tight or do not fit properly. What increases the risk? Corns are more likely to develop in people who have misshapen toes (toe deformities), such as hammer toes. Calluses can occur with friction to any area of the skin. They are more likely to develop in people who:  Work with their hands.  Wear shoes that fit poorly, are too tight, or are high-heeled.  Have toe deformities. What are the signs or symptoms? Symptoms of a corn or callus include:  A hard growth on the skin.  Pain or tenderness under the skin.  Redness and swelling.  Increased discomfort while wearing tight-fitting shoes, if your feet are affected. If a corn or callus becomes infected, symptoms may include:  Redness and swelling that gets worse.  Pain.  Fluid, blood, or pus draining from the corn or callus. How is this diagnosed? Corns and calluses may be diagnosed based on your symptoms, your medical history, and a physical exam. How is this treated? Treatment for corns and calluses may include:  Removing the cause of the friction or pressure. This may involve: ? Changing your shoes. ? Wearing shoe inserts (orthotics) or other protective layers in your shoes, such as a corn pad. ? Wearing gloves.  Applying medicine to the skin (topical medicine) to help soften skin in the hardened, thickened areas.  Removing layers of dead skin with a file to reduce the size of the corn or callus.  Removing the corn or callus with a  scalpel or laser.  Taking antibiotic medicines, if your corn or callus is infected.  Having surgery, if a toe deformity is the cause. Follow these instructions at home:   Take over-the-counter and prescription medicines only as told by your health care provider.  If you were prescribed an antibiotic, take it as told by your health care provider. Do not stop taking it even if your condition starts to improve.  Wear shoes that fit well. Avoid wearing high-heeled shoes and shoes that are too tight or too loose.  Wear any padding, protective layers, gloves, or orthotics as told by your health care provider.  Soak your hands or feet and then use a file or pumice stone to soften your corn or callus. Do this as told by your health care provider.  Check your corn or callus every day for symptoms of infection. Contact a health care provider if you:  Notice that your symptoms do not improve with treatment.  Have redness or swelling that gets worse.  Notice that your corn or callus becomes painful.  Have fluid, blood, or pus coming from your corn or callus.  Have new symptoms. Summary  Corns are small areas of thickened skin that occur on the top, sides, or tip of a toe.  Calluses are areas of thickened skin that can occur anywhere on the body, including the hands, fingers, palms, and soles of the feet. Calluses are usually larger than corns.  Corns and calluses are caused by   rubbing (friction) or pressure, such as from shoes that are too tight or do not fit properly.  Treatment may include wearing any padding, protective layers, gloves, or orthotics as told by your health care provider. This information is not intended to replace advice given to you by your health care provider. Make sure you discuss any questions you have with your health care provider. Document Released: 04/17/2004 Document Revised: 11/01/2018 Document Reviewed: 05/25/2017 Elsevier Patient Education  [employer].  Diabetes Mellitus and [place] care is an important part of your health, especially when you have diabetes. Diabetes may cause you to have problems because of poor blood flow (circulation) to your feet and legs, which can cause your skin to:  Become thinner and drier.  Break more easily.  Heal more slowly.  Peel and crack. You may also have nerve damage (neuropathy) in your legs and feet, causing decreased feeling in them. This means that you may not notice minor injuries to your feet that could lead to more serious problems. Noticing and addressing any potential problems early is the best way to prevent future foot problems. How to care for your feet Foot hygiene  Wash your feet daily with warm water and mild soap. Do not use hot water. Then, pat your feet and the areas between your toes until they are completely dry. Do not soak your feet as this can dry your skin.  Trim your toenails straight across. Do not dig under them or around the cuticle. File the edges of your nails with an emery board or nail file.  Apply a moisturizing lotion or petroleum jelly to the skin on your feet and to dry, brittle toenails. Use lotion that does not contain alcohol and is unscented. Do not apply lotion between your toes. Shoes and socks  Wear clean socks or stockings every day. Make sure they are not too tight. Do not wear knee-high stockings since they may decrease blood flow to your legs.  Wear shoes that fit properly and have enough cushioning. Always look in your shoes before you put them on to be sure there are no objects inside.  To break in new shoes, wear them for just a few hours a day. This prevents injuries on your feet. Wounds, scrapes, corns, and calluses  Check your feet daily for blisters, cuts, bruises, sores, and redness. If you cannot see the bottom of your feet, use a mirror or ask someone for help.  Do not cut corns or calluses or try to remove them with medicine.   If you find a minor scrape, cut, or break in the skin on your feet, keep it and the skin around it clean and dry. You may clean these areas with mild soap and water. Do not clean the area with peroxide, alcohol, or iodine.  If you have a wound, scrape, corn, or callus on your foot, look at it several times a day to make sure it is healing and not infected. Check for: ? Redness, swelling, or pain. ? Fluid or blood. ? Warmth. ? Pus or a bad smell. General instructions  Do not cross your legs. This may decrease blood flow to your feet.  Do not use heating pads or hot water bottles on your feet. They may burn your skin. If you have lost feeling in your feet or legs, you may not know this is happening until it is too late.  Protect your feet from hot and cold by wearing shoes,  such as at the beach or on hot pavement.  Schedule a complete foot exam at least once a year (annually) or more often if you have foot problems. If you have foot problems, report any cuts, sores, or bruises to your health care provider immediately. Contact a health care provider if:  You have a medical condition that increases your risk of infection and you have any cuts, sores, or bruises on your feet.  You have an injury that is not healing.  You have redness on your legs or feet.  You feel burning or tingling in your legs or feet.  You have pain or cramps in your legs and feet.  Your legs or feet are numb.  Your feet always feel cold.  You have pain around a toenail. Get help right away if:  You have a wound, scrape, corn, or callus on your foot and: ? You have pain, swelling, or redness that gets worse. ? You have fluid or blood coming from the wound, scrape, corn, or callus. ? Your wound, scrape, corn, or callus feels warm to the touch. ? You have pus or a bad smell coming from the wound, scrape, corn, or callus. ? You have a fever. ? You have a red line going up your leg. Summary  Check your feet  every day for cuts, sores, red spots, swelling, and blisters.  Moisturize feet and legs daily.  Wear shoes that fit properly and have enough cushioning.  If you have foot problems, report any cuts, sores, or bruises to your health care provider immediately.  Schedule a complete foot exam at least once a year (annually) or more often if you have foot problems. This information is not intended to replace advice given to you by your health care provider. Make sure you discuss any questions you have with your health care provider. Document Released: 07/09/2000 Document Revised: 08/24/2017 Document Reviewed: 08/13/2016 Elsevier Patient Education  2020 [employer].

## 2019-02-21 NOTE — Progress Notes (Signed)
Subjective: Christopher Riley presents to clinic for calluses b/l. He feels they were trimmed enough. He notes no new concerns on today's visit.   Current Outpatient Medications:  .  acetaminophen (TYLENOL) 325 MG tablet, , Disp: , Rfl:  .  ALBUTEROL IN, Inhale 2 puffs into the lungs 4 (four) times daily as needed. , Disp: , Rfl:  .  alprazolam (XANAX) 2 MG tablet, Take 2 mg by mouth every 6 (six) hours as needed., Disp: , Rfl:  .  amoxicillin (AMOXIL) 500 MG capsule, Take 500 mg by mouth as directed. , Disp: , Rfl:  .  ARTIFICIAL TEARS 0.2-0.2-1 % SOLN, , Disp: , Rfl:  .  ASMANEX, 60 METERED DOSES, 220 MCG/INH inhaler, , Disp: , Rfl:  .  atenolol (TENORMIN) 25 MG tablet, Take 25 mg by mouth daily., Disp: , Rfl:  .  atorvastatin (LIPITOR) 80 MG tablet, Take 40 mg by mouth every evening. , Disp: , Rfl:  .  brimonidine (ALPHAGAN) 0.2 % ophthalmic solution, Place 1 drop into both eyes at bedtime., Disp: , Rfl:  .  buPROPion (WELLBUTRIN) 100 MG tablet, Take 100 mg by mouth 2 (two) times daily. , Disp: , Rfl:  .  capsaicin (ZOSTRIX) 0.025 % cream, , Disp: , Rfl:  .  carvedilol (COREG) 12.5 MG tablet, , Disp: , Rfl:  .  ciclopirox (LOPROX) 0.77 % cream, Apply to both feet and between toes bid x 28 days., Disp: 30 g, Rfl: 1 .  clonazePAM (KLONOPIN) 1 MG tablet, , Disp: , Rfl:  .  clonazePAM (KLONOPIN) 2 MG tablet, TAKE 1 TABLET BY MOUTH TWICE DAILY AND 2 TABLETS AT BEDTIME, Disp: , Rfl:  .  COMBIVENT RESPIMAT 20-100 MCG/ACT AERS respimat, , Disp: , Rfl:  .  cyclobenzaprine (FLEXERIL) 10 MG tablet, , Disp: , Rfl:  .  docusate sodium (COLACE) 100 MG capsule, Take 100 mg by mouth 2 (two) times daily., Disp: , Rfl:  .  etodolac (LODINE) 400 MG tablet, Take 400 mg by mouth daily., Disp: , Rfl:  .  furosemide (LASIX) 20 MG tablet, Take 20 mg by mouth daily., Disp: , Rfl:  .  gabapentin (NEURONTIN) 100 MG capsule, , Disp: , Rfl:  .  HYDROcodone-acetaminophen (NORCO/VICODIN) 5-325 MG per tablet, Take 1  tablet by mouth every 6 (six) hours as needed for moderate pain., Disp: , Rfl:  .  ibuprofen (ADVIL) 600 MG tablet, , Disp: , Rfl:  .  ketoconazole (NIZORAL) 2 % cream, Apply 1 application topically daily. Apply to both feet and between toes once daily for 6 weeks, Disp: 30 g, Rfl: 1 .  latanoprost (XALATAN) 0.005 % ophthalmic solution, Place 1 drop into both eyes at bedtime., Disp: , Rfl:  .  Menthol-Methyl Salicylate (THERA-GESIC) 1-15 % CREA, Apply topically 3 (three) times daily., Disp: , Rfl:  .  metFORMIN (GLUCOPHAGE) 1000 MG tablet, Take 1 tablet (1,000 mg total) by mouth 2 (two) times daily with a meal., Disp: , Rfl:  .  methadone (DOLOPHINE) 10 MG tablet, Take 10 mg by mouth 2 (two) times daily., Disp: , Rfl:  .  morphine (MSIR) 30 MG tablet, Take 30 mg by mouth as needed for severe pain., Disp: , Rfl:  .  NON FORMULARY, Antifungal solution for toenails sent to [facility], faxed 02/14/2019 [facility], Disp: , Rfl:  .  omeprazole (PRILOSEC) 20 MG capsule, Take 20 mg by mouth daily., Disp: , Rfl:  .  oxyCODONE (OXY IR/ROXICODONE) 5 MG immediate release tablet,  Take 1 tablet by mouth twice daily as needed for pain., Disp: , Rfl:  .  PERIOGARD 0.12 % solution, , Disp: , Rfl:  .  PRAMOSONE 1-2.5 % OINT, , Disp: , Rfl:  .  prazosin (MINIPRESS) 2 MG capsule, , Disp: , Rfl:  .  psyllium (METAMUCIL) 58.6 % powder, Take by mouth daily., Disp: , Rfl:  .  QUEtiapine (SEROQUEL) 200 MG tablet, Take 200 mg by mouth at bedtime., Disp: , Rfl:  .  tamsulosin (FLOMAX) 0.4 MG CAPS capsule, , Disp: , Rfl:  .  tiZANidine (ZANAFLEX) 4 MG tablet, TAKE ONE TABLET BY MOUTH THREE TIMES DAILY, Disp: 30 tablet, Rfl: 0 .  traZODone (DESYREL) 50 MG tablet, Take 400 mg by mouth at bedtime. Patient stated that he is to take 8 tablets at bedtime, Disp: , Rfl:  No current facility-administered medications for this visit.   Facility-Administered Medications Ordered in Other Visits:  .  chlorhexidine (HIBICLENS) 4 %  liquid, , Topical, UD, Nesi, Marc, MD   No Known Allergies   Objective: Neurovascular status unchanged from previous visit.  Dermatological Examination: Skin with normal turgor, texture and tone b/l.  No open wounds b/l.  No interdigital macerations noted b/l.  Mycotic toenails 1-5 b/l show evidence of recent debridement.  Hyperkeratotic lesions submet head 5 b/l. No erythema, no edema, no drainage, no flocculence.  Musculoskeletal Examination: Muscle strength 5/5 to all muscle groups b/l.  No pain, crepitus or joint discomfort with active/passive ROM.  Neurological Examination: Sensation absent b/l with 10 gram monofilament.  Vibratory sensation absent b/l.  Assessment: 1. Calluses submet head 5 b/l  Plan: 1.  Calluses pared submet head 5 b/l at no charge today. 2.  Continue soft, supportive shoe gear daily. 3.  Report any pedal injuries to medical professional. 4.  Keep scheduled 9 week visit. 5.  Patient/POA to call should there be a question/concern in there interim.

## 2019-04-24 ENCOUNTER — Ambulatory Visit (INDEPENDENT_AMBULATORY_CARE_PROVIDER_SITE_OTHER): Payer: Medicare Other | Admitting: Podiatry

## 2019-04-24 ENCOUNTER — Other Ambulatory Visit: Payer: Self-pay

## 2019-04-24 ENCOUNTER — Encounter: Payer: Self-pay | Admitting: Podiatry

## 2019-04-24 DIAGNOSIS — L84 Corns and callosities: Secondary | ICD-10-CM | POA: Diagnosis not present

## 2019-04-24 DIAGNOSIS — B351 Tinea unguium: Secondary | ICD-10-CM

## 2019-04-24 DIAGNOSIS — M79676 Pain in unspecified toe(s): Secondary | ICD-10-CM | POA: Diagnosis not present

## 2019-04-24 DIAGNOSIS — E1142 Type 2 diabetes mellitus with diabetic polyneuropathy: Secondary | ICD-10-CM

## 2019-04-24 NOTE — Patient Instructions (Signed)
Diabetes Mellitus and Foot Care Foot care is an important part of your health, especially when you have diabetes. Diabetes may cause you to have problems because of poor blood flow (circulation) to your feet and legs, which can cause your skin to:  Become thinner and drier.  Break more easily.  Heal more slowly.  Peel and crack. You may also have nerve damage (neuropathy) in your legs and feet, causing decreased feeling in them. This means that you may not notice minor injuries to your feet that could lead to more serious problems. Noticing and addressing any potential problems early is the best way to prevent future foot problems. How to care for your feet Foot hygiene  Wash your feet daily with warm water and mild soap. Do not use hot water. Then, pat your feet and the areas between your toes until they are completely dry. Do not soak your feet as this can dry your skin.  Trim your toenails straight across. Do not dig under them or around the cuticle. File the edges of your nails with an emery board or nail file.  Apply a moisturizing lotion or petroleum jelly to the skin on your feet and to dry, brittle toenails. Use lotion that does not contain alcohol and is unscented. Do not apply lotion between your toes. Shoes and socks  Wear clean socks or stockings every day. Make sure they are not too tight. Do not wear knee-high stockings since they may decrease blood flow to your legs.  Wear shoes that fit properly and have enough cushioning. Always look in your shoes before you put them on to be sure there are no objects inside.  To break in new shoes, wear them for just a few hours a day. This prevents injuries on your feet. Wounds, scrapes, corns, and calluses  Check your feet daily for blisters, cuts, bruises, sores, and redness. If you cannot see the bottom of your feet, use a mirror or ask someone for help.  Do not cut corns or calluses or try to remove them with medicine.  If you  find a minor scrape, cut, or break in the skin on your feet, keep it and the skin around it clean and dry. You may clean these areas with mild soap and water. Do not clean the area with peroxide, alcohol, or iodine.  If you have a wound, scrape, corn, or callus on your foot, look at it several times a day to make sure it is healing and not infected. Check for: ? Redness, swelling, or pain. ? Fluid or blood. ? Warmth. ? Pus or a bad smell. General instructions  Do not cross your legs. This may decrease blood flow to your feet.  Do not use heating pads or hot water bottles on your feet. They may burn your skin. If you have lost feeling in your feet or legs, you may not know this is happening until it is too late.  Protect your feet from hot and cold by wearing shoes, such as at the beach or on hot pavement.  Schedule a complete foot exam at least once a year (annually) or more often if you have foot problems. If you have foot problems, report any cuts, sores, or bruises to your health care provider immediately. Contact a health care provider if:  You have a medical condition that increases your risk of infection and you have any cuts, sores, or bruises on your feet.  You have an injury that is not   healing.  You have redness on your legs or feet.  You feel burning or tingling in your legs or feet.  You have pain or cramps in your legs and feet.  Your legs or feet are numb.  Your feet always feel cold.  You have pain around a toenail. Get help right away if:  You have a wound, scrape, corn, or callus on your foot and: ? You have pain, swelling, or redness that gets worse. ? You have fluid or blood coming from the wound, scrape, corn, or callus. ? Your wound, scrape, corn, or callus feels warm to the touch. ? You have pus or a bad smell coming from the wound, scrape, corn, or callus. ? You have a fever. ? You have a red line going up your leg. Summary  Check your feet every day  for cuts, sores, red spots, swelling, and blisters.  Moisturize feet and legs daily.  Wear shoes that fit properly and have enough cushioning.  If you have foot problems, report any cuts, sores, or bruises to your health care provider immediately.  Schedule a complete foot exam at least once a year (annually) or more often if you have foot problems. This information is not intended to replace advice given to you by your health care provider. Make sure you discuss any questions you have with your health care provider. Document Released: 07/09/2000 Document Revised: 08/24/2017 Document Reviewed: 08/13/2016 Elsevier Patient Education  2020 [employer]  Corns and Calluses Corns are small areas of thickened skin that occur on the top, sides, or tip of a toe. They contain a cone-shaped core with a point that can press on a nerve below. This causes pain.  Calluses are areas of thickened skin that can occur anywhere on the body, including the hands, fingers, palms, soles of the feet, and heels. Calluses are usually larger than corns. What are the causes? Corns and calluses are caused by rubbing (friction) or pressure, such as from shoes that are too tight or do not fit properly. What increases the risk? Corns are more likely to develop in people who have misshapen toes (toe deformities), such as hammer toes. Calluses can occur with friction to any area of the skin. They are more likely to develop in people who:  Work with their hands.  Wear shoes that fit poorly, are too tight, or are high-heeled.  Have toe deformities. What are the signs or symptoms? Symptoms of a corn or callus include:  A hard growth on the skin.  Pain or tenderness under the skin.  Redness and swelling.  Increased discomfort while wearing tight-fitting shoes, if your feet are affected. If a corn or callus becomes infected, symptoms may include:  Redness and swelling that gets worse.  Pain.  Fluid, blood, or  pus draining from the corn or callus. How is this diagnosed? Corns and calluses may be diagnosed based on your symptoms, your medical history, and a physical exam. How is this treated? Treatment for corns and calluses may include:  Removing the cause of the friction or pressure. This may involve: ? Changing your shoes. ? Wearing shoe inserts (orthotics) or other protective layers in your shoes, such as a corn pad. ? Wearing gloves.  Applying medicine to the skin (topical medicine) to help soften skin in the hardened, thickened areas.  Removing layers of dead skin with a file to reduce the size of the corn or callus.  Removing the corn or callus with a scalpel or   laser.  Taking antibiotic medicines, if your corn or callus is infected.  Having surgery, if a toe deformity is the cause. Follow these instructions at home:   Take over-the-counter and prescription medicines only as told by your health care provider.  If you were prescribed an antibiotic, take it as told by your health care provider. Do not stop taking it even if your condition starts to improve.  Wear shoes that fit well. Avoid wearing high-heeled shoes and shoes that are too tight or too loose.  Wear any padding, protective layers, gloves, or orthotics as told by your health care provider.  Soak your hands or feet and then use a file or pumice stone to soften your corn or callus. Do this as told by your health care provider.  Check your corn or callus every day for symptoms of infection. Contact a health care provider if you:  Notice that your symptoms do not improve with treatment.  Have redness or swelling that gets worse.  Notice that your corn or callus becomes painful.  Have fluid, blood, or pus coming from your corn or callus.  Have new symptoms. Summary  Corns are small areas of thickened skin that occur on the top, sides, or tip of a toe.  Calluses are areas of thickened skin that can occur anywhere  on the body, including the hands, fingers, palms, and soles of the feet. Calluses are usually larger than corns.  Corns and calluses are caused by rubbing (friction) or pressure, such as from shoes that are too tight or do not fit properly.  Treatment may include wearing any padding, protective layers, gloves, or orthotics as told by your health care provider. This information is not intended to replace advice given to you by your health care provider. Make sure you discuss any questions you have with your health care provider. Document Released: 04/17/2004 Document Revised: 11/01/2018 Document Reviewed: 05/25/2017 Elsevier Patient Education  2020 [employer]  Onychomycosis/Fungal Toenails  WHAT IS IT? An infection that lies within the keratin of your nail plate that is caused by a fungus.  WHY ME? Fungal infections affect all ages, sexes, races, and creeds.  There may be many factors that predispose you to a fungal infection such as age, coexisting medical conditions such as diabetes, or an autoimmune disease; stress, medications, fatigue, genetics, etc.  Bottom line: fungus thrives in a warm, moist environment and your shoes offer such a location.  IS IT CONTAGIOUS? Theoretically, yes.  You do not want to share shoes, nail clippers or files with someone who has fungal toenails.  Walking around barefoot in the same room or sleeping in the same bed is unlikely to transfer the organism.  It is important to realize, however, that fungus can spread easily from one nail to the next on the same foot.  HOW DO WE TREAT THIS?  There are several ways to treat this condition.  Treatment may depend on many factors such as age, medications, pregnancy, liver and kidney conditions, etc.  It is best to ask your doctor which options are available to you.  1. No treatment.   Unlike many other medical concerns, you can live with this condition.  However for many people this can be a painful condition and may  lead to ingrown toenails or a bacterial infection.  It is recommended that you keep the nails cut short to help reduce the amount of fungal nail. 2. Topical treatment.  These range from herbal remedies to prescription   strength nail lacquers.  About 40-50% effective, topicals require twice daily application for approximately 9 to 12 months or until an entirely new nail has grown out.  The most effective topicals are medical grade medications available through physicians offices. 3. Oral antifungal medications.  With an 80-90% cure rate, the most common oral medication requires 3 to 4 months of therapy and stays in your system for a year as the new nail grows out.  Oral antifungal medications do require blood work to make sure it is a safe drug for you.  A liver function panel will be performed prior to starting the medication and after the first month of treatment.  It is important to have the blood work performed to avoid any harmful side effects.  In general, this medication safe but blood work is required. 4. Laser Therapy.  This treatment is performed by applying a specialized laser to the affected nail plate.  This therapy is noninvasive, fast, and non-painful.  It is not covered by insurance and is therefore, out of pocket.  The results have been very good with a 80-95% cure rate.  [facility] is the only practice in the area to offer this therapy. 5. Permanent Nail Avulsion.  Removing the entire nail so that a new nail will not grow back. 

## 2019-04-25 NOTE — Progress Notes (Signed)
Subjective: Christopher Riley is seen today for follow up painful, elongated, thickened toenails 1-5 b/l feet that he cannot cut. Pain interferes with daily activities. Aggravating factor includes wearing enclosed shoe gear and relieved with periodic debridement.  Current Outpatient Medications on File Prior to Visit  Medication Sig  . acetaminophen (TYLENOL) 325 MG tablet   . ALBUTEROL IN Inhale 2 puffs into the lungs 4 (four) times daily as needed.   Marland Kitchen alprazolam (XANAX) 2 MG tablet Take 2 mg by mouth every 6 (six) hours as needed.  Marland Kitchen amoxicillin (AMOXIL) 500 MG capsule Take 500 mg by mouth as directed.   . ARTIFICIAL TEARS 0.2-0.2-1 % SOLN   . ASMANEX, 60 METERED DOSES, 220 MCG/INH inhaler   . atenolol (TENORMIN) 25 MG tablet Take 25 mg by mouth daily.  Marland Kitchen atorvastatin (LIPITOR) 80 MG tablet Take 40 mg by mouth every evening.   . brimonidine (ALPHAGAN) 0.2 % ophthalmic solution Place 1 drop into both eyes at bedtime.  Marland Kitchen buPROPion (WELLBUTRIN) 100 MG tablet Take 100 mg by mouth 2 (two) times daily.   . capsaicin (ZOSTRIX) 0.025 % cream   . carvedilol (COREG) 12.5 MG tablet   . ciclopirox (LOPROX) 0.77 % cream Apply to both feet and between toes bid x 28 days.  . clonazePAM (KLONOPIN) 1 MG tablet   . clonazePAM (KLONOPIN) 2 MG tablet TAKE 1 TABLET BY MOUTH TWICE DAILY AND 2 TABLETS AT BEDTIME  . COMBIVENT RESPIMAT 20-100 MCG/ACT AERS respimat   . cyclobenzaprine (FLEXERIL) 10 MG tablet   . docusate sodium (COLACE) 100 MG capsule Take 100 mg by mouth 2 (two) times daily.  Marland Kitchen etodolac (LODINE) 400 MG tablet Take 400 mg by mouth daily.  . fluconazole (DIFLUCAN) 200 MG tablet   . furosemide (LASIX) 20 MG tablet Take 20 mg by mouth daily.  Marland Kitchen gabapentin (NEURONTIN) 100 MG capsule   . HYDROcodone-acetaminophen (NORCO/VICODIN) 5-325 MG per tablet Take 1 tablet by mouth every 6 (six) hours as needed for moderate pain.  Marland Kitchen ibuprofen (ADVIL) 600 MG tablet   . ketoconazole (NIZORAL) 2 % cream Apply 1  application topically daily. Apply to both feet and between toes once daily for 6 weeks  . latanoprost (XALATAN) 0.005 % ophthalmic solution Place 1 drop into both eyes at bedtime.  . lidocaine (XYLOCAINE) 5 % ointment   . Menthol-Methyl Salicylate (THERA-GESIC) 1-15 % CREA Apply topically 3 (three) times daily.  . metFORMIN (GLUCOPHAGE) 1000 MG tablet Take 1 tablet (1,000 mg total) by mouth 2 (two) times daily with a meal.  . methadone (DOLOPHINE) 10 MG tablet Take 10 mg by mouth 2 (two) times daily.  Marland Kitchen morphine (MSIR) 30 MG tablet Take 30 mg by mouth as needed for severe pain.  . NON FORMULARY Antifungal solution for toenails sent to [facility], faxed 02/14/2019 [id number]  . omeprazole (PRILOSEC) 20 MG capsule Take 20 mg by mouth daily.  Marland Kitchen oxyCODONE (OXY IR/ROXICODONE) 5 MG immediate release tablet Take 1 tablet by mouth twice daily as needed for pain.  Marland Kitchen PERIOGARD 0.12 % solution   . PRAMOSONE 1-2.5 % OINT   . prazosin (MINIPRESS) 2 MG capsule   . psyllium (METAMUCIL) 58.6 % powder Take by mouth daily.  . QUEtiapine (SEROQUEL) 200 MG tablet Take 200 mg by mouth at bedtime.  . tamsulosin (FLOMAX) 0.4 MG CAPS capsule   . tiZANidine (ZANAFLEX) 4 MG tablet TAKE ONE TABLET BY MOUTH THREE TIMES DAILY  . traZODone (DESYREL) 50 MG tablet Take  400 mg by mouth at bedtime. Patient stated that he is to take 8 tablets at bedtime   Current Facility-Administered Medications on File Prior to Visit  Medication  . chlorhexidine (HIBICLENS) 4 % liquid     No Known Allergies   Objective:  Vascular Examination: Capillary refill time immediate x 10 digits.  Dorsalis pedis present b/l.  Posterior tibial pulses present b/l.  Digital hair absent x 10 digits.  Skin temperature gradient WNL b/l.   Dermatological Examination: Skin with normal turgor, texture and tone b/l  Toenails 1-5 b/l discolored, thick, dystrophic with subungual debris and pain with palpation to nailbeds due to thickness  of nails.  Hyperkeratotic lesion submet head 5 b/l  with tenderness to palpation. No edema, no erythema, no drainage, no flocculence.  Musculoskeletal: Muscle strength 5/5 b/l to all LE muscle groups.  No gross bony deformities b/l.  No pain, crepitus or joint limitation noted with ROM.   Neurological Examination: Protective sensation absent with 10 gram monofilament bilaterally.  Assessment: Painful onychomycosis toenails 1-5 b/l  Callus submet head 5 b/l NIDDM with neuropathy  Plan: 1. Toenails 1-5 b/l were debrided in length and girth without iatrogenic bleeding.  2. Calluses pared submetatarsal heads 5 b/l utilizing sterile scalpel blade without incident. 3. Patient to continue soft, supportive shoe gear daily. 4. Patient to report any pedal injuries to medical professional immediately. 5. Follow up 9 weeks. 6. Patient/POA to call should there be a concern in the interim.

## 2019-07-03 ENCOUNTER — Encounter: Payer: Self-pay | Admitting: Podiatry

## 2019-07-03 ENCOUNTER — Ambulatory Visit (INDEPENDENT_AMBULATORY_CARE_PROVIDER_SITE_OTHER): Payer: Medicare Other | Admitting: Podiatry

## 2019-07-03 ENCOUNTER — Other Ambulatory Visit: Payer: Self-pay

## 2019-07-03 DIAGNOSIS — B351 Tinea unguium: Secondary | ICD-10-CM

## 2019-07-03 DIAGNOSIS — M79676 Pain in unspecified toe(s): Secondary | ICD-10-CM

## 2019-07-03 DIAGNOSIS — E1142 Type 2 diabetes mellitus with diabetic polyneuropathy: Secondary | ICD-10-CM

## 2019-07-03 DIAGNOSIS — L84 Corns and callosities: Secondary | ICD-10-CM

## 2019-07-03 NOTE — Patient Instructions (Signed)
Diabetes Mellitus and Foot Care Foot care is an important part of your health, especially when you have diabetes. Diabetes may cause you to have problems because of poor blood flow (circulation) to your feet and legs, which can cause your skin to:  Become thinner and drier.  Break more easily.  Heal more slowly.  Peel and crack. You may also have nerve damage (neuropathy) in your legs and feet, causing decreased feeling in them. This means that you may not notice minor injuries to your feet that could lead to more serious problems. Noticing and addressing any potential problems early is the best way to prevent future foot problems. How to care for your feet Foot hygiene  Wash your feet daily with warm water and mild soap. Do not use hot water. Then, pat your feet and the areas between your toes until they are completely dry. Do not soak your feet as this can dry your skin.  Trim your toenails straight across. Do not dig under them or around the cuticle. File the edges of your nails with an emery board or nail file.  Apply a moisturizing lotion or petroleum jelly to the skin on your feet and to dry, brittle toenails. Use lotion that does not contain alcohol and is unscented. Do not apply lotion between your toes. Shoes and socks  Wear clean socks or stockings every day. Make sure they are not too tight. Do not wear knee-high stockings since they may decrease blood flow to your legs.  Wear shoes that fit properly and have enough cushioning. Always look in your shoes before you put them on to be sure there are no objects inside.  To break in new shoes, wear them for just a few hours a day. This prevents injuries on your feet. Wounds, scrapes, corns, and calluses  Check your feet daily for blisters, cuts, bruises, sores, and redness. If you cannot see the bottom of your feet, use a mirror or ask someone for help.  Do not cut corns or calluses or try to remove them with medicine.  If you  find a minor scrape, cut, or break in the skin on your feet, keep it and the skin around it clean and dry. You may clean these areas with mild soap and water. Do not clean the area with peroxide, alcohol, or iodine.  If you have a wound, scrape, corn, or callus on your foot, look at it several times a day to make sure it is healing and not infected. Check for: ? Redness, swelling, or pain. ? Fluid or blood. ? Warmth. ? Pus or a bad smell. General instructions  Do not cross your legs. This may decrease blood flow to your feet.  Do not use heating pads or hot water bottles on your feet. They may burn your skin. If you have lost feeling in your feet or legs, you may not know this is happening until it is too late.  Protect your feet from hot and cold by wearing shoes, such as at the beach or on hot pavement.  Schedule a complete foot exam at least once a year (annually) or more often if you have foot problems. If you have foot problems, report any cuts, sores, or bruises to your health care provider immediately. Contact a health care provider if:  You have a medical condition that increases your risk of infection and you have any cuts, sores, or bruises on your feet.  You have an injury that is not   healing.  You have redness on your legs or feet.  You feel burning or tingling in your legs or feet.  You have pain or cramps in your legs and feet.  Your legs or feet are numb.  Your feet always feel cold.  You have pain around a toenail. Get help right away if:  You have a wound, scrape, corn, or callus on your foot and: ? You have pain, swelling, or redness that gets worse. ? You have fluid or blood coming from the wound, scrape, corn, or callus. ? Your wound, scrape, corn, or callus feels warm to the touch. ? You have pus or a bad smell coming from the wound, scrape, corn, or callus. ? You have a fever. ? You have a red line going up your leg. Summary  Check your feet every day  for cuts, sores, red spots, swelling, and blisters.  Moisturize feet and legs daily.  Wear shoes that fit properly and have enough cushioning.  If you have foot problems, report any cuts, sores, or bruises to your health care provider immediately.  Schedule a complete foot exam at least once a year (annually) or more often if you have foot problems. This information is not intended to replace advice given to you by your health care provider. Make sure you discuss any questions you have with your health care provider. Document Released: 07/09/2000 Document Revised: 08/24/2017 Document Reviewed: 08/13/2016 Elsevier Patient Education  2020 [employer]  

## 2019-07-08 NOTE — Progress Notes (Signed)
Subjective: Christopher Riley is seen today for follow up painful calluses and tender elongated, thickened toenails bilateral feet that he cannot cut. Pain interferes with daily activities. Aggravating factor includes wearing enclosed shoe gear and relieved with periodic debridement.  Medications reviewed in chart.  No Known Allergies   Objective:  Vascular Examination: Capillary refill time to digits immediate b/l.  Dorsalis pedis and posterior tibial pulses present b/l.  Digital hair absent b/l.  Skin temperature gradient WNL b/l.   Dermatological Examination: Skin with normal turgor, texture and tone b/l.  No open wounds b/l.  Hyperkeratotic lesion submet head 5 b/l with tenderness to palpation. No edema, no erythema, no drainage, no flocculence.  Toenails 1-5 b/l discolored, thick, dystrophic with subungual debris and pain with palpation to nailbeds due to thickness of nails.  Musculoskeletal: Muscle strength 5/5 to all LE muscle groups b/l.  No gross bony deformities b/l.  No pain, crepitus or joint limitation noted with ROM.   Neurological Examination: Protective sensation diminished with 10 gram monofilament bilaterally.  Assessment: Painful onychomycosis toenails 1-5 b/l  Calluses submet head 5 b/l NIDDM with neuropathy  Plan: 1. Toenails 1-5 b/l were debrided in length and girth without iatrogenic bleeding. 2. Calluses pared submetatarsal head 5 b/l utilizing sterile scalpel blade without incident. 3. Patient to continue soft, supportive shoe gear daily. 4. Patient to report any pedal injuries to medical professional immediately. 5. Follow up 9 weeks. 6. Patient/POA to call should there be a concern in the interim.

## 2019-08-14 ENCOUNTER — Ambulatory Visit: Payer: Medicare Other | Admitting: Podiatry

## 2019-08-14 ENCOUNTER — Other Ambulatory Visit: Payer: Self-pay

## 2019-08-14 ENCOUNTER — Encounter: Payer: Self-pay | Admitting: Podiatry

## 2019-08-14 DIAGNOSIS — M79675 Pain in left toe(s): Secondary | ICD-10-CM | POA: Diagnosis not present

## 2019-08-14 DIAGNOSIS — E1142 Type 2 diabetes mellitus with diabetic polyneuropathy: Secondary | ICD-10-CM | POA: Diagnosis not present

## 2019-08-14 DIAGNOSIS — L84 Corns and callosities: Secondary | ICD-10-CM | POA: Diagnosis not present

## 2019-08-14 DIAGNOSIS — M79674 Pain in right toe(s): Secondary | ICD-10-CM | POA: Diagnosis not present

## 2019-08-14 DIAGNOSIS — B351 Tinea unguium: Secondary | ICD-10-CM | POA: Diagnosis not present

## 2019-08-19 NOTE — Progress Notes (Signed)
Subjective: Christopher Riley presents today for follow up of preventative diabetic foot care: painful calluses b/l feet and painful mycotic nails b/l that are difficult to trim. Pain interferes with ambulation. Aggravating factors include wearing enclosed shoe gear. Pain is relieved with periodic professional debridement.   No Known Allergies   Objective: There were no vitals filed for this visit.  Vascular Examination:  capillary refill time to digits immediate b/l, palpable DP pulses b/l, palpable PT pulses b/l, pedal hair absent b/l and skin temperature gradient within normal limits b/l  Dermatological Examination: Pedal skin with normal turgor, texture and tone bilaterally, no open wounds bilaterally, no interdigital macerations bilaterally, toenails 1-5 b/l elongated, dystrophic, thickened, crumbly with subungual debris and hyperkeratotic lesion(s) submet head 5 b/l.  No erythema, no edema, no drainage, no flocculence  Musculoskeletal: normal muscle strength 5/5 to all lower extremity muscle groups bilaterally, no gross bony deformities bilaterally and no pain crepitus or joint limitation noted with ROM b/l  Neurological: sensation absent with 10g monofilament  Assessment: 1. Pain due to onychomycosis of toenails of both feet   2. Callus   3. Diabetic peripheral neuropathy associated with type 2 diabetes mellitus ([place])     Plan: Continue diabetic foot care principles. Literature dispensed on today.  -Toenails 1-5 b/l were debrided in length and girth without iatrogenic bleeding. -The calluses were debrided without complication or incident. Total number debrided =2 submet head 5 b/l -Patient to continue soft, supportive shoe gear daily. -Patient to report any pedal injuries to medical professional immediately. -Patient/POA to call should there be question/concern in the interim.  Return in about 1 month (around 09/14/2019).

## 2019-09-11 ENCOUNTER — Encounter: Payer: Self-pay | Admitting: Podiatry

## 2019-09-11 ENCOUNTER — Ambulatory Visit (INDEPENDENT_AMBULATORY_CARE_PROVIDER_SITE_OTHER): Payer: Medicare Other | Admitting: Podiatry

## 2019-09-11 ENCOUNTER — Other Ambulatory Visit: Payer: Self-pay

## 2019-09-11 DIAGNOSIS — M79674 Pain in right toe(s): Secondary | ICD-10-CM | POA: Diagnosis not present

## 2019-09-11 DIAGNOSIS — B351 Tinea unguium: Secondary | ICD-10-CM | POA: Diagnosis not present

## 2019-09-11 DIAGNOSIS — E1142 Type 2 diabetes mellitus with diabetic polyneuropathy: Secondary | ICD-10-CM | POA: Diagnosis not present

## 2019-09-11 DIAGNOSIS — M79675 Pain in left toe(s): Secondary | ICD-10-CM

## 2019-09-11 DIAGNOSIS — L84 Corns and callosities: Secondary | ICD-10-CM | POA: Diagnosis not present

## 2019-09-11 NOTE — Patient Instructions (Signed)
Diabetes Mellitus and Foot Care Foot care is an important part of your health, especially when you have diabetes. Diabetes may cause you to have problems because of poor blood flow (circulation) to your feet and legs, which can cause your skin to:  Become thinner and drier.  Break more easily.  Heal more slowly.  Peel and crack. You may also have nerve damage (neuropathy) in your legs and feet, causing decreased feeling in them. This means that you may not notice minor injuries to your feet that could lead to more serious problems. Noticing and addressing any potential problems early is the best way to prevent future foot problems. How to care for your feet Foot hygiene  Wash your feet daily with warm water and mild soap. Do not use hot water. Then, pat your feet and the areas between your toes until they are completely dry. Do not soak your feet as this can dry your skin.  Trim your toenails straight across. Do not dig under them or around the cuticle. File the edges of your nails with an emery board or nail file.  Apply a moisturizing lotion or petroleum jelly to the skin on your feet and to dry, brittle toenails. Use lotion that does not contain alcohol and is unscented. Do not apply lotion between your toes. Shoes and socks  Wear clean socks or stockings every day. Make sure they are not too tight. Do not wear knee-high stockings since they may decrease blood flow to your legs.  Wear shoes that fit properly and have enough cushioning. Always look in your shoes before you put them on to be sure there are no objects inside.  To break in new shoes, wear them for just a few hours a day. This prevents injuries on your feet. Wounds, scrapes, corns, and calluses  Check your feet daily for blisters, cuts, bruises, sores, and redness. If you cannot see the bottom of your feet, use a mirror or ask someone for help.  Do not cut corns or calluses or try to remove them with medicine.  If you  find a minor scrape, cut, or break in the skin on your feet, keep it and the skin around it clean and dry. You may clean these areas with mild soap and water. Do not clean the area with peroxide, alcohol, or iodine.  If you have a wound, scrape, corn, or callus on your foot, look at it several times a day to make sure it is healing and not infected. Check for: ? Redness, swelling, or pain. ? Fluid or blood. ? Warmth. ? Pus or a bad smell. General instructions  Do not cross your legs. This may decrease blood flow to your feet.  Do not use heating pads or hot water bottles on your feet. They may burn your skin. If you have lost feeling in your feet or legs, you may not know this is happening until it is too late.  Protect your feet from hot and cold by wearing shoes, such as at the beach or on hot pavement.  Schedule a complete foot exam at least once a year (annually) or more often if you have foot problems. If you have foot problems, report any cuts, sores, or bruises to your health care provider immediately. Contact a health care provider if:  You have a medical condition that increases your risk of infection and you have any cuts, sores, or bruises on your feet.  You have an injury that is not   healing.  You have redness on your legs or feet.  You feel burning or tingling in your legs or feet.  You have pain or cramps in your legs and feet.  Your legs or feet are numb.  Your feet always feel cold.  You have pain around a toenail. Get help right away if:  You have a wound, scrape, corn, or callus on your foot and: ? You have pain, swelling, or redness that gets worse. ? You have fluid or blood coming from the wound, scrape, corn, or callus. ? Your wound, scrape, corn, or callus feels warm to the touch. ? You have pus or a bad smell coming from the wound, scrape, corn, or callus. ? You have a fever. ? You have a red line going up your leg. Summary  Check your feet every day  for cuts, sores, red spots, swelling, and blisters.  Moisturize feet and legs daily.  Wear shoes that fit properly and have enough cushioning.  If you have foot problems, report any cuts, sores, or bruises to your health care provider immediately.  Schedule a complete foot exam at least once a year (annually) or more often if you have foot problems. This information is not intended to replace advice given to you by your health care provider. Make sure you discuss any questions you have with your health care provider. Document Revised: 04/04/2019 Document Reviewed: 08/13/2016 Elsevier Patient Education  2020 [employer]  

## 2019-09-13 NOTE — Progress Notes (Signed)
Subjective: Christopher Riley presents today for follow up of preventative diabetic foot care and callus(es) b/l feet and painful mycotic toenails b/l that are difficult to trim. Pain interferes with ambulation. Aggravating factors include wearing enclosed shoe gear. Pain is relieved with periodic professional debridement..   No Known Allergies   Objective: There were no vitals filed for this visit.  Vascular Examination:  Capillary refill time to digits immediate b/l, palpable DP pulses b/l, palpable PT pulses b/l, pedal hair absent b/l, skin temperature gradient within normal limits b/l and trace edema noted b/l feet  Dermatological Examination: Pedal skin with normal turgor, texture and tone bilaterally, no open wounds bilaterally, no interdigital macerations bilaterally, toenails 1-5 b/l elongated, dystrophic, thickened, crumbly with subungual debris and porokeratotic lesion(s) submet head 5 b/l. No erythema, no edema, no drainage, no flocculence  Musculoskeletal: Normal muscle strength 5/5 to all lower extremity muscle groups bilaterally, no gross bony deformities bilaterally and no pain crepitus or joint limitation noted with ROM b/l  Neurological: Protective sensation absent with 10g monofilament b/l  Assessment: 1. Pain due to onychomycosis of toenails of both feet   2. Callus   3. Diabetic peripheral neuropathy associated with type 2 diabetes mellitus ([place])    Plan: -Continue diabetic foot care principles. Literature dispensed on today.  -Toenails 1-5 b/l were debrided in length and girth without iatrogenic bleeding. -Calluses were debrided without complication or incident. Total number debrided =2 submet head 5 b/l -Patient to continue soft, supportive shoe gear daily. -Patient to report any pedal injuries to medical professional immediately. -Patient/POA to call should there be question/concern in the interim.  Return in about 6 weeks (around 10/23/2019) for diabetic nail  and callus trim.

## 2019-11-13 ENCOUNTER — Ambulatory Visit: Payer: Medicare Other | Admitting: Podiatry

## 2019-11-21 ENCOUNTER — Ambulatory Visit: Payer: Medicare Other | Admitting: Podiatry

## 2019-12-11 ENCOUNTER — Other Ambulatory Visit: Payer: Self-pay

## 2019-12-11 ENCOUNTER — Ambulatory Visit: Payer: Medicare Other | Admitting: Podiatry

## 2019-12-11 ENCOUNTER — Encounter: Payer: Self-pay | Admitting: Podiatry

## 2019-12-11 VITALS — Temp 97.3°F

## 2019-12-11 DIAGNOSIS — M79675 Pain in left toe(s): Secondary | ICD-10-CM | POA: Diagnosis not present

## 2019-12-11 DIAGNOSIS — B351 Tinea unguium: Secondary | ICD-10-CM

## 2019-12-11 DIAGNOSIS — Q828 Other specified congenital malformations of skin: Secondary | ICD-10-CM | POA: Diagnosis not present

## 2019-12-11 DIAGNOSIS — M79674 Pain in right toe(s): Secondary | ICD-10-CM

## 2019-12-11 DIAGNOSIS — E1142 Type 2 diabetes mellitus with diabetic polyneuropathy: Secondary | ICD-10-CM

## 2019-12-11 NOTE — Patient Instructions (Signed)
Diabetes Mellitus and Foot Care Foot care is an important part of your health, especially when you have diabetes. Diabetes may cause you to have problems because of poor blood flow (circulation) to your feet and legs, which can cause your skin to:  Become thinner and drier.  Break more easily.  Heal more slowly.  Peel and crack. You may also have nerve damage (neuropathy) in your legs and feet, causing decreased feeling in them. This means that you may not notice minor injuries to your feet that could lead to more serious problems. Noticing and addressing any potential problems early is the best way to prevent future foot problems. How to care for your feet Foot hygiene  Wash your feet daily with warm water and mild soap. Do not use hot water. Then, pat your feet and the areas between your toes until they are completely dry. Do not soak your feet as this can dry your skin.  Trim your toenails straight across. Do not dig under them or around the cuticle. File the edges of your nails with an emery board or nail file.  Apply a moisturizing lotion or petroleum jelly to the skin on your feet and to dry, brittle toenails. Use lotion that does not contain alcohol and is unscented. Do not apply lotion between your toes. Shoes and socks  Wear clean socks or stockings every day. Make sure they are not too tight. Do not wear knee-high stockings since they may decrease blood flow to your legs.  Wear shoes that fit properly and have enough cushioning. Always look in your shoes before you put them on to be sure there are no objects inside.  To break in new shoes, wear them for just a few hours a day. This prevents injuries on your feet. Wounds, scrapes, corns, and calluses  Check your feet daily for blisters, cuts, bruises, sores, and redness. If you cannot see the bottom of your feet, use a mirror or ask someone for help.  Do not cut corns or calluses or try to remove them with medicine.  If you  find a minor scrape, cut, or break in the skin on your feet, keep it and the skin around it clean and dry. You may clean these areas with mild soap and water. Do not clean the area with peroxide, alcohol, or iodine.  If you have a wound, scrape, corn, or callus on your foot, look at it several times a day to make sure it is healing and not infected. Check for: ? Redness, swelling, or pain. ? Fluid or blood. ? Warmth. ? Pus or a bad smell. General instructions  Do not cross your legs. This may decrease blood flow to your feet.  Do not use heating pads or hot water bottles on your feet. They may burn your skin. If you have lost feeling in your feet or legs, you may not know this is happening until it is too late.  Protect your feet from hot and cold by wearing shoes, such as at the beach or on hot pavement.  Schedule a complete foot exam at least once a year (annually) or more often if you have foot problems. If you have foot problems, report any cuts, sores, or bruises to your health care provider immediately. Contact a health care provider if:  You have a medical condition that increases your risk of infection and you have any cuts, sores, or bruises on your feet.  You have an injury that is not   healing.  You have redness on your legs or feet.  You feel burning or tingling in your legs or feet.  You have pain or cramps in your legs and feet.  Your legs or feet are numb.  Your feet always feel cold.  You have pain around a toenail. Get help right away if:  You have a wound, scrape, corn, or callus on your foot and: ? You have pain, swelling, or redness that gets worse. ? You have fluid or blood coming from the wound, scrape, corn, or callus. ? Your wound, scrape, corn, or callus feels warm to the touch. ? You have pus or a bad smell coming from the wound, scrape, corn, or callus. ? You have a fever. ? You have a red line going up your leg. Summary  Check your feet every day  for cuts, sores, red spots, swelling, and blisters.  Moisturize feet and legs daily.  Wear shoes that fit properly and have enough cushioning.  If you have foot problems, report any cuts, sores, or bruises to your health care provider immediately.  Schedule a complete foot exam at least once a year (annually) or more often if you have foot problems. This information is not intended to replace advice given to you by your health care provider. Make sure you discuss any questions you have with your health care provider. Document Revised: 04/04/2019 Document Reviewed: 08/13/2016 Elsevier Patient Education  2020 [employer]  

## 2019-12-16 NOTE — Progress Notes (Signed)
Subjective: Christopher Riley presents today at risk foot care with history of diabetic neuropathy and painful porokeratotic lesion(s) plantar aspect left foot and painful mycotic toenails b/l that limit ambulation. Aggravating factors include weightbearing with and without shoe gear. Pain for both is relieved with periodic professional debridement.  He relates tenderness to callus plantar left foot. States he had to get a pedicure because of pain.   Past Medical History:  Diagnosis Date  . Arthritis    KNEES AND ANKLES  . BPH (benign prostatic hypertrophy)   . Chronic pain    back, knees  . Crutches as ambulation aid   . Depression   . Diabetic peripheral neuropathy ([place])   . Erectile dysfunction   . GERD (gastroesophageal reflux disease)   . Glaucoma    BILATERAL  . H/O agent Orange exposure   . History of adenomatous polyp of colon    2008  &  2010      . History of gastric ulcer   . History of lower GI bleeding    2011--  POST COLONOSCOPY W/ POLYPECTOMY/    AND SEVERAL TIMES SINCE SECONDARY TO HEMORRHOIDS  . History of prostate cancer    2009  ---S/P  EXTERNAL RADIATION THERAPY  . History of seizure    1980'S--  SECONDARY TO MEDICATION--  NO ISSUE SINCE  . Hyperlipidemia   . Hypertension   . Impaired hearing    BILATERAL  . Mild asthma   . Nocturia   . OSA (obstructive sleep apnea)    NON-COMPLIANT CPAP--  SLEEP STUDY 2013  at  [facility] in [place] traumatic stress disorder (PTSD)    HISTORY SUIDICE ATTEMPT AFTER [place]  . Scrotal edema   . Type 2 diabetes mellitus ([place])   . Urinary incontinence   . Vision loss of right eye   . Wears glasses      Current Outpatient Medications on File Prior to Visit  Medication Sig Dispense Refill  . acetaminophen (TYLENOL) 325 MG tablet     . ALBUTEROL IN Inhale 2 puffs into the lungs 4 (four) times daily as needed.     Marland Kitchen alprazolam (XANAX) 2 MG tablet Take 2 mg by mouth every 6 (six) hours as needed.    Marland Kitchen amoxicillin (AMOXIL)  500 MG capsule Take 500 mg by mouth as directed.     Harvin Hazel 4 % cream     . ARTIFICIAL TEARS 0.2-0.2-1 % SOLN     . ASMANEX, 60 METERED DOSES, 220 MCG/INH inhaler     . atenolol (TENORMIN) 25 MG tablet Take 25 mg by mouth daily.    Marland Kitchen atorvastatin (LIPITOR) 80 MG tablet Take 40 mg by mouth every evening.     . brimonidine (ALPHAGAN) 0.2 % ophthalmic solution Place 1 drop into both eyes at bedtime.    Marland Kitchen buPROPion (WELLBUTRIN) 100 MG tablet Take 100 mg by mouth 2 (two) times daily.     . capsaicin (ZOSTRIX) 0.025 % cream     . carvedilol (COREG) 12.5 MG tablet     . ciclopirox (LOPROX) 0.77 % cream Apply to both feet and between toes bid x 28 days. 30 g 1  . clonazePAM (KLONOPIN) 1 MG tablet     . clonazePAM (KLONOPIN) 2 MG tablet TAKE 1 TABLET BY MOUTH TWICE DAILY AND 2 TABLETS AT BEDTIME    . COMBIVENT RESPIMAT 20-100 MCG/ACT AERS respimat     . cyclobenzaprine (FLEXERIL) 10 MG tablet     .  diclofenac Sodium (VOLTAREN) 1 % GEL     . docusate sodium (COLACE) 100 MG capsule Take 100 mg by mouth 2 (two) times daily.    Marland Kitchen etodolac (LODINE) 400 MG tablet Take 400 mg by mouth daily.    . FEROSUL 325 (65 Fe) MG tablet     . fluconazole (DIFLUCAN) 200 MG tablet     . furosemide (LASIX) 20 MG tablet Take 20 mg by mouth daily.    Marland Kitchen gabapentin (NEURONTIN) 100 MG capsule     . HYDROcodone-acetaminophen (NORCO/VICODIN) 5-325 MG per tablet Take 1 tablet by mouth every 6 (six) hours as needed for moderate pain.    Marland Kitchen ibuprofen (ADVIL) 600 MG tablet     . ketoconazole (NIZORAL) 2 % cream Apply 1 application topically daily. Apply to both feet and between toes once daily for 6 weeks 30 g 1  . latanoprost (XALATAN) 0.005 % ophthalmic solution Place 1 drop into both eyes at bedtime.    . lidocaine (XYLOCAINE) 5 % ointment     . Menthol-Methyl Salicylate (THERA-GESIC) 1-15 % CREA Apply topically 3 (three) times daily.    . metFORMIN (GLUCOPHAGE) 1000 MG tablet Take 1 tablet (1,000 mg total) by mouth 2  (two) times daily with a meal.    . methadone (DOLOPHINE) 10 MG tablet Take 10 mg by mouth 2 (two) times daily.    Marland Kitchen morphine (MSIR) 30 MG tablet Take 30 mg by mouth as needed for severe pain.    . NON FORMULARY Antifungal solution for toenails sent to [facility], faxed 02/14/2019 [facility]    . omeprazole (PRILOSEC) 20 MG capsule Take 20 mg by mouth daily.    Marland Kitchen oxyCODONE (OXY IR/ROXICODONE) 5 MG immediate release tablet Take 1 tablet by mouth twice daily as needed for pain.    Marland Kitchen PERIOGARD 0.12 % solution     . PRAMOSONE 1-2.5 % OINT     . prazosin (MINIPRESS) 2 MG capsule     . psyllium (METAMUCIL) 58.6 % powder Take by mouth daily.    . QUEtiapine (SEROQUEL) 200 MG tablet Take 200 mg by mouth at bedtime.    . tamsulosin (FLOMAX) 0.4 MG CAPS capsule     . tiZANidine (ZANAFLEX) 4 MG tablet TAKE ONE TABLET BY MOUTH THREE TIMES DAILY 30 tablet 0  . traZODone (DESYREL) 50 MG tablet Take 400 mg by mouth at bedtime. Patient stated that he is to take 8 tablets at bedtime    . vitamin C (ASCORBIC ACID) 250 MG tablet      Current Facility-Administered Medications on File Prior to Visit  Medication Dose Route Frequency Provider Last Rate Last Admin  . chlorhexidine (HIBICLENS) 4 % liquid   Topical UD Lowella Bandy, MD         No Known Allergies  Objective: RUFFUS HOPP is a pleasant 69 y.o. y.o. Patient Race: Black or African American [2]  male in NAD. AAO x 3.  Vitals:   12/11/19 1057  Temp: (!) 97.3 F (36.3 C)  Vascular Examination: Neurovascular status unchanged b/l. Capillary refill time to digits immediate b/l. Palpable DP pulses b/l. Palpable PT pulses b/l. Pedal hair absent b/l Skin temperature gradient within normal limits b/l. Evidence of chronic venous insufficiency b/l LE. Trace edema noted b/l feet.  Dermatological Examination: Pedal skin with normal turgor, texture and tone bilaterally. No open wounds bilaterally. No interdigital macerations bilaterally. Toenails 1-5  b/l elongated, dystrophic, thickened, crumbly with subungual debris and tenderness to dorsal palpation.  Porokeratotic lesion(s) submet head 5 left foot and submet head 5 right foot. No erythema, no edema, no drainage, no flocculence.  Musculoskeletal: Normal muscle strength 5/5 to all lower extremity muscle groups bilaterally. No gross bony deformities bilaterally. No pain crepitus or joint limitation noted with ROM b/l. Utilizing crutches on today's visit.  Neurological Examination: Protective sensation diminished with 10g monofilament b/l. Babinski reflex negative b/l. Clonus negative b/l.  Assessment: 1. Pain due to onychomycosis of toenails of both feet   2. Porokeratosis   3. Diabetic peripheral neuropathy associated with type 2 diabetes mellitus ([place])   Plan: -Examined patient. -Informed him it's been over 3 months since his last visit and we need to get back on his every 9 week appointment. He related understanding. -No new findings. No new orders. -Continue diabetic foot care principles. Literature dispensed on today.  -Toenails 1-5 b/l were debrided in length and girth with sterile nail nippers and dremel without iatrogenic bleeding.  -Painful porokeratotic lesion(s) submet head 5 left foot and submet head 5 right foot pared and enucleated with sterile scalpel blade without incident. -Patient to continue soft, supportive shoe gear daily. -Patient to report any pedal injuries to medical professional immediately. -Patient/POA to call should there be question/concern in the interim.  Return in about 9 weeks (around 02/12/2020) for diabetic nail and callus trim.  Marzetta Board, DPM

## 2019-12-19 ENCOUNTER — Ambulatory Visit: Payer: Medicare Other | Admitting: Podiatry

## 2020-01-21 ENCOUNTER — Encounter: Payer: Self-pay | Admitting: Podiatry

## 2020-01-21 ENCOUNTER — Other Ambulatory Visit: Payer: Self-pay

## 2020-01-21 ENCOUNTER — Ambulatory Visit (INDEPENDENT_AMBULATORY_CARE_PROVIDER_SITE_OTHER): Payer: No Typology Code available for payment source | Admitting: Podiatry

## 2020-01-21 DIAGNOSIS — M79675 Pain in left toe(s): Secondary | ICD-10-CM | POA: Diagnosis not present

## 2020-01-21 DIAGNOSIS — Q828 Other specified congenital malformations of skin: Secondary | ICD-10-CM

## 2020-01-21 DIAGNOSIS — B351 Tinea unguium: Secondary | ICD-10-CM | POA: Diagnosis not present

## 2020-01-21 DIAGNOSIS — M79674 Pain in right toe(s): Secondary | ICD-10-CM | POA: Diagnosis not present

## 2020-01-21 DIAGNOSIS — E1142 Type 2 diabetes mellitus with diabetic polyneuropathy: Secondary | ICD-10-CM
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
## 2020-02-05 ENCOUNTER — Ambulatory Visit: Payer: Medicare Other | Admitting: Podiatry

## 2020-02-18 ENCOUNTER — Encounter: Payer: Self-pay | Admitting: Podiatry

## 2020-02-18 ENCOUNTER — Ambulatory Visit (INDEPENDENT_AMBULATORY_CARE_PROVIDER_SITE_OTHER): Payer: No Typology Code available for payment source | Admitting: Podiatry

## 2020-02-18 ENCOUNTER — Other Ambulatory Visit: Payer: Self-pay

## 2020-02-18 DIAGNOSIS — E1142 Type 2 diabetes mellitus with diabetic polyneuropathy: Secondary | ICD-10-CM

## 2020-02-18 DIAGNOSIS — M79674 Pain in right toe(s): Secondary | ICD-10-CM | POA: Diagnosis not present

## 2020-02-18 DIAGNOSIS — B351 Tinea unguium: Secondary | ICD-10-CM

## 2020-02-18 DIAGNOSIS — M79675 Pain in left toe(s): Secondary | ICD-10-CM

## 2020-02-18 DIAGNOSIS — Q828 Other specified congenital malformations of skin: Secondary | ICD-10-CM

## 2020-02-18 NOTE — Progress Notes (Signed)
  Subjective:  Patient ID: Christopher Riley, male    DOB: [birth year],  MRN: [id number]  Chief Complaint  Patient presents with  . Callouses    REQUESTING callouses shave down- and possible nail trim if needed    69 y.o. male presents with the above complaint. History confirmed with patient.  Callus has become painful again as well as the nails which are uncomfortable in shoe gear.  He is going to [facility] this weekend.  Objective:  Physical Exam: warm, good capillary refill, no trophic changes or ulcerative lesions, normal DP and PT pulses and reduced sensation at plantar pulps of toes distally. Left Foot: Onychomycosis 1-5, porokeratosis submet 5 Right Foot: Onychomycosis 1-5    Assessment:   1. Pain due to onychomycosis of toenails of both feet   2. Porokeratosis   3. Diabetic peripheral neuropathy associated with type 2 diabetes mellitus ([place])      Plan:  Patient was evaluated and treated and all questions answered.  Patient educated on diabetes. Discussed proper diabetic foot care and discussed risks and complications of disease. Educated patient in depth on reasons to return to the office immediately should he/she discover anything concerning or new on the feet. All questions answered. Discussed proper shoes as well.    Discussed the etiology and treatment options for the condition in detail with the patient. Educated patient on the topical and oral treatment options for mycotic nails. Recommended debridement of the nails today. Sharp and mechanical debridement performed of all painful and mycotic nails today. Nails debrided in length and thickness using a nail nipper and a mechanical burr to level of comfort. Discussed treatment options including appropriate shoe gear. Follow up as needed for painful nails.    All symptomatic hyperkeratoses were safely debrided with a sterile #15 blade to patient's level of comfort without incident. We discussed preventative and palliative  care of these lesions including supportive and accommodative shoegear, padding, prefabricated and custom molded accommodative orthoses, use of a pumice stone and lotions/creams daily.  Lanae Crumbly, DPM 02/18/2020      No follow-ups on file.

## 2020-03-03 ENCOUNTER — Ambulatory Visit: Payer: No Typology Code available for payment source | Admitting: Podiatry

## 2020-03-11 ENCOUNTER — Ambulatory Visit (INDEPENDENT_AMBULATORY_CARE_PROVIDER_SITE_OTHER): Payer: No Typology Code available for payment source | Admitting: Podiatry

## 2020-03-11 ENCOUNTER — Encounter: Payer: Self-pay | Admitting: Podiatry

## 2020-03-11 ENCOUNTER — Other Ambulatory Visit: Payer: Self-pay

## 2020-03-11 DIAGNOSIS — M2030 Hallux varus (acquired), unspecified foot: Secondary | ICD-10-CM

## 2020-03-11 DIAGNOSIS — M79672 Pain in left foot: Secondary | ICD-10-CM | POA: Diagnosis not present

## 2020-03-11 DIAGNOSIS — E1142 Type 2 diabetes mellitus with diabetic polyneuropathy: Secondary | ICD-10-CM | POA: Diagnosis not present

## 2020-03-11 DIAGNOSIS — Q828 Other specified congenital malformations of skin: Secondary | ICD-10-CM

## 2020-03-11 NOTE — Patient Instructions (Signed)
Christopher Riley,  Bring in your diabetic shoes from the [facility]
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
## 2020-04-04 ENCOUNTER — Encounter: Payer: Self-pay | Admitting: Podiatry

## 2020-04-04 ENCOUNTER — Ambulatory Visit (INDEPENDENT_AMBULATORY_CARE_PROVIDER_SITE_OTHER): Payer: No Typology Code available for payment source | Admitting: Podiatry

## 2020-04-04 ENCOUNTER — Other Ambulatory Visit: Payer: Self-pay

## 2020-04-04 DIAGNOSIS — M2041 Other hammer toe(s) (acquired), right foot: Secondary | ICD-10-CM | POA: Diagnosis not present

## 2020-04-04 DIAGNOSIS — M2042 Other hammer toe(s) (acquired), left foot: Secondary | ICD-10-CM

## 2020-04-04 DIAGNOSIS — M2030 Hallux varus (acquired), unspecified foot: Secondary | ICD-10-CM

## 2020-04-04 NOTE — Progress Notes (Signed)
Subjective:   Patient ID: Christopher Riley, male   DOB: 69 y.o.   MRN: [id number]   HPI Patient presents concerned about lesions on both big toe stating they are getting sore and the fifth metatarsal left also concerned about his hammertoe deformity neuro   ROS      Objective:  Physical Exam  Vascular status intact with patient found to have significant digital structural abnormalities with patient also currently in knee braces and moderate obesity which certainly complicates his factor     Assessment:  Digital deformities bilateral with the left being worse than the right with plantarflexed metatarsal and keratotic lesion formation     Plan:  H&P reviewed conditions.  We discussed the possibility for other treatments but at this point were just can go with conservative with debridement and padding and unfortunately this will probably need to be done instead of consideration of digital fusions or other more invasive type procedures.  Reappoint as needed

## 2020-04-08 ENCOUNTER — Other Ambulatory Visit: Payer: No Typology Code available for payment source | Admitting: Orthotics

## 2020-04-09 ENCOUNTER — Ambulatory Visit: Payer: No Typology Code available for payment source | Admitting: Podiatry

## 2020-04-14 ENCOUNTER — Other Ambulatory Visit: Payer: Self-pay

## 2020-04-14 ENCOUNTER — Ambulatory Visit (INDEPENDENT_AMBULATORY_CARE_PROVIDER_SITE_OTHER): Payer: No Typology Code available for payment source | Admitting: Podiatry

## 2020-04-14 ENCOUNTER — Encounter: Payer: Self-pay | Admitting: Podiatry

## 2020-04-14 DIAGNOSIS — Q828 Other specified congenital malformations of skin: Secondary | ICD-10-CM

## 2020-04-14 DIAGNOSIS — E1142 Type 2 diabetes mellitus with diabetic polyneuropathy: Secondary | ICD-10-CM | POA: Diagnosis not present
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
## 2020-04-27 ENCOUNTER — Emergency Department (HOSPITAL_COMMUNITY): Payer: No Typology Code available for payment source

## 2020-04-27 ENCOUNTER — Emergency Department (HOSPITAL_COMMUNITY)
Admission: EM | Admit: 2020-04-27 | Discharge: 2020-04-27 | Disposition: A | Discharge status: Home / Self Care | Payer: No Typology Code available for payment source | Attending: Emergency Medicine | Admitting: Emergency Medicine

## 2020-04-27 ENCOUNTER — Other Ambulatory Visit: Payer: Self-pay

## 2020-04-27 ENCOUNTER — Encounter (HOSPITAL_COMMUNITY): Payer: Self-pay

## 2020-04-27 DIAGNOSIS — G8929 Other chronic pain: Secondary | ICD-10-CM | POA: Insufficient documentation

## 2020-04-27 DIAGNOSIS — S72401A Unspecified fracture of lower end of right femur, initial encounter for closed fracture: Secondary | ICD-10-CM

## 2020-04-27 DIAGNOSIS — I10 Essential (primary) hypertension: Secondary | ICD-10-CM | POA: Insufficient documentation

## 2020-04-27 DIAGNOSIS — R109 Unspecified abdominal pain: Secondary | ICD-10-CM | POA: Diagnosis not present

## 2020-04-27 DIAGNOSIS — Z743 Need for continuous supervision: Secondary | ICD-10-CM | POA: Diagnosis not present

## 2020-04-27 DIAGNOSIS — S8991XA Unspecified injury of right lower leg, initial encounter: Secondary | ICD-10-CM | POA: Diagnosis present

## 2020-04-27 DIAGNOSIS — Z7984 Long term (current) use of oral hypoglycemic drugs: Secondary | ICD-10-CM | POA: Insufficient documentation

## 2020-04-27 DIAGNOSIS — M549 Dorsalgia, unspecified: Secondary | ICD-10-CM | POA: Diagnosis not present

## 2020-04-27 DIAGNOSIS — E1140 Type 2 diabetes mellitus with diabetic neuropathy, unspecified: Secondary | ICD-10-CM | POA: Diagnosis not present

## 2020-04-27 DIAGNOSIS — Z96651 Presence of right artificial knee joint: Secondary | ICD-10-CM | POA: Insufficient documentation

## 2020-04-27 DIAGNOSIS — J45909 Unspecified asthma, uncomplicated: Secondary | ICD-10-CM | POA: Diagnosis not present

## 2020-04-27 DIAGNOSIS — W19XXXA Unspecified fall, initial encounter: Secondary | ICD-10-CM | POA: Insufficient documentation

## 2020-04-27 DIAGNOSIS — R079 Chest pain, unspecified: Secondary | ICD-10-CM | POA: Diagnosis not present

## 2020-04-27 DIAGNOSIS — R0789 Other chest pain: Secondary | ICD-10-CM | POA: Diagnosis not present

## 2020-04-27 DIAGNOSIS — Z79899 Other long term (current) drug therapy: Secondary | ICD-10-CM | POA: Insufficient documentation

## 2020-04-27 DIAGNOSIS — S72402A Unspecified fracture of lower end of left femur, initial encounter for closed fracture: Secondary | ICD-10-CM | POA: Diagnosis not present

## 2020-04-27 DIAGNOSIS — Z7951 Long term (current) use of inhaled steroids: Secondary | ICD-10-CM | POA: Insufficient documentation

## 2020-04-27 DIAGNOSIS — G4489 Other headache syndrome: Secondary | ICD-10-CM | POA: Diagnosis not present

## 2020-04-27 MED ORDER — HYDROMORPHONE HCL 1 MG/ML IJ SOLN
1.0000 mg | Freq: Once | INTRAMUSCULAR | Status: AC
  Administered 2020-04-27: 1 mg via INTRAVENOUS

## 2020-04-27 MED ORDER — HYDROMORPHONE HCL 1 MG/ML IJ SOLN
1.0000 mg | Freq: Once | INTRAMUSCULAR | Status: DC
  Filled 2020-04-27: qty 1

## 2020-04-27 MED ORDER — DIAZEPAM 5 MG PO TABS: 10.0000 mg | ORAL_TABLET | Freq: Once | ORAL | Status: DC

## 2020-04-27 MED ORDER — DIAZEPAM 5 MG PO TABS
5.0000 mg | ORAL_TABLET | Freq: Once | ORAL | Status: AC
  Administered 2020-04-27: 5 mg via ORAL
  Filled 2020-04-27: qty 1
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
## 2020-04-27 NOTE — ED Triage Notes (Addendum)
BIB EMS. Pt c/o falls. Pt fell at 6am called 911 for lift assist but refused transport to ED. Pt fell again 30 minutes prior to ems arrival. Pt c/o bilateral knee, back and right ankle pain. Pt walks with crutches at home. Pt took 4 hydrocodene at 6am but is only prescribed 2 per day. Vitals wnl. Denies LOC. Denies blood thinners

## 2020-04-27 NOTE — ED Notes (Signed)
Pt place on primofit at 60 mmHg. Urine sample collected at bedside waiting per order.

## 2020-04-27 NOTE — Discharge Instructions (Addendum)
You have a nondisplaced fracture of your right distal femur around her prosthesis.  [facility]

## 2020-04-27 NOTE — ED Notes (Signed)
Pt states he has a knee brace and does not want a new one

## 2020-05-09 ENCOUNTER — Ambulatory Visit: Payer: No Typology Code available for payment source | Admitting: Podiatry

## 2020-05-30 ENCOUNTER — Encounter: Payer: Self-pay | Admitting: Podiatry

## 2020-05-30 ENCOUNTER — Other Ambulatory Visit: Payer: Self-pay

## 2020-05-30 ENCOUNTER — Ambulatory Visit (INDEPENDENT_AMBULATORY_CARE_PROVIDER_SITE_OTHER): Payer: No Typology Code available for payment source | Admitting: Podiatry

## 2020-05-30 DIAGNOSIS — M79675 Pain in left toe(s): Secondary | ICD-10-CM | POA: Diagnosis not present

## 2020-05-30 DIAGNOSIS — B351 Tinea unguium: Secondary | ICD-10-CM | POA: Diagnosis not present

## 2020-05-30 DIAGNOSIS — Q828 Other specified congenital malformations of skin: Secondary | ICD-10-CM | POA: Diagnosis not present

## 2020-05-30 DIAGNOSIS — E1142 Type 2 diabetes mellitus with diabetic polyneuropathy: Secondary | ICD-10-CM | POA: Diagnosis not present

## 2020-05-30 DIAGNOSIS — M79674 Pain in right toe(s): Secondary | ICD-10-CM

## 2020-06-02 ENCOUNTER — Other Ambulatory Visit: Payer: Self-pay

## 2020-06-02 ENCOUNTER — Ambulatory Visit (INDEPENDENT_AMBULATORY_CARE_PROVIDER_SITE_OTHER): Payer: Medicare Other | Admitting: Podiatry

## 2020-06-02 ENCOUNTER — Ambulatory Visit: Payer: No Typology Code available for payment source | Admitting: Podiatry

## 2020-06-02 ENCOUNTER — Encounter: Payer: Self-pay | Admitting: Podiatry

## 2020-06-02 DIAGNOSIS — S90121A Contusion of right lesser toe(s) without damage to nail, initial encounter: Secondary | ICD-10-CM | POA: Diagnosis not present

## 2020-06-02 NOTE — Progress Notes (Signed)
Subjective:   Patient ID: Christopher Riley, male   DOB: 69 y.o.   MRN: [id number]   HPI Patient states that he has had a little bit of bleeding on his third toe right foot and he wanted to get it checked.  Patient had a small amount of tissue that was not part of the nail that had become irritated after the nail debridement of the last week   ROS      Objective:  Physical Exam  Neurovascular status found to be unchanged with patient found to have on the distal lateral aspect of the third digit a small area of irritation.  It is localized there is no proximal edema erythema or drainage noted and there is no current bleeding of the area      Assessment:  Small contusion of the distal lateral third digit right     Plan:  H&P reviewed condition.  At this time I did clean up I did not note any pathology associated with it and is precautionary measure I did place a small amount of Neosporin with Band-Aid on the area and I instructed Christopher Riley on daily inspections of this.  He will also soak and I like him as best as possible to wear open toed shoes but I do not think this will be any issue for him and I gave him strict instructions to come in if anything does not seem right.  I also gave him some Neosporin to use at home as needed and instructed him on usage

## 2020-06-02 NOTE — Progress Notes (Signed)
Subjective:   Patient ID: Christopher Riley, male   DOB: 69 y.o.   MRN: [id number]   HPI Patient states his nails are thickened ingrown in the corners and he cannot cut them.  States he needs his nails taken care of and they get irritated with shoe gear and also has lesions he needs to have cut   ROS      Objective:  Physical Exam  Neurovascular status unchanged with thick incurvated nailbeds 1-5 both feet that are dystrophic and moderately painful when pressed.  Patient is found to have good digital perfusion well oriented x3 and has lesion formation bilateral hallux     Assessment:  Painful mycotic nail infection 1-5 both feet with lesion formation hallux bilateral     Plan:  Debridement of nailbeds 1-5 both feet accomplished slight bleeding lateral side third digit right I put a small amount of Neosporin on it with Band-Aid and debridement of lesions bilateral.  Instructed to come back in if any issues were to occur and patient will be seen back for routine care

## 2020-06-17 ENCOUNTER — Ambulatory Visit: Payer: No Typology Code available for payment source | Admitting: Podiatry

## 2020-08-07 ENCOUNTER — Other Ambulatory Visit: Payer: Self-pay

## 2020-08-07 ENCOUNTER — Ambulatory Visit: Payer: No Typology Code available for payment source | Attending: *Deleted | Admitting: Physical Therapy

## 2020-08-07 DIAGNOSIS — M25561 Pain in right knee: Secondary | ICD-10-CM | POA: Insufficient documentation

## 2020-08-07 DIAGNOSIS — R2689 Other abnormalities of gait and mobility: Secondary | ICD-10-CM | POA: Insufficient documentation

## 2020-08-07 DIAGNOSIS — G8929 Other chronic pain: Secondary | ICD-10-CM | POA: Diagnosis present

## 2020-08-07 DIAGNOSIS — M6281 Muscle weakness (generalized): Secondary | ICD-10-CM | POA: Insufficient documentation

## 2020-08-07 NOTE — Patient Instructions (Addendum)
Aquatic Therapy: What to Expect! ° °Where:  °[facility]           NOTE: You will receive an automated phone message °[address]           reminding you of your appointment and it will say the  °[place], [place]  [address]           appointment is at the [address] clinic.  We are  °[phone]             working to fix this - just know that you will meet us at  °              pool! ° ° °How to Prepare: °• Please make sure you drink 8 ounces of water about one hour prior to your pool session °• A caregiver may attend if needed with the patient to help assist as needed. A caregiver can sit in the bleachers next to the pool. °• Please arrive IN YOUR SUIT and 15 minutes prior to your appointment - this helps to avoid delays in starting your session. °• Please make sure to attend to any toileting needs prior to entering the pool °• Locker rooms for changing are located to the right of the check -in desk.  There is direct access to the pool deck form the locker room.  You can lock your belongings in a locker but you must bring you own lock. °• Once on the pool deck your therapist will ask you to sign the Patient  Consent and Assignment of Benefits form °• Your therapist may take your blood pressure prior to, during and after your session if indicated °• We usually try and create a home exercise program based on activities we do in the pool.  Please be thinking about who might be able to assist you in the pool should you need to participate in an aquatic home exercise program at the time of discharge if you need assistance.  Some patients do not want to or do not have the ability to participate in an aquatic home program - this is not a barrier in any way to you participating in aquatic therapy as part of your current therapy plan! °• After Discharge from PT, you can continue using home program at  the [facility], there is a drop-in fee for $5 ($45 a month)or for 60 years   or older $4.00 ($40 a month for seniors ) ° ° ° °About the pool: °1. Entering the pool °Your therapist will assist you; there are multiple ways to enter including stairs with railings, a walk in ramp, a roll in chair and a mechanical lift. Your therapist will determine the most appropriate way for you. °2. Water temperature is usually between 86-87 degrees °3. There may be other swimmers in the pool at the same time °4. There is availability at the pool for ° ° ° ° °Contact Info:             Appointments: Please call the [facility] °[facility]         Center if you need to cancel or reschedule an appointment. °[address]             [phone]   °[place], [place]            All sessions are 45 minutes      [phone]       °     ° ° ° ° ° °       ° °
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
## 2020-08-22 ENCOUNTER — Ambulatory Visit: Payer: No Typology Code available for payment source

## 2020-08-26 ENCOUNTER — Ambulatory Visit: Payer: No Typology Code available for payment source

## 2020-08-27 ENCOUNTER — Ambulatory Visit: Payer: No Typology Code available for payment source | Admitting: Podiatry

## 2020-08-27 ENCOUNTER — Ambulatory Visit: Payer: No Typology Code available for payment source | Admitting: Physical Therapy

## 2020-08-27 ENCOUNTER — Other Ambulatory Visit: Payer: Self-pay

## 2020-08-27 DIAGNOSIS — M6281 Muscle weakness (generalized): Secondary | ICD-10-CM | POA: Insufficient documentation

## 2020-08-27 DIAGNOSIS — R2689 Other abnormalities of gait and mobility: Secondary | ICD-10-CM | POA: Insufficient documentation

## 2020-08-27 DIAGNOSIS — G8929 Other chronic pain: Secondary | ICD-10-CM | POA: Insufficient documentation

## 2020-08-27 DIAGNOSIS — M25561 Pain in right knee: Secondary | ICD-10-CM | POA: Insufficient documentation
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
## 2020-08-29 ENCOUNTER — Other Ambulatory Visit: Payer: Self-pay

## 2020-08-29 ENCOUNTER — Ambulatory Visit: Payer: No Typology Code available for payment source | Attending: *Deleted | Admitting: Physical Therapy

## 2020-08-29 DIAGNOSIS — G8929 Other chronic pain: Secondary | ICD-10-CM | POA: Diagnosis present

## 2020-08-29 DIAGNOSIS — R2689 Other abnormalities of gait and mobility: Secondary | ICD-10-CM

## 2020-08-29 DIAGNOSIS — M25561 Pain in right knee: Secondary | ICD-10-CM

## 2020-08-29 DIAGNOSIS — M6281 Muscle weakness (generalized): Secondary | ICD-10-CM | POA: Diagnosis present
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
## 2020-09-03 ENCOUNTER — Ambulatory Visit: Payer: No Typology Code available for payment source | Admitting: Physical Therapy

## 2020-09-05 ENCOUNTER — Encounter: Payer: Self-pay | Admitting: Physical Therapy

## 2020-09-05 ENCOUNTER — Other Ambulatory Visit: Payer: Self-pay

## 2020-09-05 ENCOUNTER — Ambulatory Visit: Payer: No Typology Code available for payment source | Admitting: Physical Therapy

## 2020-09-05 DIAGNOSIS — M6281 Muscle weakness (generalized): Secondary | ICD-10-CM

## 2020-09-05 DIAGNOSIS — M25561 Pain in right knee: Secondary | ICD-10-CM | POA: Diagnosis not present

## 2020-09-05 DIAGNOSIS — R2689 Other abnormalities of gait and mobility: Secondary | ICD-10-CM

## 2020-09-05 DIAGNOSIS — G8929 Other chronic pain: Secondary | ICD-10-CM

## 2020-09-05 NOTE — Patient Instructions (Signed)
Access Code: [email or web address]: [email or web address]: 02/11/2022Prepared by: Anderson Malta PaaExercises  Seated Hamstring Stretch - 2 x daily - 7 x weekly - 1 sets - 5 reps - 30 hold  Seated Long Arc Quad - 2 x daily - 7 x weekly - 3 sets - 10 reps - 5 hold  Seated Calf Stretch with Strap - 2 x daily - 7 x weekly - 1 sets - 5 reps - 30 hold  Sit to Stand with Counter Support - 2 x daily - 7 x weekly - 2 sets - 10 reps - 5 hold  Standing Hip Abduction with Counter Support - 2 x daily - 7 x weekly - 2 sets - 10 reps - 5 hold  Standing Heel Raise with Support - 2 x daily - 7 x weekly - 2 sets - 10 reps - 5 hold
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
## 2020-09-08 ENCOUNTER — Emergency Department (HOSPITAL_COMMUNITY)
Admission: EM | Admit: 2020-09-08 | Discharge: 2020-09-08 | Disposition: A | Discharge status: Home / Self Care | Payer: No Typology Code available for payment source | Attending: Emergency Medicine | Admitting: Emergency Medicine

## 2020-09-08 ENCOUNTER — Ambulatory Visit: Payer: No Typology Code available for payment source | Admitting: Physical Therapy

## 2020-09-08 ENCOUNTER — Encounter (HOSPITAL_COMMUNITY): Payer: Self-pay | Admitting: Emergency Medicine

## 2020-09-08 DIAGNOSIS — H5461 Unqualified visual loss, right eye, normal vision left eye: Secondary | ICD-10-CM | POA: Insufficient documentation

## 2020-09-08 DIAGNOSIS — Z96651 Presence of right artificial knee joint: Secondary | ICD-10-CM | POA: Diagnosis not present

## 2020-09-08 DIAGNOSIS — E1142 Type 2 diabetes mellitus with diabetic polyneuropathy: Secondary | ICD-10-CM | POA: Insufficient documentation

## 2020-09-08 DIAGNOSIS — Z79899 Other long term (current) drug therapy: Secondary | ICD-10-CM | POA: Diagnosis not present

## 2020-09-08 DIAGNOSIS — R195 Other fecal abnormalities: Secondary | ICD-10-CM | POA: Diagnosis present

## 2020-09-08 DIAGNOSIS — R0602 Shortness of breath: Secondary | ICD-10-CM | POA: Insufficient documentation

## 2020-09-08 DIAGNOSIS — I10 Essential (primary) hypertension: Secondary | ICD-10-CM | POA: Diagnosis not present

## 2020-09-08 DIAGNOSIS — K921 Melena: Secondary | ICD-10-CM | POA: Diagnosis not present

## 2020-09-08 DIAGNOSIS — Z7984 Long term (current) use of oral hypoglycemic drugs: Secondary | ICD-10-CM | POA: Insufficient documentation

## 2020-09-08 DIAGNOSIS — J45909 Unspecified asthma, uncomplicated: Secondary | ICD-10-CM | POA: Insufficient documentation

## 2020-09-08 LAB — CBC
HCT: 42.6 % (ref 39.0–52.0)
Hemoglobin: 13.3 g/dL (ref 13.0–17.0)
MCH: 26.3 pg (ref 26.0–34.0)
MCHC: 31.2 g/dL (ref 30.0–36.0)
MCV: 84.4 fL (ref 80.0–100.0)
Platelets: 241 10*3/uL (ref 150–400)
RBC: 5.05 MIL/uL (ref 4.22–5.81)
RDW: 14.6 % (ref 11.5–15.5)
WBC: 6.6 10*3/uL (ref 4.0–10.5)
nRBC: 0 % (ref 0.0–0.2)

## 2020-09-08 LAB — TYPE AND SCREEN
ABO/RH(D): O POS
Antibody Screen: NEGATIVE

## 2020-09-08 LAB — COMPREHENSIVE METABOLIC PANEL
ALT: 20 U/L (ref 0–44)
AST: 19 U/L (ref 15–41)
Albumin: 3.8 g/dL (ref 3.5–5.0)
Alkaline Phosphatase: 58 U/L (ref 38–126)
Anion gap: 6 (ref 5–15)
BUN: 15 mg/dL (ref 8–23)
CO2: 32 mmol/L (ref 22–32)
Calcium: 8.8 mg/dL — ABNORMAL LOW (ref 8.9–10.3)
Chloride: 103 mmol/L (ref 98–111)
Creatinine, Ser: 0.78 mg/dL (ref 0.61–1.24)
GFR, Estimated: >60 mL/min (ref >60)
Glucose, Bld: 101 mg/dL — ABNORMAL HIGH (ref 70–99)
Potassium: 4.8 mmol/L (ref 3.5–5.1)
Sodium: 141 mmol/L (ref 135–145)
Total Bilirubin: 0.5 mg/dL (ref 0.3–1.2)
Total Protein: 7 g/dL (ref 6.5–8.1)

## 2020-09-08 LAB — POC OCCULT BLOOD, ED: Fecal Occult Bld: NEGATIVE

## 2020-09-08 NOTE — ED Triage Notes (Signed)
Per pt, states he has been having blood in stool on and off for 2-3 weeks-states it is now dark in color-states slight abdominal discomfort-[facility] told [facility]
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
## 2020-09-08 NOTE — Discharge Instructions (Addendum)
Your workup was reassuring today. Your stool tested negative for blood. Your stool may be darker in color due to foods you have eaten recently as well as Peptobismal.   Please follow up with the [facility] regarding your ED visit today and for recheck in 1-2 weeks. It does appear that you were supposed to have a repeat colonoscopy 6 months after your one in 2018 due to inadequate prep however this was never done - speak with them/[facility] regarding this to schedule another one for further evaluation.    Return to the ED IMMEDIATELY for any worsening symptoms including abdominal pain, vomiting including bright red blood/what appears to look like ground up coffee beans, passing out, severe chest pain, or any other new/concerning symptoms.

## 2020-09-10 ENCOUNTER — Ambulatory Visit: Payer: No Typology Code available for payment source | Admitting: Physical Therapy

## 2020-09-12 ENCOUNTER — Ambulatory Visit: Payer: No Typology Code available for payment source | Admitting: Physical Therapy

## 2020-09-12 ENCOUNTER — Other Ambulatory Visit: Payer: Self-pay

## 2020-09-12 ENCOUNTER — Ambulatory Visit: Payer: No Typology Code available for payment source | Admitting: Podiatry

## 2020-09-12 ENCOUNTER — Emergency Department (HOSPITAL_COMMUNITY)
Admission: EM | Admit: 2020-09-12 | Discharge: 2020-09-12 | Disposition: A | Discharge status: Home / Self Care | Payer: No Typology Code available for payment source | Attending: Emergency Medicine | Admitting: Emergency Medicine

## 2020-09-12 DIAGNOSIS — J45909 Unspecified asthma, uncomplicated: Secondary | ICD-10-CM | POA: Insufficient documentation

## 2020-09-12 DIAGNOSIS — Z79899 Other long term (current) drug therapy: Secondary | ICD-10-CM | POA: Diagnosis not present

## 2020-09-12 DIAGNOSIS — Z8546 Personal history of malignant neoplasm of prostate: Secondary | ICD-10-CM | POA: Diagnosis not present

## 2020-09-12 DIAGNOSIS — E1140 Type 2 diabetes mellitus with diabetic neuropathy, unspecified: Secondary | ICD-10-CM | POA: Diagnosis not present

## 2020-09-12 DIAGNOSIS — Z7984 Long term (current) use of oral hypoglycemic drugs: Secondary | ICD-10-CM | POA: Insufficient documentation

## 2020-09-12 DIAGNOSIS — Z96651 Presence of right artificial knee joint: Secondary | ICD-10-CM | POA: Insufficient documentation

## 2020-09-12 DIAGNOSIS — I10 Essential (primary) hypertension: Secondary | ICD-10-CM | POA: Insufficient documentation

## 2020-09-12 NOTE — ED Triage Notes (Signed)
Pt came in with c/o HTN at home. Pt states that his systolic BP was over 466 at home. Pt current pressure is 159/87. Pt states he has been dizzy for a few days and has some SOB that started four days ago. Pt denies chest pain. Pt normally seen at [facility].
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
## 2020-09-15 ENCOUNTER — Encounter: Payer: No Typology Code available for payment source | Admitting: Physical Therapy

## 2020-09-17 ENCOUNTER — Ambulatory Visit: Payer: No Typology Code available for payment source | Admitting: Physical Therapy

## 2020-09-19 ENCOUNTER — Ambulatory Visit: Payer: No Typology Code available for payment source | Admitting: Physical Therapy

## 2020-09-22 ENCOUNTER — Encounter: Payer: No Typology Code available for payment source | Admitting: Physical Therapy

## 2020-09-26 ENCOUNTER — Encounter: Payer: No Typology Code available for payment source | Admitting: Physical Therapy

## 2020-09-29 ENCOUNTER — Ambulatory Visit (INDEPENDENT_AMBULATORY_CARE_PROVIDER_SITE_OTHER): Payer: Medicare Other | Admitting: Podiatry

## 2020-09-29 ENCOUNTER — Encounter: Payer: No Typology Code available for payment source | Admitting: Physical Therapy

## 2020-09-29 ENCOUNTER — Other Ambulatory Visit: Payer: Self-pay

## 2020-09-29 DIAGNOSIS — E1142 Type 2 diabetes mellitus with diabetic polyneuropathy: Secondary | ICD-10-CM | POA: Diagnosis not present

## 2020-09-29 DIAGNOSIS — M79675 Pain in left toe(s): Secondary | ICD-10-CM | POA: Diagnosis not present

## 2020-09-29 DIAGNOSIS — M2041 Other hammer toe(s) (acquired), right foot: Secondary | ICD-10-CM

## 2020-09-29 DIAGNOSIS — B351 Tinea unguium: Secondary | ICD-10-CM

## 2020-09-29 DIAGNOSIS — M2042 Other hammer toe(s) (acquired), left foot: Secondary | ICD-10-CM

## 2020-09-29 DIAGNOSIS — M79674 Pain in right toe(s): Secondary | ICD-10-CM

## 2020-09-29 DIAGNOSIS — Q828 Other specified congenital malformations of skin: Secondary | ICD-10-CM

## 2020-10-03 ENCOUNTER — Encounter: Payer: No Typology Code available for payment source | Admitting: Physical Therapy

## 2020-10-05 ENCOUNTER — Encounter: Payer: Self-pay | Admitting: Podiatry

## 2020-10-05 NOTE — Progress Notes (Signed)
  Subjective:  Patient ID: Christopher Riley, male    DOB: April 11, 1951,  MRN: [id number]  70 y.o. male presents with at risk foot care with history of diabetic neuropathy and painful porokeratotic lesions plantar left foot.  Pain prevent comfortable ambulation. Aggravating factor is weightbearing with or without shoegear.   He is seen through the [facility].   He is interested in diabetic shoes, but receives his care through the [facility].  Review of Systems: Negative except as noted in the HPI. Denies N/V/F/Ch.  Allergies  Allergen Reactions  . Other Other (See Comments)    Non steroidal anti inflammatory analgesic - GI bleed   Social History   Tobacco Use  Smoking Status Never Smoker  Smokeless Tobacco Never Used     Objective:  There were no vitals filed for this visit. There is no height or weight on file to calculate BMI. Constitutional Patient is a pleasant 70 y.o. African American male, morbidly obese in NAD.  Vascular Dorsalis pedis pulses palpable bilaterally. Posterior tibial pulses palpable bilaterally. Capillary refill normal to all digits. No cyanosis or clubbing noted.Pedal hair growth absent b/l LE. Trace edema noted b/l LE.   Neurologic Normal speech. Oriented to person, place, and time. Protective sensation diminished with 10g monofilament b/l. Clonus negative b/l.  Dermatologic Pedal skin is thin shiny, atrophic b/l lower extremities. No open wounds bilaterally. No interdigital macerations bilaterally. Mycotic toenails 1-5 b/l recently debrided. Porokeratotic lesion(s) submet head 5 left foot, plantar aspect of distal tip of b/l hallux. There is tenderness to palpation. No erythema, no edema, no drainage, no flocculence. Evidence of chronic venous insufficiency b/l LE. Skin changes consistent with venous stasis noted b/l LE. Hyperpigmentation consistent with findings of chronic venous insufficiency is present  b/l LE.  Orthopedic: Normal muscle strength 5/5 to all lower extremity muscle groups bilaterally. No pain crepitus or joint limitation noted with ROM b/l. Hammertoes noted to the L hallux and R hallux. Utilizes rollator for ambulation assistance.    Radiographs: None Assessment:   1. Pain due to onychomycosis of toenails of both feet   2. Porokeratosis   3. Hammer toes of both feet   4. Diabetic peripheral neuropathy associated with type 2 diabetes mellitus ([place])    Plan:  -Patient was evaluated and treated and all questions answered. -Examined patient. -Continue diabetic foot care principles. -Toenails 1-5 b/l were debrided in length and girth with sterile nail nippers and dremel without iatrogenic bleeding.  -Painful porokeratotic lesion(s) L hallux, R hallux, submet head 5 left foot and submet head 5 right foot pared and enucleated with sterile scalpel blade without incident. -Patient to report any pedal injuries to medical professional immediately. -Patient/POA to call should there be question/concern in the interim.  Return in about 3 months (around 12/30/2020).  Marzetta Board, DPM

## 2020-10-06 ENCOUNTER — Encounter: Payer: No Typology Code available for payment source | Admitting: Physical Therapy

## 2020-10-06 ENCOUNTER — Telehealth: Payer: Self-pay | Admitting: Podiatry

## 2020-10-06 NOTE — Telephone Encounter (Signed)
Avaletta has it at the front desk. I will tell her to mail it. Thank you!

## 2020-10-06 NOTE — Telephone Encounter (Signed)
Patient requesting prescription (just the paperwork) for diabetic shoes to be mailed to him. Please advise.

## 2020-10-07 NOTE — Telephone Encounter (Signed)
Thank you :)

## 2020-10-10 ENCOUNTER — Encounter: Payer: No Typology Code available for payment source | Admitting: Physical Therapy

## 2020-11-03 ENCOUNTER — Emergency Department (HOSPITAL_COMMUNITY): Payer: No Typology Code available for payment source

## 2020-11-03 ENCOUNTER — Other Ambulatory Visit: Payer: Self-pay

## 2020-11-03 ENCOUNTER — Encounter (HOSPITAL_COMMUNITY): Payer: Self-pay

## 2020-11-03 ENCOUNTER — Emergency Department (HOSPITAL_COMMUNITY)
Admission: EM | Admit: 2020-11-03 | Discharge: 2020-11-03 | Disposition: A | Discharge status: Home / Self Care | Payer: No Typology Code available for payment source | Attending: Emergency Medicine | Admitting: Emergency Medicine

## 2020-11-03 DIAGNOSIS — M25561 Pain in right knee: Secondary | ICD-10-CM | POA: Insufficient documentation

## 2020-11-03 DIAGNOSIS — Z96651 Presence of right artificial knee joint: Secondary | ICD-10-CM | POA: Insufficient documentation

## 2020-11-03 DIAGNOSIS — Z79899 Other long term (current) drug therapy: Secondary | ICD-10-CM | POA: Diagnosis not present

## 2020-11-03 DIAGNOSIS — Y92410 Unspecified street and highway as the place of occurrence of the external cause: Secondary | ICD-10-CM | POA: Diagnosis not present

## 2020-11-03 DIAGNOSIS — Z8546 Personal history of malignant neoplasm of prostate: Secondary | ICD-10-CM | POA: Diagnosis not present

## 2020-11-03 DIAGNOSIS — I10 Essential (primary) hypertension: Secondary | ICD-10-CM | POA: Insufficient documentation

## 2020-11-03 DIAGNOSIS — M542 Cervicalgia: Secondary | ICD-10-CM | POA: Insufficient documentation

## 2020-11-03 DIAGNOSIS — E1142 Type 2 diabetes mellitus with diabetic polyneuropathy: Secondary | ICD-10-CM | POA: Diagnosis not present

## 2020-11-03 DIAGNOSIS — M545 Low back pain, unspecified: Secondary | ICD-10-CM | POA: Diagnosis not present

## 2020-11-03 DIAGNOSIS — J45909 Unspecified asthma, uncomplicated: Secondary | ICD-10-CM | POA: Diagnosis not present
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
## 2020-11-05 ENCOUNTER — Emergency Department (HOSPITAL_COMMUNITY)
Admission: EM | Admit: 2020-11-05 | Discharge: 2020-11-05 | Disposition: A | Discharge status: Home / Self Care | Payer: No Typology Code available for payment source | Attending: Emergency Medicine | Admitting: Emergency Medicine

## 2020-11-05 ENCOUNTER — Encounter (HOSPITAL_COMMUNITY): Payer: Self-pay | Admitting: Emergency Medicine

## 2020-11-05 ENCOUNTER — Other Ambulatory Visit: Payer: Self-pay

## 2020-11-05 DIAGNOSIS — M545 Low back pain, unspecified: Secondary | ICD-10-CM

## 2020-11-05 DIAGNOSIS — E1142 Type 2 diabetes mellitus with diabetic polyneuropathy: Secondary | ICD-10-CM | POA: Diagnosis not present

## 2020-11-05 DIAGNOSIS — J45909 Unspecified asthma, uncomplicated: Secondary | ICD-10-CM | POA: Insufficient documentation

## 2020-11-05 DIAGNOSIS — Z8546 Personal history of malignant neoplasm of prostate: Secondary | ICD-10-CM | POA: Diagnosis not present

## 2020-11-05 DIAGNOSIS — Z96651 Presence of right artificial knee joint: Secondary | ICD-10-CM | POA: Diagnosis not present

## 2020-11-05 DIAGNOSIS — I10 Essential (primary) hypertension: Secondary | ICD-10-CM | POA: Diagnosis not present

## 2020-11-05 DIAGNOSIS — Y92410 Unspecified street and highway as the place of occurrence of the external cause: Secondary | ICD-10-CM | POA: Insufficient documentation

## 2020-11-05 DIAGNOSIS — Z79899 Other long term (current) drug therapy: Secondary | ICD-10-CM | POA: Insufficient documentation

## 2020-11-05 DIAGNOSIS — Z923 Personal history of irradiation: Secondary | ICD-10-CM | POA: Insufficient documentation

## 2020-11-05 MED ORDER — KETOROLAC TROMETHAMINE 15 MG/ML IJ SOLN
15.0000 mg | Freq: Once | INTRAMUSCULAR | Status: AC
  Administered 2020-11-05: 15 mg via INTRAMUSCULAR
  Filled 2020-11-05: qty 1

## 2020-11-05 MED ORDER — DEXAMETHASONE SODIUM PHOSPHATE 4 MG/ML IJ SOLN
2.0000 mg | Freq: Once | INTRAMUSCULAR | Status: AC
  Administered 2020-11-05: 2 mg via INTRAMUSCULAR
  Filled 2020-11-05: qty 1

## 2020-11-05 NOTE — ED Notes (Signed)
An After Visit Summary was printed and given to the patient. Discharge instructions given and no further questions at this time.  
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
## 2020-11-05 NOTE — Discharge Instructions (Addendum)
Please return for any problem.  °

## 2020-11-06 ENCOUNTER — Encounter (HOSPITAL_COMMUNITY): Payer: Self-pay

## 2020-11-06 ENCOUNTER — Emergency Department (HOSPITAL_COMMUNITY)
Admission: EM | Admit: 2020-11-06 | Discharge: 2020-11-06 | Disposition: A | Discharge status: Home / Self Care | Payer: No Typology Code available for payment source | Attending: Emergency Medicine | Admitting: Emergency Medicine

## 2020-11-06 DIAGNOSIS — I10 Essential (primary) hypertension: Secondary | ICD-10-CM | POA: Insufficient documentation

## 2020-11-06 DIAGNOSIS — J45909 Unspecified asthma, uncomplicated: Secondary | ICD-10-CM | POA: Insufficient documentation

## 2020-11-06 DIAGNOSIS — Z96651 Presence of right artificial knee joint: Secondary | ICD-10-CM | POA: Insufficient documentation

## 2020-11-06 DIAGNOSIS — M545 Low back pain, unspecified: Secondary | ICD-10-CM | POA: Insufficient documentation

## 2020-11-06 DIAGNOSIS — M549 Dorsalgia, unspecified: Secondary | ICD-10-CM | POA: Diagnosis present

## 2020-11-06 DIAGNOSIS — G8929 Other chronic pain: Secondary | ICD-10-CM | POA: Insufficient documentation

## 2020-11-06 DIAGNOSIS — Z79899 Other long term (current) drug therapy: Secondary | ICD-10-CM | POA: Diagnosis not present

## 2020-11-06 DIAGNOSIS — E1140 Type 2 diabetes mellitus with diabetic neuropathy, unspecified: Secondary | ICD-10-CM | POA: Insufficient documentation

## 2020-11-06 DIAGNOSIS — Z7984 Long term (current) use of oral hypoglycemic drugs: Secondary | ICD-10-CM | POA: Diagnosis not present

## 2020-11-06 MED ORDER — NAPROXEN 500 MG PO TABS: 500.0000 mg | ORAL_TABLET | Freq: Two times a day (BID) | ORAL | 0 refills | Status: DC

## 2020-11-06 MED ORDER — KETOROLAC TROMETHAMINE 60 MG/2ML IM SOLN
60.0000 mg | Freq: Once | INTRAMUSCULAR | Status: AC
  Administered 2020-11-06: 60 mg via INTRAMUSCULAR
  Filled 2020-11-06: qty 2

## 2020-11-06 MED ORDER — METHYLPREDNISOLONE 4 MG PO TBPK: ORAL_TABLET | ORAL | 0 refills | Status: DC

## 2020-11-06 MED ORDER — DEXAMETHASONE SODIUM PHOSPHATE 10 MG/ML IJ SOLN
10.0000 mg | Freq: Once | INTRAMUSCULAR | Status: AC
  Administered 2020-11-06: 10 mg via INTRAMUSCULAR
  Filled 2020-11-06: qty 1

## 2020-11-06 NOTE — Discharge Instructions (Signed)
You were seen in the emergency department for continued back pain  I have reviewed your chart, you recently filled hydrocodone.  Take this medicine as prescribed.  I have prescribed 500 mg of naproxen that you can take in the morning and in the evening with meals.  Take 10 mg of cyclobenzaprine at nighttime, caution with this medicine as it can cause drowsiness and put you at risk for falls.  Take steroid taper which can help with inflammation as well.  Call orthopedic clinic and make an appointment for reevaluation.  They may consider doing an MRI  Return to the ED for worsening or severe back pain despite medicines, fever, chills, burning with urination, blood in your urine, difficulty or changes with bladder or bowel control, extremity weakness or numbness
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
## 2020-11-17 ENCOUNTER — Other Ambulatory Visit: Payer: Self-pay

## 2020-11-17 ENCOUNTER — Encounter (HOSPITAL_COMMUNITY): Payer: Self-pay | Admitting: *Deleted

## 2020-11-17 ENCOUNTER — Emergency Department (HOSPITAL_COMMUNITY)
Admission: EM | Admit: 2020-11-17 | Discharge: 2020-11-17 | Disposition: A | Discharge status: Home / Self Care | Payer: No Typology Code available for payment source | Attending: Emergency Medicine | Admitting: Emergency Medicine

## 2020-11-17 DIAGNOSIS — I10 Essential (primary) hypertension: Secondary | ICD-10-CM | POA: Diagnosis not present

## 2020-11-17 DIAGNOSIS — G8929 Other chronic pain: Secondary | ICD-10-CM | POA: Insufficient documentation

## 2020-11-17 DIAGNOSIS — E1140 Type 2 diabetes mellitus with diabetic neuropathy, unspecified: Secondary | ICD-10-CM | POA: Diagnosis not present

## 2020-11-17 DIAGNOSIS — Z7984 Long term (current) use of oral hypoglycemic drugs: Secondary | ICD-10-CM | POA: Diagnosis not present

## 2020-11-17 DIAGNOSIS — J45909 Unspecified asthma, uncomplicated: Secondary | ICD-10-CM | POA: Insufficient documentation

## 2020-11-17 DIAGNOSIS — Z96651 Presence of right artificial knee joint: Secondary | ICD-10-CM | POA: Insufficient documentation

## 2020-11-17 DIAGNOSIS — Z79899 Other long term (current) drug therapy: Secondary | ICD-10-CM | POA: Diagnosis not present

## 2020-11-17 DIAGNOSIS — M545 Low back pain, unspecified: Secondary | ICD-10-CM | POA: Insufficient documentation

## 2020-11-17 MED ORDER — KETOROLAC TROMETHAMINE 15 MG/ML IJ SOLN
15.0000 mg | Freq: Once | INTRAMUSCULAR | Status: AC
  Administered 2020-11-17: 15 mg via INTRAMUSCULAR
  Filled 2020-11-17: qty 1

## 2020-11-17 MED ORDER — DEXAMETHASONE SODIUM PHOSPHATE 4 MG/ML IJ SOLN
4.0000 mg | Freq: Once | INTRAMUSCULAR | Status: AC
  Administered 2020-11-17: 4 mg via INTRAMUSCULAR
  Filled 2020-11-17: qty 1

## 2020-11-17 NOTE — ED Provider Notes (Signed)
[facility] DEPT Provider Note   CSN: [id number] Arrival date & time: 11/17/20  4970     History Chief Complaint  Patient presents with  . Back Pain    ETAI Christopher Riley is a 70 y.o. male.  HPI Patient is a 70 year old male with an extensive medical history as noted below.  He presents the emergency department due to bilateral low back pain.  States he has chronic pain in the region and is followed by [facility].  Feels that it recently worsened and comes to the emergency department requesting pain medications.  He states he has been evaluated earlier in the month given Toradol as well as dexamethasone and states that this significantly alleviated his symptoms.  Denies any history of diabetes mellitus.  No anticoagulation.  Denies any chest pain, shortness of breath, urinary complaints, numbness, tingling, weakness.  No new falls or trauma.    Past Medical History:  Diagnosis Date  . Arthritis    KNEES AND ANKLES  . BPH (benign prostatic hypertrophy)   . Chronic pain    back, knees  . Crutches as ambulation aid   . Depression   . Diabetic peripheral neuropathy ([place])   . Erectile dysfunction   . GERD (gastroesophageal reflux disease)   . Glaucoma    BILATERAL  . H/O agent Orange exposure   . History of adenomatous polyp of colon    2008  &  2010      . History of gastric ulcer   . History of lower GI bleeding    2011--  POST COLONOSCOPY W/ POLYPECTOMY/    AND SEVERAL TIMES SINCE SECONDARY TO HEMORRHOIDS  . History of prostate cancer    2009  ---S/P  EXTERNAL RADIATION THERAPY  . History of seizure    1980'S--  SECONDARY TO MEDICATION--  NO ISSUE SINCE  . Hyperlipidemia   . Hypertension   . Impaired hearing    BILATERAL  . Mild asthma   . Nocturia   . OSA (obstructive sleep apnea)    NON-COMPLIANT CPAP--  SLEEP STUDY 2013  at  [facility] in [place] traumatic stress disorder (PTSD)    HISTORY SUIDICE ATTEMPT AFTER [place]  . Scrotal  edema   . Type 2 diabetes mellitus ([place])   . Urinary incontinence   . Vision loss of right eye   . Wears glasses     Patient Active Problem List   Diagnosis Date Noted  . Intertrigo 08/03/2016  . History of nephrolithiasis 02/18/2016  . Scrotal swelling 06/27/2013  . Chest pain 09/27/2012  . Fatigue 08/15/2012  . Neuropathy 03/22/2011  . WEAKNESS 06/30/2010  . ANKLE PAIN, RIGHT 04/02/2010  . CONSTIPATION 08/28/2009  . ANEMIA, SECONDARY TO CHRONIC BLOOD LOSS 08/21/2009  . HEMORRHOIDS, WITH BLEEDING 07/28/2009  . NECK PAIN 02/19/2009  . HYPERTENSION 01/16/2009  . SOMNOLENCE 01/16/2009  . Obstructive sleep apnea 01/16/2009  . SHOULDER PAIN, RIGHT 07/12/2008  . DIABETES MELLITUS, TYPE II 05/28/2008  . HYPERLIPIDEMIA 03/22/2008  . LOW BACK PAIN, CHRONIC 03/22/2008  . ADENOCARCINOMA, PROSTATE 03/12/2008  . OTHER ACUTE REACTIONS TO STRESS 03/12/2008  . MEMORY LOSS 03/12/2008  . PARESTHESIA 03/12/2008    Past Surgical History:  Procedure Laterality Date  . CATARACT EXTRACTION    . CLOSED MANIPULATION RIGHT SHOULDER  09-29-2009  . COLONOSCOPY W/ HEMORRHOID SURGERY  [facility]RIL 2015   [facility] [place]  . HEMORROIDECTOMY  12-25-2009  . OPEN LEFT KNEE PATELLA SURGERY  ,  [facility]  [place] N/A 01/09/2013   Procedure: PENILE PROSTHESIS ;  Surgeon: Hanley Ben, MD;  Location: [facility];  Service: Urology;  Laterality: N/A;  Inflatable Penile Prosthesis Placement  (Coloplast) INFRAPUBIC APPROACH  . RETINAL DETACHMENT SURGERY Right 2006  . SCROTECTOMY N/A 12/28/2013   Procedure: SCROTOPLASTY;  Surgeon: Arvil Persons, MD;  Location[facility];  Service: Urology;  Laterality: N/A;  . SHOULDER ARTHROSCOPY DEBRIDEMENT PARTIAL ROTATOR CUFF AND LABRAL TEAR/  SAD/  DISTAL CLAVICLE RESECTION Right 07-01-2009  . TOTAL KNEE ARTHROPLASTY Right 1998  . TRANSURETHRAL RESECTION OF PROSTATE  11-05-2005       Family History  Problem Relation Age of Onset  . Diabetes  Brother   . Other Other        no FH of colon cancer    Social History   Tobacco Use  . Smoking status: Never Smoker  . Smokeless tobacco: Never Used  Substance Use Topics  . Alcohol use: No    Comment: none since 1988  . Drug use: Yes    Comment: speed in [place] but never since    Home Medications Prior to Admission medications   Medication Sig Start Date End Date Taking? Authorizing Provider  albuterol (VENTOLIN HFA) 108 (90 Base) MCG/ACT inhaler Inhale 2 puffs into the lungs every 6 (six) hours as needed for wheezing or shortness of breath.    [provider]  ASMANEX, 60 METERED DOSES, 220 MCG/INH inhaler Inhale 1 puff into the lungs daily as needed (for wheezing or shortness of breath).  01/22/19   [provider]  brimonidine (ALPHAGAN) 0.2 % ophthalmic solution Place 1 drop into both eyes at bedtime.    [provider]  capsaicin (ZOSTRIX) 0.025 % cream Apply 1 application topically daily. 08/10/18   [provider]  carvedilol (COREG) 12.5 MG tablet TAKE ONE TABLET BY MOUTH TWO TIMES A DAY FOR BLOOD PRESSURE 09/12/20 09/13/21  [provider]  carvedilol (COREG) 12.5 MG tablet Take 12.5 mg by mouth 2 (two) times daily. 09/18/20   [provider]  ciclopirox (LOPROX) 0.77 % cream Apply to both feet and between toes bid x 28 days. Patient taking differently: Apply 1 application topically daily as needed (feet). Apply to both feet and between toes bid x 28 days. 02/05/18   Marzetta Board, DPM  clonazePAM (KLONOPIN) 2 MG tablet Take 2 mg by mouth daily as needed for anxiety. 07/12/18   [provider]  COMBIVENT RESPIMAT 20-100 MCG/ACT AERS respimat Inhale 1 puff into the lungs every 6 (six) hours as needed for wheezing or shortness of breath. 06/27/18   [provider]  Cyanocobalamin (VITAMIN B 12 PO) Take 1 tablet by mouth daily.    [provider]  cyclobenzaprine (FLEXERIL) 5 MG tablet TAKE ONE  TABLET BY MOUTH TWO TIMES A DAY AS NEEDED FOR MUSCLE SPASMS. MAY MAKE YOU DROWSY. 09/19/20 09/20/21  [provider]  cyclobenzaprine (FLEXERIL) 5 MG tablet Take 5 mg by mouth 2 (two) times daily. 09/19/20   [provider]  diclofenac Sodium (VOLTAREN) 1 % GEL Apply 2 g topically daily as needed (pain). 06/08/19   [provider]  docusate sodium (COLACE) 100 MG capsule Take 100 mg by mouth 2 (two) times daily as needed for mild constipation.    [provider]  furosemide (LASIX) 20 MG tablet Take 20 mg by mouth daily.    [provider]  gabapentin (NEURONTIN) 100 MG  capsule Take 100 mg by mouth daily. 10/30/18   [provider]  HYDROcodone-acetaminophen (NORCO/VICODIN) 5-325 MG per tablet Take 1 tablet by mouth every 6 (six) hours as needed for moderate pain.    [provider]  ketoconazole (NIZORAL) 2 % cream Apply 1 application topically daily. Apply to both feet and between toes once daily for 6 weeks Patient taking differently: Apply 1 application topically daily as needed for irritation. 10/04/18   Marzetta Board, DPM  latanoprost (XALATAN) 0.005 % ophthalmic solution Place 1 drop into both eyes at bedtime.    [provider]  lidocaine-prilocaine (EMLA) cream Apply 1 application topically daily as needed (pain).    [provider]  Menthol-Methyl Salicylate (THERA-GESIC) 1-15 % CREA Apply 1 application topically in the morning and at bedtime.    [provider]  metFORMIN (GLUCOPHAGE) 1000 MG tablet Take 1 tablet (1,000 mg total) by mouth 2 (two) times daily with a meal. 06/27/13   Shawna Orleans, Doe-Hyun R, DO  methylPREDNISolone (MEDROL DOSEPAK) 4 MG TBPK tablet Take taper as prescribed 11/06/20   Kinnie Feil, PA-C  naproxen (NAPROSYN) 500 MG tablet Take 1 tablet (500 mg total) by mouth 2 (two) times daily. 11/06/20   Kinnie Feil, PA-C  NARCAN 4 MG/0.1ML LIQD nasal spray kit SMARTSIG:Spray(s) Both  Nares 09/19/20   [provider]  omeprazole (PRILOSEC) 20 MG capsule Take 20 mg by mouth daily.    [provider]  psyllium (METAMUCIL) 58.6 % powder Take 1 packet by mouth daily as needed (constipation).    [provider]  QUEtiapine (SEROQUEL) 200 MG tablet Take 200 mg by mouth at bedtime. 08/05/18   [provider]  tamsulosin (FLOMAX) 0.4 MG CAPS capsule TAKE ONE CAPSULE BY MOUTH EVERY DAY FOR BENIGN PROSTATIC HYPERPLASIA 09/12/20 09/13/21  [provider]  tamsulosin (FLOMAX) 0.4 MG CAPS capsule Take 0.4 mg by mouth daily. 09/14/20   [provider]  tiZANidine (ZANAFLEX) 4 MG tablet TAKE ONE TABLET BY MOUTH THREE TIMES DAILY Patient not taking: No sig reported 05/03/16   Martinique, Betty G, MD  traZODone (DESYREL) 50 MG tablet Take 50 mg by mouth daily as needed for sleep. Patient stated that he is to take 8 tablets at bedtime    [provider]    Allergies    Other  Review of Systems   Review of Systems  Constitutional: Negative for chills and fever.  Respiratory: Negative for shortness of breath.   Cardiovascular: Negative for chest pain.  Musculoskeletal: Positive for back pain and myalgias.  Skin: Negative for wound.  Neurological: Negative for weakness and numbness.   Physical Exam Updated Vital Signs BP (!) 180/149 (BP Location: Left Arm)   Pulse 99   Temp 98.7 F (37.1 C) (Oral)   Resp 16   Wt (!) 149.7 kg   SpO2 99%   BMI 42.37 kg/m   Physical Exam Vitals and nursing note reviewed.  Constitutional:      General: He is not in acute distress.    Appearance: Normal appearance. He is not ill-appearing, toxic-appearing or diaphoretic.  HENT:     Head: Normocephalic and atraumatic.     Right Ear: External ear normal.     Left Ear: External ear normal.     Nose: Nose normal.     Mouth/Throat:     Mouth: Mucous membranes are moist.     Pharynx: Oropharynx is clear. No oropharyngeal exudate or posterior  oropharyngeal erythema.  Eyes:     Extraocular Movements: Extraocular movements intact.  Cardiovascular:     Rate and Rhythm: Normal rate and regular rhythm.     Pulses: Normal pulses.     Heart sounds: Normal heart sounds. No murmur heard. No friction rub. No gallop.   Pulmonary:     Effort: Pulmonary effort is normal. No respiratory distress.     Breath sounds: Normal breath sounds. No stridor. No wheezing, rhonchi or rales.  Abdominal:     General: Abdomen is flat.     Tenderness: There is no abdominal tenderness.  Musculoskeletal:        General: Tenderness present. Normal range of motion.     Cervical back: Normal range of motion and neck supple. No tenderness.     Comments: Mild tenderness noted diffusely along the bilateral lumbar musculature.  No midline C, T, or L-spine pain.  Patient able to stand and ambulate with a steady gait with the assistance of a walker.  Skin:    General: Skin is warm and dry.  Neurological:     General: No focal deficit present.     Mental Status: He is alert and oriented to person, place, and time.  Psychiatric:        Mood and Affect: Mood normal.        Behavior: Behavior normal.    ED Results / Procedures / Treatments   Labs (all labs ordered are listed, but only abnormal results are displayed) Labs Reviewed - No data to display  EKG None  Radiology No results found.  Procedures Procedures   Medications Ordered in ED Medications  ketorolac (TORADOL) 15 MG/ML injection 15 mg (has no administration in time range)  dexamethasone (DECADRON) injection 4 mg (has no administration in time range)    ED Course  I have reviewed the triage vital signs and the nursing notes.  Pertinent labs & imaging results that were available during my care of the patient were reviewed by me and considered in my medical decision making (see chart for details).    MDM Rules/Calculators/A&P                          Patient is a 70 year old male who  presents the emergency department with acute on chronic low back pain.  No midline spine pain.  No new falls or trauma.  Neurovascularly intact in the bilateral lower extremities.  No red flags.  Patient can stand and ambulate unassisted.  Patient states that he was given Decadron as well as Toradol earlier this month with significant relief of his pain.  We will give him additional Toradol and Decadron here today.  Denies any history of diabetes mellitus.  States he is not anticoagulated.  Patient is going to follow-up with his orthopedist regarding his symptoms.  He states he has an MRI of his lumbar spine scheduled for May 12.  Urged him to make sure that he calls his orthopedist as soon as possible to discuss his symptoms.  We discussed return precautions.  Feel that he is stable for discharge at this time and he is agreeable.  His questions were answered and he was amicable at the time of discharge.  Final Clinical Impression(s) / ED Diagnoses Final diagnoses:  Chronic bilateral low back pain without sciatica    Rx / DC Orders ED Discharge Orders    None       Rayna Sexton, PA-C 11/17/20 1757  Dorie Rank, MD 11/18/20 [phone]

## 2020-11-17 NOTE — ED Triage Notes (Signed)
Emergency Medicine Provider Triage Evaluation Note  Christopher Riley , a 70 y.o. male  was evaluated in triage.  Pt complains of low back pain.  Patient notes that he has chronic bilateral low back pain.  Recently worsened.  No new trauma.  Seen twice earlier this month with similar complaints.  Denies any history of diabetes mellitus.  Not anticoagulated.  States that he is typically given a Toradol shot as well as Decadron and this resolves his symptoms.  No chest pain, shortness of breath, urinary complaints, numbness, tingling, weakness.  Physical Exam  There were no vitals taken for this visit. Gen:   Awake, no distress   HEENT:  Atraumatic  Resp:  Normal effort  Cardiac:  Normal rate  Abd:   Nondistended, nontender  MSK:   Moves extremities without difficulty, mild tenderness noted in the bilateral lumbar musculature.  No midline spine pain. Neuro:  Speech clear   Medical Decision Making  Medically screening exam initiated at 4:42 PM.  Appropriate orders placed.  Christopher Riley was informed that the remainder of the evaluation will be completed by another provider, this initial triage assessment does not replace that evaluation, and the importance of remaining in the ED until their evaluation is complete.   Rayna Sexton, PA-C 11/17/20 1643

## 2020-11-17 NOTE — ED Triage Notes (Signed)
Pt with chronic back pain is here for increased pain without any recent or new trauma is here for toradol and steroid shot to help with the pain, ambulatory with rolator.

## 2020-11-17 NOTE — Discharge Instructions (Addendum)
Please continue to monitor your symptoms closely.  If they worsen, please return to the emergency department.  Please make sure you follow-up with your orthopedist and have your MRI obtained May 12.  It was a pleasure to meet you.

## 2020-12-27 ENCOUNTER — Emergency Department (HOSPITAL_COMMUNITY)
Admission: EM | Admit: 2020-12-27 | Discharge: 2020-12-27 | Disposition: A | Discharge status: Home / Self Care | Payer: No Typology Code available for payment source | Attending: Emergency Medicine | Admitting: Emergency Medicine

## 2020-12-27 ENCOUNTER — Other Ambulatory Visit: Payer: Self-pay

## 2020-12-27 DIAGNOSIS — Z8546 Personal history of malignant neoplasm of prostate: Secondary | ICD-10-CM | POA: Diagnosis not present

## 2020-12-27 DIAGNOSIS — Z79899 Other long term (current) drug therapy: Secondary | ICD-10-CM | POA: Diagnosis not present

## 2020-12-27 DIAGNOSIS — J45909 Unspecified asthma, uncomplicated: Secondary | ICD-10-CM | POA: Diagnosis not present

## 2020-12-27 DIAGNOSIS — E1140 Type 2 diabetes mellitus with diabetic neuropathy, unspecified: Secondary | ICD-10-CM | POA: Insufficient documentation

## 2020-12-27 DIAGNOSIS — Z7984 Long term (current) use of oral hypoglycemic drugs: Secondary | ICD-10-CM | POA: Diagnosis not present

## 2020-12-27 DIAGNOSIS — M25561 Pain in right knee: Secondary | ICD-10-CM | POA: Diagnosis present

## 2020-12-27 DIAGNOSIS — I10 Essential (primary) hypertension: Secondary | ICD-10-CM | POA: Diagnosis not present

## 2020-12-27 DIAGNOSIS — Z96651 Presence of right artificial knee joint: Secondary | ICD-10-CM | POA: Diagnosis not present

## 2020-12-27 MED ORDER — KETOROLAC TROMETHAMINE 60 MG/2ML IM SOLN
60.0000 mg | Freq: Once | INTRAMUSCULAR | Status: AC
  Administered 2020-12-27: 60 mg via INTRAMUSCULAR
  Filled 2020-12-27: qty 2

## 2020-12-27 MED ORDER — METHYLPREDNISOLONE SODIUM SUCC 125 MG IJ SOLR
125.0000 mg | Freq: Once | INTRAMUSCULAR | Status: AC
  Administered 2020-12-27: 125 mg via INTRAMUSCULAR
  Filled 2020-12-27: qty 2
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
## 2020-12-27 NOTE — ED Triage Notes (Signed)
Patient reports right knee pain, chronic, got worse a week ago. Patient says he has ortho appt, but pain is unbearable. Says he got steroid shots before and it helped. Pain 10/10.

## 2020-12-27 NOTE — Discharge Instructions (Addendum)
Follow up with your Physician as scheduled  

## 2020-12-28 ENCOUNTER — Emergency Department (HOSPITAL_COMMUNITY)
Admission: EM | Admit: 2020-12-28 | Discharge: 2020-12-28 | Disposition: A | Discharge status: Home / Self Care | Payer: No Typology Code available for payment source | Attending: Emergency Medicine | Admitting: Emergency Medicine

## 2020-12-28 ENCOUNTER — Other Ambulatory Visit: Payer: Self-pay

## 2020-12-28 DIAGNOSIS — I10 Essential (primary) hypertension: Secondary | ICD-10-CM | POA: Insufficient documentation

## 2020-12-28 DIAGNOSIS — Z7984 Long term (current) use of oral hypoglycemic drugs: Secondary | ICD-10-CM | POA: Diagnosis not present

## 2020-12-28 DIAGNOSIS — Z96651 Presence of right artificial knee joint: Secondary | ICD-10-CM | POA: Insufficient documentation

## 2020-12-28 DIAGNOSIS — Z79899 Other long term (current) drug therapy: Secondary | ICD-10-CM | POA: Diagnosis not present

## 2020-12-28 DIAGNOSIS — E1140 Type 2 diabetes mellitus with diabetic neuropathy, unspecified: Secondary | ICD-10-CM | POA: Diagnosis not present

## 2020-12-28 DIAGNOSIS — R519 Headache, unspecified: Secondary | ICD-10-CM | POA: Diagnosis present

## 2020-12-28 NOTE — ED Triage Notes (Signed)
Pt states that he had a h/a tonight. Pt states that he took his BP at home was 270/190. Pt took two BP meds before arriving. Current BP 152/72. H/a is gone currently
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
## 2020-12-29 ENCOUNTER — Ambulatory Visit (INDEPENDENT_AMBULATORY_CARE_PROVIDER_SITE_OTHER): Payer: Medicare Other | Admitting: Podiatry

## 2020-12-29 ENCOUNTER — Encounter: Payer: Self-pay | Admitting: Podiatry

## 2020-12-29 ENCOUNTER — Other Ambulatory Visit: Payer: Self-pay | Admitting: Orthopedic Surgery

## 2020-12-29 ENCOUNTER — Other Ambulatory Visit (HOSPITAL_COMMUNITY): Payer: Self-pay | Admitting: Orthopedic Surgery

## 2020-12-29 DIAGNOSIS — B351 Tinea unguium: Secondary | ICD-10-CM

## 2020-12-29 DIAGNOSIS — Q828 Other specified congenital malformations of skin: Secondary | ICD-10-CM

## 2020-12-29 DIAGNOSIS — M79675 Pain in left toe(s): Secondary | ICD-10-CM

## 2020-12-29 DIAGNOSIS — T8484XA Pain due to internal orthopedic prosthetic devices, implants and grafts, initial encounter: Secondary | ICD-10-CM

## 2020-12-29 DIAGNOSIS — M79674 Pain in right toe(s): Secondary | ICD-10-CM

## 2020-12-29 DIAGNOSIS — E1142 Type 2 diabetes mellitus with diabetic polyneuropathy: Secondary | ICD-10-CM

## 2020-12-29 NOTE — Progress Notes (Signed)
This patient returns to my office for at risk foot care.  This patient requires this care by a professional since this patient will be at risk due to having diabetic neuropathy.  This patient is unable to cut nails himself since the patient cannot reach his nails.These nails are painful walking and wearing shoes. Patient has painful callus left foot. This patient presents for at risk foot care today.  General Appearance  Alert, conversant and in no acute stress.  Vascular  Dorsalis pedis and posterior tibial  pulses are palpable  bilaterally.  Capillary return is within normal limits  bilaterally. Temperature is within normal limits  bilaterally.  Neurologic  Senn-Weinstein monofilament wire test within normal limits  bilaterally. Muscle power within normal limits bilaterally.  Nails Thick disfigured discolored nails with subungual debris  from hallux to fifth toes bilaterally. No evidence of bacterial infection or drainage bilaterally.  Orthopedic  No limitations of motion  feet .  No crepitus or effusions noted.  No bony pathology or digital deformities noted.  Skin  normotropic skin  noted bilaterally.  No signs of infections or ulcers noted.   Porokeratosis sub 5th met left foot.  Onychomycosis  Pain in right toes  Pain in left toes  Porokeratosis  Left foot.  Consent was obtained for treatment procedures.   Mechanical debridement of nails 1-5  bilaterally performed with a nail nipper.  Filed with dremel without incident.    Return office visit   3 months                   Told patient to return for periodic foot care and evaluation due to potential at risk complications.   [name] Barefoot DPM

## 2021-01-06 ENCOUNTER — Encounter (HOSPITAL_COMMUNITY): Payer: Medicare Other

## 2021-01-06 ENCOUNTER — Encounter (HOSPITAL_COMMUNITY): Payer: Medicare Other | Attending: Orthopedic Surgery

## 2021-01-06 ENCOUNTER — Encounter (HOSPITAL_COMMUNITY): Payer: Self-pay

## 2021-01-07 ENCOUNTER — Ambulatory Visit: Payer: No Typology Code available for payment source | Admitting: Podiatry

## 2021-01-08 ENCOUNTER — Other Ambulatory Visit: Payer: Self-pay

## 2021-01-08 ENCOUNTER — Encounter (HOSPITAL_COMMUNITY): Payer: Self-pay

## 2021-01-08 ENCOUNTER — Emergency Department (HOSPITAL_COMMUNITY)
Admission: EM | Admit: 2021-01-08 | Discharge: 2021-01-08 | Disposition: A | Discharge status: Home / Self Care | Payer: No Typology Code available for payment source | Attending: Emergency Medicine | Admitting: Emergency Medicine

## 2021-01-08 DIAGNOSIS — Z7984 Long term (current) use of oral hypoglycemic drugs: Secondary | ICD-10-CM | POA: Diagnosis not present

## 2021-01-08 DIAGNOSIS — Z8546 Personal history of malignant neoplasm of prostate: Secondary | ICD-10-CM | POA: Insufficient documentation

## 2021-01-08 DIAGNOSIS — G8929 Other chronic pain: Secondary | ICD-10-CM | POA: Insufficient documentation

## 2021-01-08 DIAGNOSIS — R600 Localized edema: Secondary | ICD-10-CM | POA: Insufficient documentation

## 2021-01-08 DIAGNOSIS — E1140 Type 2 diabetes mellitus with diabetic neuropathy, unspecified: Secondary | ICD-10-CM | POA: Diagnosis not present

## 2021-01-08 DIAGNOSIS — I10 Essential (primary) hypertension: Secondary | ICD-10-CM | POA: Insufficient documentation

## 2021-01-08 DIAGNOSIS — M545 Low back pain, unspecified: Secondary | ICD-10-CM | POA: Insufficient documentation

## 2021-01-08 DIAGNOSIS — M25561 Pain in right knee: Secondary | ICD-10-CM | POA: Diagnosis not present

## 2021-01-08 DIAGNOSIS — Z79899 Other long term (current) drug therapy: Secondary | ICD-10-CM | POA: Insufficient documentation

## 2021-01-08 DIAGNOSIS — J45909 Unspecified asthma, uncomplicated: Secondary | ICD-10-CM | POA: Insufficient documentation

## 2021-01-08 DIAGNOSIS — Z96651 Presence of right artificial knee joint: Secondary | ICD-10-CM | POA: Diagnosis not present

## 2021-01-08 MED ORDER — DEXAMETHASONE SODIUM PHOSPHATE 10 MG/ML IJ SOLN
10.0000 mg | Freq: Once | INTRAMUSCULAR | Status: AC
  Administered 2021-01-08: 10 mg via INTRAMUSCULAR
  Filled 2021-01-08: qty 1

## 2021-01-08 MED ORDER — KETOROLAC TROMETHAMINE 30 MG/ML IJ SOLN
30.0000 mg | Freq: Once | INTRAMUSCULAR | Status: AC
  Administered 2021-01-08: 30 mg via INTRAMUSCULAR
  Filled 2021-01-08: qty 1

## 2021-01-08 NOTE — ED Triage Notes (Signed)
Pt c/o chronic back pain and R knee pain. States pain has increased over the last few days. Has been ortho for same, denies new injury
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
## 2021-01-21 ENCOUNTER — Ambulatory Visit (HOSPITAL_COMMUNITY): Payer: No Typology Code available for payment source

## 2021-01-21 ENCOUNTER — Encounter (HOSPITAL_COMMUNITY): Payer: Self-pay

## 2021-01-23 ENCOUNTER — Encounter: Payer: Self-pay | Admitting: Podiatry

## 2021-01-23 ENCOUNTER — Ambulatory Visit (INDEPENDENT_AMBULATORY_CARE_PROVIDER_SITE_OTHER): Payer: No Typology Code available for payment source | Admitting: Podiatry

## 2021-01-23 ENCOUNTER — Other Ambulatory Visit: Payer: Self-pay

## 2021-01-23 DIAGNOSIS — R262 Difficulty in walking, not elsewhere classified: Secondary | ICD-10-CM | POA: Insufficient documentation

## 2021-01-23 DIAGNOSIS — M25561 Pain in right knee: Secondary | ICD-10-CM | POA: Insufficient documentation

## 2021-01-23 DIAGNOSIS — Z7409 Other reduced mobility: Secondary | ICD-10-CM | POA: Insufficient documentation

## 2021-01-23 DIAGNOSIS — H524 Presbyopia: Secondary | ICD-10-CM | POA: Insufficient documentation

## 2021-01-23 DIAGNOSIS — R32 Unspecified urinary incontinence: Secondary | ICD-10-CM | POA: Insufficient documentation

## 2021-01-23 DIAGNOSIS — E119 Type 2 diabetes mellitus without complications: Secondary | ICD-10-CM | POA: Insufficient documentation

## 2021-01-23 DIAGNOSIS — Z96651 Presence of right artificial knee joint: Secondary | ICD-10-CM | POA: Insufficient documentation

## 2021-01-23 DIAGNOSIS — M159 Polyosteoarthritis, unspecified: Secondary | ICD-10-CM | POA: Insufficient documentation

## 2021-01-23 DIAGNOSIS — K625 Hemorrhage of anus and rectum: Secondary | ICD-10-CM | POA: Insufficient documentation

## 2021-01-23 DIAGNOSIS — K635 Polyp of colon: Secondary | ICD-10-CM | POA: Insufficient documentation

## 2021-01-23 DIAGNOSIS — N200 Calculus of kidney: Secondary | ICD-10-CM | POA: Insufficient documentation

## 2021-01-23 DIAGNOSIS — Q828 Other specified congenital malformations of skin: Secondary | ICD-10-CM

## 2021-01-23 DIAGNOSIS — Z9181 History of falling: Secondary | ICD-10-CM | POA: Insufficient documentation

## 2021-01-23 DIAGNOSIS — M238X9 Other internal derangements of unspecified knee: Secondary | ICD-10-CM | POA: Insufficient documentation

## 2021-01-23 DIAGNOSIS — Z659 Problem related to unspecified psychosocial circumstances: Secondary | ICD-10-CM | POA: Insufficient documentation

## 2021-01-23 DIAGNOSIS — R2689 Other abnormalities of gait and mobility: Secondary | ICD-10-CM | POA: Insufficient documentation

## 2021-01-23 DIAGNOSIS — M216X2 Other acquired deformities of left foot: Secondary | ICD-10-CM

## 2021-01-23 DIAGNOSIS — R3129 Other microscopic hematuria: Secondary | ICD-10-CM | POA: Insufficient documentation

## 2021-01-23 DIAGNOSIS — G8929 Other chronic pain: Secondary | ICD-10-CM | POA: Insufficient documentation

## 2021-01-23 DIAGNOSIS — E1142 Type 2 diabetes mellitus with diabetic polyneuropathy: Secondary | ICD-10-CM

## 2021-01-23 DIAGNOSIS — I8390 Asymptomatic varicose veins of unspecified lower extremity: Secondary | ICD-10-CM | POA: Insufficient documentation

## 2021-01-23 DIAGNOSIS — R972 Elevated prostate specific antigen [PSA]: Secondary | ICD-10-CM | POA: Insufficient documentation

## 2021-01-23 DIAGNOSIS — M170 Bilateral primary osteoarthritis of knee: Secondary | ICD-10-CM | POA: Insufficient documentation

## 2021-01-23 DIAGNOSIS — E1165 Type 2 diabetes mellitus with hyperglycemia: Secondary | ICD-10-CM | POA: Insufficient documentation

## 2021-01-23 DIAGNOSIS — Z9989 Dependence on other enabling machines and devices: Secondary | ICD-10-CM | POA: Insufficient documentation

## 2021-01-23 DIAGNOSIS — L84 Corns and callosities: Secondary | ICD-10-CM

## 2021-01-23 DIAGNOSIS — Z0120 Encounter for dental examination and cleaning without abnormal findings: Secondary | ICD-10-CM | POA: Insufficient documentation

## 2021-01-23 DIAGNOSIS — L989 Disorder of the skin and subcutaneous tissue, unspecified: Secondary | ICD-10-CM | POA: Insufficient documentation

## 2021-01-23 DIAGNOSIS — M7750 Other enthesopathy of unspecified foot: Secondary | ICD-10-CM | POA: Insufficient documentation

## 2021-01-23 DIAGNOSIS — H269 Unspecified cataract: Secondary | ICD-10-CM | POA: Insufficient documentation

## 2021-01-23 DIAGNOSIS — K640 First degree hemorrhoids: Secondary | ICD-10-CM | POA: Insufficient documentation

## 2021-01-23 DIAGNOSIS — K2971 Gastritis, unspecified, with bleeding: Secondary | ICD-10-CM | POA: Insufficient documentation

## 2021-01-23 DIAGNOSIS — G4710 Hypersomnia, unspecified: Secondary | ICD-10-CM | POA: Insufficient documentation

## 2021-01-23 DIAGNOSIS — H401113 Primary open-angle glaucoma, right eye, severe stage: Secondary | ICD-10-CM | POA: Insufficient documentation

## 2021-01-23 DIAGNOSIS — K2270 Barrett's esophagus without dysplasia: Secondary | ICD-10-CM | POA: Insufficient documentation

## 2021-01-23 DIAGNOSIS — I890 Lymphedema, not elsewhere classified: Secondary | ICD-10-CM | POA: Insufficient documentation

## 2021-01-23 DIAGNOSIS — M6281 Muscle weakness (generalized): Secondary | ICD-10-CM | POA: Insufficient documentation

## 2021-01-23 NOTE — Progress Notes (Signed)
Patient says his callus under ball of left foot is painful.  Says he needs additional callus treatment.  RTC prn.  GAM

## 2021-01-27 ENCOUNTER — Encounter (HOSPITAL_COMMUNITY)
Admission: RE | Admit: 2021-01-27 | Discharge: 2021-01-27 | Disposition: A | Discharge status: Home / Self Care | Payer: Medicare Other | Source: Ambulatory Visit | Attending: Orthopedic Surgery | Admitting: Orthopedic Surgery

## 2021-01-27 ENCOUNTER — Other Ambulatory Visit: Payer: Self-pay

## 2021-01-27 DIAGNOSIS — T8484XA Pain due to internal orthopedic prosthetic devices, implants and grafts, initial encounter: Secondary | ICD-10-CM

## 2021-01-27 MED ORDER — TECHNETIUM TC 99M MEDRONATE IV KIT
21.3000 | PACK | Freq: Once | INTRAVENOUS | Status: AC
  Administered 2021-01-27: 21.3 via INTRAVENOUS

## 2021-02-07 ENCOUNTER — Other Ambulatory Visit: Payer: Self-pay

## 2021-02-07 ENCOUNTER — Emergency Department (HOSPITAL_COMMUNITY)
Admission: EM | Admit: 2021-02-07 | Discharge: 2021-02-07 | Disposition: A | Discharge status: Home / Self Care | Payer: No Typology Code available for payment source | Attending: Emergency Medicine | Admitting: Emergency Medicine

## 2021-02-07 DIAGNOSIS — Z7984 Long term (current) use of oral hypoglycemic drugs: Secondary | ICD-10-CM | POA: Insufficient documentation

## 2021-02-07 DIAGNOSIS — Z96651 Presence of right artificial knee joint: Secondary | ICD-10-CM | POA: Insufficient documentation

## 2021-02-07 DIAGNOSIS — M549 Dorsalgia, unspecified: Secondary | ICD-10-CM | POA: Diagnosis not present

## 2021-02-07 DIAGNOSIS — M25562 Pain in left knee: Secondary | ICD-10-CM | POA: Diagnosis present

## 2021-02-07 DIAGNOSIS — E1140 Type 2 diabetes mellitus with diabetic neuropathy, unspecified: Secondary | ICD-10-CM | POA: Insufficient documentation

## 2021-02-07 DIAGNOSIS — I10 Essential (primary) hypertension: Secondary | ICD-10-CM | POA: Diagnosis not present

## 2021-02-07 DIAGNOSIS — M25561 Pain in right knee: Secondary | ICD-10-CM

## 2021-02-07 DIAGNOSIS — Z79899 Other long term (current) drug therapy: Secondary | ICD-10-CM | POA: Diagnosis not present

## 2021-02-07 MED ORDER — DEXAMETHASONE SODIUM PHOSPHATE 10 MG/ML IJ SOLN
10.0000 mg | Freq: Once | INTRAMUSCULAR | Status: AC
  Administered 2021-02-07: 10 mg via INTRAMUSCULAR
  Filled 2021-02-07: qty 1

## 2021-02-07 NOTE — Discharge Instructions (Addendum)
Follow-up with your orthopedist, Dr. Lyla Glassing, for further care and treatment of your knee discomfort

## 2021-02-07 NOTE — ED Triage Notes (Signed)
Patient c/o chronic back pain and bilateral knee pain. He reports he recently was seen by ortho at [facility] for his spine and knees. He reports he was told by the surgeon that nothing could be done for his spine. He states he is waiting for his insurance to approve injections for his knees which he will receive for 3 months. He is requesting pain control for his back and knees until he can begin his treatment at [facility]. He has a hx of HTN, prostate cancer and type 2 diabetes.
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
## 2021-02-13 ENCOUNTER — Other Ambulatory Visit: Payer: Self-pay

## 2021-02-13 ENCOUNTER — Ambulatory Visit (INDEPENDENT_AMBULATORY_CARE_PROVIDER_SITE_OTHER): Payer: No Typology Code available for payment source | Admitting: Podiatry

## 2021-02-13 DIAGNOSIS — E1142 Type 2 diabetes mellitus with diabetic polyneuropathy: Secondary | ICD-10-CM

## 2021-02-13 DIAGNOSIS — M79675 Pain in left toe(s): Secondary | ICD-10-CM | POA: Diagnosis not present

## 2021-02-13 DIAGNOSIS — Q828 Other specified congenital malformations of skin: Secondary | ICD-10-CM | POA: Diagnosis not present

## 2021-02-13 DIAGNOSIS — B351 Tinea unguium: Secondary | ICD-10-CM

## 2021-02-13 DIAGNOSIS — L84 Corns and callosities: Secondary | ICD-10-CM

## 2021-02-13 DIAGNOSIS — M79674 Pain in right toe(s): Secondary | ICD-10-CM

## 2021-02-15 ENCOUNTER — Encounter: Payer: Self-pay | Admitting: Podiatry

## 2021-02-15 NOTE — Progress Notes (Signed)
  Subjective:  Patient ID: Christopher Riley, male    DOB: [birth year],  MRN: [id number]  70 y.o. male presents with at risk foot care with history of diabetic neuropathy, callus(es) right foot. Aggravating factors include weightbearing with and without shoe gear. Pain is relieved with periodic professional debridement., and painful porokeratotic lesion(s) left foot and painful mycotic toenails that limit ambulation. Painful toenails interfere with ambulation. Aggravating factors include wearing enclosed shoe gear. Pain is relieved with periodic professional debridement. Painful porokeratotic lesions are aggravated when weightbearing with and without shoegear. Pain is relieved with periodic professional debridement..    Patient does not monitor blood glucose on a daily basis.  [facility] and last visit was:   Review of Systems: Negative except as noted in the HPI.   Allergies  Allergen Reactions   Other Other (See Comments)    Non steroidal anti inflammatory analgesic - GI bleed    Objective:  There were no vitals filed for this visit. Constitutional Patient is a pleasant 70 y.o. African American male morbidly obese in NAD. AAO x 3.  Vascular Capillary fill time to digits <3 seconds b/l lower extremities. Palpable pedal pulses b/l LE. Pedal hair absent. Lower extremity skin temperature gradient within normal limits. No pain with calf compression b/l. Nonpitting edema noted b/l lower extremities. Varicosities present b/l. No cyanosis or clubbing noted.  Neurologic Normal speech. Protective sensation diminished with 10g monofilament b/l.  Dermatologic Pedal skin is thin shiny, atrophic b/l lower extremities. No open wounds b/l lower extremities. No interdigital macerations b/l lower extremities. Toenails 1-5 b/l elongated, discolored, dystrophic, thickened, crumbly with subungual debris and tenderness to dorsal palpation. Hyperkeratotic lesion(s) submet head 3 right foot.  No  erythema, no edema, no drainage, no fluctuance. Porokeratotic lesion(s) submet head 5 left foot. No erythema, no edema, no drainage, no fluctuance.  Orthopedic: Normal muscle strength 5/5 to all lower extremity muscle groups bilaterally. No pain crepitus or joint limitation noted with ROM b/l. Hammertoe(s) noted to the L hallux and R hallux. Utilizes rollator for ambulation assistance.   No flowsheet data found.     Assessment:   1. Pain due to onychomycosis of toenails of both feet   2. Porokeratosis   3. Callus   4. Diabetic peripheral neuropathy associated with type 2 diabetes mellitus ([place])    Plan:  Patient was evaluated and treated and all questions answered.  Onychomycosis with pain -Nails palliatively debridement as below. -Educated on self-care  Procedure: Nail Debridement Rationale: Pain Type of Debridement: manual, sharp debridement. Instrumentation: Nail nipper, rotary burr. Number of Nails: 10  -Examined patient. -Continue diabetic foot care principles. -Patient to continue soft, supportive shoe gear daily. -Toenails 1-5 b/l were debrided in length and girth with sterile nail nippers and dremel without iatrogenic bleeding.  -Callus(es) submet head 3 right foot pared utilizing sterile scalpel blade without complication or incident. Total number debrided =1. -Painful porokeratotic lesion(s) submet head 5 left foot pared and enucleated with sterile scalpel blade without incident. Total number of lesions debrided=1. -Patient to report any pedal injuries to medical professional immediately. -Patient/POA to call should there be question/concern in the interim.  Return in about 6 weeks (around 03/27/2021).  Marzetta Board, DPM

## 2021-02-16 ENCOUNTER — Ambulatory Visit: Payer: No Typology Code available for payment source | Admitting: Podiatry

## 2021-02-17 ENCOUNTER — Other Ambulatory Visit: Payer: Self-pay

## 2021-02-17 ENCOUNTER — Emergency Department (HOSPITAL_COMMUNITY)
Admission: EM | Admit: 2021-02-17 | Discharge: 2021-02-17 | Discharge status: Against Medical Advice | Payer: Non-veteran care | Attending: Emergency Medicine | Admitting: Emergency Medicine

## 2021-02-17 ENCOUNTER — Emergency Department (HOSPITAL_COMMUNITY)
Admission: EM | Admit: 2021-02-17 | Discharge: 2021-02-17 | Discharge status: Against Medical Advice | Payer: No Typology Code available for payment source

## 2021-02-17 ENCOUNTER — Encounter (HOSPITAL_COMMUNITY): Payer: Self-pay

## 2021-02-17 DIAGNOSIS — Z5321 Procedure and treatment not carried out due to patient leaving prior to being seen by health care provider: Secondary | ICD-10-CM | POA: Insufficient documentation

## 2021-02-17 DIAGNOSIS — K921 Melena: Secondary | ICD-10-CM | POA: Diagnosis not present

## 2021-02-17 NOTE — ED Notes (Signed)
Pt called for vital update No Answer

## 2021-02-17 NOTE — ED Provider Notes (Signed)
Emergency Medicine Provider Triage Evaluation Note  Christopher Riley , a 70 y.o. male  was evaluated in triage.  Pt complains of black tarry stool x2 weeks.  History of the same, denies any heavy ibuprofen or anti-inflammatory medicine use.  He has a history of the same, but not recently.  He is not have any abdominal pain, nausea, vomiting, shortness of breath, lightheadedness.  Has not taken any iron supplements or Pepto-Bismol..  Review of Systems  Positive: Black tarry stool Negative: See above  Physical Exam  BP (!) 173/82 (BP Location: Left Arm)   Pulse 71   Temp 98.3 F (36.8 C) (Oral)   Resp 18   Ht '6\' 2"'$  (1.88 m)   Wt (!) 147.4 kg   SpO2 98%   BMI 41.73 kg/m  Gen:   Awake, no distress   Resp:  Normal effort  MSK:   Moves extremities without difficulty  Other:  No abdominal tenderness  Medical Decision Making  Medically screening exam initiated at 11:03 AM.  Appropriate orders placed.  [name] was informed that the remainder of the evaluation will be completed by another provider, this initial triage assessment does not replace that evaluation, and the importance of remaining in the ED until their evaluation is complete.     Sherrill Raring, PA-C 02/17/21 1104    Davonna Belling, MD 02/17/21 [phone]

## 2021-02-17 NOTE — ED Triage Notes (Signed)
Patient c/o black stools x 2 weeks. Patient states, "I really don't have abdominal pain."

## 2021-02-17 NOTE — ED Notes (Signed)
Went to bring patient back for triage and the patient stated that he had left and then he came back and wanted to know if he had to start over. Patient was informed that he had to start over and needed to have fnew vital signs. Patient stated, "I will just go home and come back in the morning."

## 2021-02-18 ENCOUNTER — Encounter (HOSPITAL_COMMUNITY): Payer: Self-pay

## 2021-02-18 ENCOUNTER — Emergency Department (HOSPITAL_COMMUNITY)
Admission: EM | Admit: 2021-02-18 | Discharge: 2021-02-18 | Disposition: A | Discharge status: Home / Self Care | Payer: No Typology Code available for payment source | Attending: Emergency Medicine | Admitting: Emergency Medicine

## 2021-02-18 ENCOUNTER — Other Ambulatory Visit: Payer: Self-pay

## 2021-02-18 DIAGNOSIS — I10 Essential (primary) hypertension: Secondary | ICD-10-CM | POA: Diagnosis not present

## 2021-02-18 DIAGNOSIS — Z7984 Long term (current) use of oral hypoglycemic drugs: Secondary | ICD-10-CM | POA: Diagnosis not present

## 2021-02-18 DIAGNOSIS — Z79899 Other long term (current) drug therapy: Secondary | ICD-10-CM | POA: Diagnosis not present

## 2021-02-18 DIAGNOSIS — E1142 Type 2 diabetes mellitus with diabetic polyneuropathy: Secondary | ICD-10-CM | POA: Insufficient documentation

## 2021-02-18 DIAGNOSIS — Z96651 Presence of right artificial knee joint: Secondary | ICD-10-CM | POA: Insufficient documentation

## 2021-02-18 DIAGNOSIS — K921 Melena: Secondary | ICD-10-CM | POA: Insufficient documentation

## 2021-02-18 DIAGNOSIS — R195 Other fecal abnormalities: Secondary | ICD-10-CM

## 2021-02-18 LAB — BASIC METABOLIC PANEL
Anion gap: 10 (ref 5–15)
BUN: 12 mg/dL (ref 8–23)
CO2: 31 mmol/L (ref 22–32)
Calcium: 9.1 mg/dL (ref 8.9–10.3)
Chloride: 101 mmol/L (ref 98–111)
Creatinine, Ser: 0.9 mg/dL (ref 0.61–1.24)
GFR, Estimated: >60 mL/min (ref >60)
Glucose, Bld: 126 mg/dL — ABNORMAL HIGH (ref 70–99)
Potassium: 4.2 mmol/L (ref 3.5–5.1)
Sodium: 142 mmol/L (ref 135–145)

## 2021-02-18 LAB — CBC WITH DIFFERENTIAL/PLATELET
Abs Immature Granulocytes: 0.03 10*3/uL (ref 0.00–0.07)
Basophils Absolute: 0 10*3/uL (ref 0.0–0.1)
Basophils Relative: 0 %
Eosinophils Absolute: 0.3 10*3/uL (ref 0.0–0.5)
Eosinophils Relative: 4 %
HCT: 44.3 % (ref 39.0–52.0)
Hemoglobin: 13.5 g/dL (ref 13.0–17.0)
Immature Granulocytes: 0 %
Lymphocytes Relative: 19 %
Lymphs Abs: 1.3 10*3/uL (ref 0.7–4.0)
MCH: 26.2 pg (ref 26.0–34.0)
MCHC: 30.5 g/dL (ref 30.0–36.0)
MCV: 86 fL (ref 80.0–100.0)
Monocytes Absolute: 0.5 10*3/uL (ref 0.1–1.0)
Monocytes Relative: 7 %
Neutro Abs: 4.7 10*3/uL (ref 1.7–7.7)
Neutrophils Relative %: 70 %
Platelets: 252 10*3/uL (ref 150–400)
RBC: 5.15 MIL/uL (ref 4.22–5.81)
RDW: 14.4 % (ref 11.5–15.5)
WBC: 6.9 10*3/uL (ref 4.0–10.5)
nRBC: 0 % (ref 0.0–0.2)

## 2021-02-18 LAB — POC OCCULT BLOOD, ED: Fecal Occult Bld: NEGATIVE

## 2021-02-18 NOTE — ED Triage Notes (Signed)
Pt complains of black stools for 2 weeks. No complaints of pain.

## 2021-02-18 NOTE — Discharge Instructions (Addendum)
Follow-up with your doctors at the [place].
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
## 2021-03-16 ENCOUNTER — Ambulatory Visit: Payer: No Typology Code available for payment source | Admitting: Podiatry

## 2021-03-18 ENCOUNTER — Other Ambulatory Visit: Payer: Self-pay

## 2021-03-18 ENCOUNTER — Encounter (HOSPITAL_COMMUNITY): Payer: Self-pay | Admitting: Emergency Medicine

## 2021-03-18 ENCOUNTER — Emergency Department (HOSPITAL_COMMUNITY)
Admission: EM | Admit: 2021-03-18 | Discharge: 2021-03-18 | Disposition: A | Discharge status: Home / Self Care | Payer: No Typology Code available for payment source | Attending: Emergency Medicine | Admitting: Emergency Medicine

## 2021-03-18 DIAGNOSIS — M549 Dorsalgia, unspecified: Secondary | ICD-10-CM | POA: Diagnosis not present

## 2021-03-18 DIAGNOSIS — Z79899 Other long term (current) drug therapy: Secondary | ICD-10-CM | POA: Diagnosis not present

## 2021-03-18 DIAGNOSIS — G8929 Other chronic pain: Secondary | ICD-10-CM | POA: Insufficient documentation

## 2021-03-18 DIAGNOSIS — J45909 Unspecified asthma, uncomplicated: Secondary | ICD-10-CM | POA: Diagnosis not present

## 2021-03-18 DIAGNOSIS — Z86010 Personal history of colonic polyps: Secondary | ICD-10-CM | POA: Insufficient documentation

## 2021-03-18 DIAGNOSIS — Z8546 Personal history of malignant neoplasm of prostate: Secondary | ICD-10-CM | POA: Diagnosis not present

## 2021-03-18 DIAGNOSIS — Z7984 Long term (current) use of oral hypoglycemic drugs: Secondary | ICD-10-CM | POA: Insufficient documentation

## 2021-03-18 DIAGNOSIS — I10 Essential (primary) hypertension: Secondary | ICD-10-CM | POA: Insufficient documentation

## 2021-03-18 DIAGNOSIS — E1142 Type 2 diabetes mellitus with diabetic polyneuropathy: Secondary | ICD-10-CM | POA: Insufficient documentation

## 2021-03-18 DIAGNOSIS — M545 Low back pain, unspecified: Secondary | ICD-10-CM

## 2021-03-18 DIAGNOSIS — Z96651 Presence of right artificial knee joint: Secondary | ICD-10-CM | POA: Insufficient documentation

## 2021-03-18 MED ORDER — DEXAMETHASONE SODIUM PHOSPHATE 10 MG/ML IJ SOLN
8.0000 mg | Freq: Once | INTRAMUSCULAR | Status: AC
  Administered 2021-03-18: 8 mg via INTRAMUSCULAR
  Filled 2021-03-18: qty 1

## 2021-03-18 NOTE — ED Triage Notes (Signed)
Per pt, states he is here for a steroid shot for his lower back pain-[facility]-has gotten [facility] here in the past
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
## 2021-03-18 NOTE — Discharge Instructions (Addendum)
[facility]. I would also recommend discussing your medication list with them, specifically whether he should be on something for hyperlipidemia (high cholesterol). Continue taking Tylenol as needed for pain control. Return if things change or worsen.

## 2021-03-20 ENCOUNTER — Ambulatory Visit (INDEPENDENT_AMBULATORY_CARE_PROVIDER_SITE_OTHER): Payer: No Typology Code available for payment source | Admitting: Podiatry

## 2021-03-20 ENCOUNTER — Other Ambulatory Visit: Payer: Self-pay

## 2021-03-20 ENCOUNTER — Encounter: Payer: Self-pay | Admitting: Podiatry

## 2021-03-20 DIAGNOSIS — Q828 Other specified congenital malformations of skin: Secondary | ICD-10-CM

## 2021-03-20 DIAGNOSIS — E66813 Obesity, class 3: Secondary | ICD-10-CM | POA: Insufficient documentation

## 2021-03-20 DIAGNOSIS — M79675 Pain in left toe(s): Secondary | ICD-10-CM | POA: Diagnosis not present

## 2021-03-20 DIAGNOSIS — M79674 Pain in right toe(s): Secondary | ICD-10-CM | POA: Diagnosis not present

## 2021-03-20 DIAGNOSIS — L84 Corns and callosities: Secondary | ICD-10-CM | POA: Diagnosis not present

## 2021-03-20 DIAGNOSIS — E1142 Type 2 diabetes mellitus with diabetic polyneuropathy: Secondary | ICD-10-CM | POA: Diagnosis not present

## 2021-03-20 DIAGNOSIS — Z6841 Body Mass Index (BMI) 40.0 and over, adult: Secondary | ICD-10-CM | POA: Insufficient documentation

## 2021-03-20 DIAGNOSIS — B351 Tinea unguium: Secondary | ICD-10-CM

## 2021-03-20 DIAGNOSIS — L853 Xerosis cutis: Secondary | ICD-10-CM | POA: Insufficient documentation

## 2021-03-25 ENCOUNTER — Ambulatory Visit: Payer: No Typology Code available for payment source | Admitting: Podiatry

## 2021-03-25 NOTE — Progress Notes (Signed)
  Subjective:  Patient ID: Christopher Riley, male    DOB: August 05, 1950,  MRN: [id number]  70 y.o. male presents with at risk foot care with history of diabetic neuropathy, callus(es) right foot. Aggravating factors include weightbearing with and without shoe gear. Pain is relieved with periodic professional debridement., and painful porokeratotic lesion(s) left foot and painful mycotic toenails that limit ambulation. Painful toenails interfere with ambulation. Aggravating factors include wearing enclosed shoe gear. Pain is relieved with periodic professional debridement. Painful porokeratotic lesions are aggravated when weightbearing with and without shoegear. Pain is relieved with periodic professional debridement.  He is requesting written prescription for diabetic shoes today. He would like to take written prescription to [facility] to get his shoes.  Patient does not monitor blood glucose on a daily basis.  PCP: [facility] and last visit was: 03/16/2021.  Review of Systems: Negative except as noted in the HPI.   Allergies  Allergen Reactions   Other Other (See Comments)    Non steroidal anti inflammatory analgesic - GI bleed    Objective:  There were no vitals filed for this visit. Constitutional Patient is a pleasant 70 y.o. African American male morbidly obese in NAD. AAO x 3.  Vascular Capillary fill time to digits <3 seconds b/l lower extremities. Palpable pedal pulses b/l LE. Pedal hair absent. Lower extremity skin temperature gradient within normal limits. No pain with calf compression b/l. Nonpitting edema noted b/l lower extremities. Varicosities present b/l. No cyanosis or clubbing noted.  Neurologic Normal speech. Protective sensation diminished with 10g monofilament b/l.  Dermatologic Pedal skin is thin shiny, atrophic b/l lower extremities. No open wounds b/l lower extremities. No interdigital macerations b/l lower extremities. Toenails 1-5 b/l elongated,  discolored, dystrophic, thickened, crumbly with subungual debris and tenderness to dorsal palpation. Hyperkeratotic lesion(s) submet head 3 right foot.  No erythema, no edema, no drainage, no fluctuance. Porokeratotic lesion(s) submet head 5 left foot. No erythema, no edema, no drainage, no fluctuance.  Orthopedic: Normal muscle strength 5/5 to all lower extremity muscle groups bilaterally. No pain crepitus or joint limitation noted with ROM b/l. Hammertoe(s) noted to the L hallux and R hallux. Utilizes rollator for ambulation assistance.     Assessment:   1. Pain due to onychomycosis of toenails of both feet   2. Porokeratosis   3. Callus   4. Diabetic peripheral neuropathy associated with type 2 diabetes mellitus ([place])     Plan:  -Mr. Biga was given written prescription for [facility] DME to dispense two pair of extra depth shoes with six pair of custom molded insoles. Offload submet head 5 lesion of left foot. Diagnoses: NIDDM with neuropathy and porokeratosis submet head 5 left foot. -Toenails 1-5 b/l were debrided in length and girth with sterile nail nippers and dremel without iatrogenic bleeding.  -Callus(es) submet head 3 right foot pared utilizing sterile scalpel blade without complication or incident. Total number debrided =1. -Painful porokeratotic lesion(s) submet head 5 left foot pared and enucleated with sterile scalpel blade without incident. Total number of lesions debrided=1. -Continue diabetic foot care principles. -Patient to report any pedal injuries to medical professional immediately. -Patient/POA to call should there be question/concern in the interim.  Return in about 6 weeks (around 05/01/2021).  Marzetta Board, DPM

## 2021-04-06 ENCOUNTER — Ambulatory Visit: Payer: No Typology Code available for payment source | Admitting: Podiatry

## 2021-04-15 ENCOUNTER — Ambulatory Visit: Payer: No Typology Code available for payment source | Admitting: Podiatry

## 2021-04-20 ENCOUNTER — Encounter: Payer: Self-pay | Admitting: Podiatry

## 2021-04-20 ENCOUNTER — Ambulatory Visit (INDEPENDENT_AMBULATORY_CARE_PROVIDER_SITE_OTHER): Payer: Medicare Other | Admitting: Podiatry

## 2021-04-20 ENCOUNTER — Other Ambulatory Visit: Payer: Self-pay

## 2021-04-20 DIAGNOSIS — Q828 Other specified congenital malformations of skin: Secondary | ICD-10-CM

## 2021-04-20 DIAGNOSIS — L84 Corns and callosities: Secondary | ICD-10-CM

## 2021-04-20 DIAGNOSIS — B351 Tinea unguium: Secondary | ICD-10-CM | POA: Diagnosis not present

## 2021-04-20 DIAGNOSIS — M79674 Pain in right toe(s): Secondary | ICD-10-CM | POA: Diagnosis not present

## 2021-04-20 DIAGNOSIS — M79675 Pain in left toe(s): Secondary | ICD-10-CM

## 2021-04-20 DIAGNOSIS — E1142 Type 2 diabetes mellitus with diabetic polyneuropathy: Secondary | ICD-10-CM

## 2021-04-20 MED ORDER — CICLOPIROX 8 % EX SOLN: CUTANEOUS | 11 refills | Status: DC

## 2021-04-20 NOTE — Progress Notes (Signed)
  Subjective:  Patient ID: Christopher Riley, male    DOB: [birth year],  MRN: [id number]  70 y.o. male presents with at risk foot care with history of diabetic neuropathy, callus(es) right foot. Aggravating factors include weightbearing with and without shoe gear. Pain is relieved with periodic professional debridement., and painful porokeratotic lesion(s) left foot and painful mycotic toenails that limit ambulation. Painful toenails interfere with ambulation. Aggravating factors include wearing enclosed shoe gear. Pain is relieved with periodic professional debridement. Painful porokeratotic lesions are aggravated when weightbearing with and without shoegear. Pain is relieved with periodic professional debridement.  [facility] and was told he needs to get the prescription re-written by his [facility] PCP.   Patient does not monitor blood glucose on a daily basis.  PCP: [facility] and last visit was: 04/14/2021  Review of Systems: Negative except as noted in the HPI.   Allergies  Allergen Reactions   Other Other (See Comments)    Non steroidal anti inflammatory analgesic - GI bleed    Objective:  There were no vitals filed for this visit. Constitutional Patient is a pleasant 70 y.o. African American male morbidly obese in NAD. AAO x 3.  Vascular Capillary fill time to digits <3 seconds b/l lower extremities. Palpable pedal pulses b/l LE. Pedal hair absent. Lower extremity skin temperature gradient within normal limits. No pain with calf compression b/l. Nonpitting edema noted b/l lower extremities. Varicosities present b/l. No cyanosis or clubbing noted.  Neurologic Normal speech. Protective sensation diminished with 10g monofilament b/l.  Dermatologic Pedal skin is thin shiny, atrophic b/l lower extremities. No open wounds b/l lower extremities. No interdigital macerations b/l lower extremities. Toenails 1-5 b/l elongated, discolored,  dystrophic, thickened, crumbly with subungual debris and tenderness to dorsal palpation. Hyperkeratotic lesion(s) submet head 3 right foot.  No erythema, no edema, no drainage, no fluctuance. Porokeratotic lesion(s) submet head 5 left foot. No erythema, no edema, no drainage, no fluctuance.  Orthopedic: Normal muscle strength 5/5 to all lower extremity muscle groups bilaterally. No pain crepitus or joint limitation noted with ROM b/l. Hammertoe(s) noted to the L hallux and R hallux. Utilizes rollator for ambulation assistance.     Assessment:   1. Pain due to onychomycosis of toenails of both feet   2. Porokeratosis   3. Callus   4. Diabetic peripheral neuropathy associated with type 2 diabetes mellitus ([place])     Plan:  -On our last visit, Mr. Vanoverbeke was given written prescription for [facility] DME to dispense two pair of extra depth shoes with six pair of custom molded insoles. He states he is to give the diabetic shoe Rx to his [facility] PCP for them to write the order  for his shoes/inserts. -Toenails 1-5 b/l were debrided in length and girth with sterile nail nippers and dremel without iatrogenic bleeding. Rx written for Ciclopirox Solution 1% to be applied to each toenail once daily and remove once weely with nail polish remover or alcohol. -Callus(es) submet head 3 right foot pared utilizing sterile scalpel blade without complication or incident. Total number debrided =1. -Painful porokeratotic lesion(s) submet head 5 left foot pared and enucleated with sterile scalpel blade without incident. Total number of lesions debrided=1. -Continue diabetic foot care principles. -Patient to report any pedal injuries to medical professional immediately. -Patient/POA to call should there be question/concern in the interim.  Return in about 6 weeks (around 06/01/2021).  Marzetta Board, DPM pe

## 2021-04-20 NOTE — Patient Instructions (Signed)
Ciclopirox nail solution What is this medication? CICLOPIROX (sye kloe PEER ox) NAIL SOLUTION is an antifungal medicine. It used to treat fungal infections of the nails. This medicine may be used for other purposes; ask your health care provider or pharmacist if you have questions. COMMON BRAND NAME(S): Ciclodan, CNL8, Penlac What should I tell my care team before I take this medication? They need to know if you have any of these conditions: diabetes mellitus history of seizures HIV infection immune system problems or organ transplant large areas of burned or damaged skin peripheral vascular disease or poor circulation taking corticosteroid medication (including steroid inhalers, cream, or lotion) an unusual or allergic reaction to ciclopirox, isopropyl alcohol, other medicines, foods, dyes, or preservatives pregnant or trying to get pregnant breast-feeding How should I use this medication? This medicine is for external use only. Follow the directions that come with this medicine exactly. Wash and dry your hands before use. Avoid contact with the eyes, mouth or nose. If you do get this medicine in your eyes, rinse out with plenty of cool tap water. Contact your doctor or health care professional if eye irritation occurs. Use at regular intervals. Do not use your medicine more often than directed. Finish the full course prescribed by your doctor or health care professional even if you think you are better. Do not stop using except on your doctor's advice. Talk to your pediatrician regarding the use of this medicine in children. While this medicine may be prescribed for children as young as 12 years for selected conditions, precautions do apply. Overdosage: If you think you have taken too much of this medicine contact a poison control center or emergency room at once. NOTE: This medicine is only for you. Do not share this medicine with others. What if I miss a dose? If you miss a dose, use it as  soon as you can. If it is almost time for your next dose, use only that dose. Do not use double or extra doses. What may interact with this medication? Interactions are not expected. Do not use any other skin products without telling your doctor or health care professional. This list may not describe all possible interactions. Give your health care provider a list of all the medicines, herbs, non-prescription drugs, or dietary supplements you use. Also tell them if you smoke, drink alcohol, or use illegal drugs. Some items may interact with your medicine. What should I watch for while using this medication? Tell your doctor or health care professional if your symptoms get worse. Four to six months of treatment may be needed for the nail(s) to improve. Some people may not achieve a complete cure or clearing of the nails by this time. Tell your doctor or health care professional if you develop sores or blisters that do not heal properly. If your nail infection returns after stopping using this product, contact your doctor or health care professional. What side effects may I notice from receiving this medication? Side effects that you should report to your doctor or health care professional as soon as possible: allergic reactions like skin rash, itching or hives, swelling of the face, lips, or tongue severe irritation, redness, burning, blistering, peeling, swelling, oozing Side effects that usually do not require medical attention (report to your doctor or health care professional if they continue or are bothersome): mild reddening of the skin nail discoloration temporary burning or mild stinging at the site of application This list may not describe all possible side effects.  Call your doctor for medical advice about side effects. You may report side effects to FDA at 1-800-FDA-1088. Where should I keep my medication? Keep out of the reach of children. Store at room temperature between 15 and 30  degrees C (59 and 86 degrees F). Do not freeze. Protect from light by storing the bottle in the carton after every use. This medicine is flammable. Keep away from heat and flame. Throw away any unused medicine after the expiration date. NOTE: This sheet is a summary. It may not cover all possible information. If you have questions about this medicine, talk to your doctor, pharmacist, or health care provider.  2022 Elsevier/Gold Standard (2007-10-16 16:49:20)

## 2021-04-30 ENCOUNTER — Other Ambulatory Visit: Payer: Self-pay

## 2021-04-30 ENCOUNTER — Encounter (HOSPITAL_COMMUNITY): Payer: Self-pay

## 2021-04-30 ENCOUNTER — Emergency Department (HOSPITAL_COMMUNITY)
Admission: EM | Admit: 2021-04-30 | Discharge: 2021-04-30 | Disposition: A | Discharge status: Home / Self Care | Payer: No Typology Code available for payment source | Attending: Emergency Medicine | Admitting: Emergency Medicine

## 2021-04-30 DIAGNOSIS — Z7984 Long term (current) use of oral hypoglycemic drugs: Secondary | ICD-10-CM | POA: Insufficient documentation

## 2021-04-30 DIAGNOSIS — Z8546 Personal history of malignant neoplasm of prostate: Secondary | ICD-10-CM | POA: Insufficient documentation

## 2021-04-30 DIAGNOSIS — G8929 Other chronic pain: Secondary | ICD-10-CM | POA: Diagnosis not present

## 2021-04-30 DIAGNOSIS — Z79899 Other long term (current) drug therapy: Secondary | ICD-10-CM | POA: Diagnosis not present

## 2021-04-30 DIAGNOSIS — Z96651 Presence of right artificial knee joint: Secondary | ICD-10-CM | POA: Insufficient documentation

## 2021-04-30 DIAGNOSIS — M25562 Pain in left knee: Secondary | ICD-10-CM | POA: Insufficient documentation

## 2021-04-30 DIAGNOSIS — J45909 Unspecified asthma, uncomplicated: Secondary | ICD-10-CM | POA: Insufficient documentation

## 2021-04-30 DIAGNOSIS — I10 Essential (primary) hypertension: Secondary | ICD-10-CM | POA: Insufficient documentation

## 2021-04-30 DIAGNOSIS — M545 Low back pain, unspecified: Secondary | ICD-10-CM | POA: Diagnosis not present

## 2021-04-30 DIAGNOSIS — E119 Type 2 diabetes mellitus without complications: Secondary | ICD-10-CM | POA: Insufficient documentation

## 2021-04-30 DIAGNOSIS — M25561 Pain in right knee: Secondary | ICD-10-CM | POA: Insufficient documentation

## 2021-04-30 MED ORDER — METHYLPREDNISOLONE SODIUM SUCC 125 MG IJ SOLR
125.0000 mg | Freq: Once | INTRAMUSCULAR | Status: AC
  Administered 2021-04-30: 125 mg via INTRAMUSCULAR
  Filled 2021-04-30: qty 2

## 2021-04-30 MED ORDER — OXYCODONE HCL 5 MG PO TABS: 5.0000 mg | ORAL_TABLET | Freq: Four times a day (QID) | ORAL | 0 refills | Status: DC | PRN

## 2021-04-30 MED ORDER — PREDNISONE 20 MG PO TABS: ORAL_TABLET | ORAL | 0 refills | Status: DC

## 2021-04-30 NOTE — ED Provider Notes (Signed)
[facility] DEPT Provider Note   CSN: [id number] Arrival date & time: 04/30/21  0343     History Chief Complaint  Patient presents with   Body Pain    Christopher Riley is a 70 y.o. male history of diabetes, chronic back and knee pain, previous knee replacement here presenting with back and knee pain.  Patient states that he follows up with the [place] [facility].  He has chronic back pain and knee pain.  He states that he is not currently on any pain medicine.  Patient states that for the last several weeks is progressively got worse.  He states that he had trouble sleeping last night due to the pain.  Denies any abdominal pain or chest pain.  Denies any numbness or weakness.  Patient uses walker at baseline and he is able to walk with his walker.  He in fact drove here.  Patient denies any fevers or chills.  He has followed up with [facility] for his knees and back previously and was told that no surgical options are available for him at this point and I just recommend pain medicine and steroid injections.  The history is provided by the patient.      Past Medical History:  Diagnosis Date   Arthritis    KNEES AND ANKLES   BPH (benign prostatic hypertrophy)    Chronic pain    back, knees   Crutches as ambulation aid    Depression    Diabetic peripheral neuropathy (HCC)    Erectile dysfunction    GERD (gastroesophageal reflux disease)    Glaucoma    BILATERAL   H/O agent Orange exposure    History of adenomatous polyp of colon    2008  &  2010       History of gastric ulcer    History of lower GI bleeding    2011--  POST COLONOSCOPY W/ POLYPECTOMY/    AND SEVERAL TIMES SINCE SECONDARY TO HEMORRHOIDS   History of prostate cancer    2009  ---S/P  EXTERNAL RADIATION THERAPY   History of seizure    1980'S--  SECONDARY TO MEDICATION--  NO ISSUE SINCE   Hyperlipidemia    Hypertension    Impaired hearing    BILATERAL   Mild asthma    Nocturia    OSA  (obstructive sleep apnea)    NON-COMPLIANT CPAP--  SLEEP STUDY 2013  at  [place] in [place] traumatic stress disorder (PTSD)    HISTORY SUIDICE ATTEMPT AFTER [place]   Scrotal edema    Type 2 diabetes mellitus (HCC)    Urinary incontinence    Vision loss of right eye    Wears glasses     Patient Active Problem List   Diagnosis Date Noted   Body mass index (BMI) 40.0-44.9, adult ([place]) 03/20/2021   Morbid obesity ([employer]) 03/20/2021   Xerosis cutis 03/20/2021   Asymptomatic varicose veins 01/23/2021   Barrett's esophagus 01/23/2021   Bilateral primary osteoarthritis of knee 01/23/2021   Cataract 01/23/2021   Dependence on continuous positive airway pressure ventilation 01/23/2021   Difficulty in walking, not elsewhere classified 01/23/2021   Disorder of the skin and subcutaneous tissue, unspecified 01/23/2021   Elevated prostate specific antigen (PSA) 01/23/2021   Encounter for dental examination 01/23/2021   Enthesopathy of ankle and tarsus 01/23/2021   First degree hemorrhoids 01/23/2021   Gastritis and gastroduodenitis with hemorrhage 01/23/2021   Generalized osteoarthrosis, involving multiple sites 01/23/2021   Hemorrhage of  anus and rectum 01/23/2021   History of falling 01/23/2021   Hypersomnia with sleep apnea 01/23/2021   Lymphedema, not elsewhere classified 01/23/2021   Microscopic hematuria 01/23/2021   Muscle weakness (generalized) 01/23/2021   Other abnormalities of gait and mobility 01/23/2021   Other joint derangement not elsewhere classified of lower leg 01/23/2021   Other reduced mobility 01/23/2021   Pain in right knee 01/23/2021   Other chronic pain 01/23/2021   Polyp of colon 01/23/2021   Presbyopia 01/23/2021   Presence of right artificial knee joint 01/23/2021   Primary open-angle glaucoma, right eye, severe stage 01/23/2021   Problem related to unspecified psychosocial circumstances 01/23/2021   Type 2 diabetes mellitus with hyperglycemia ([place])  01/23/2021   Type 2 diabetes mellitus without complications ([place]) 96/[id number]/9381   Urinary incontinence 01/23/2021   Calculus of kidney 01/23/2021   Plantar flexed metatarsal bone of left foot 01/23/2021   Intertrigo 08/03/2016   History of nephrolithiasis 02/18/2016   Scrotal swelling 06/27/2013   Chest pain 09/27/2012   Fatigue 08/15/2012   Neuropathy 03/22/2011   WEAKNESS 06/30/2010   ANKLE PAIN, RIGHT 04/02/2010   CONSTIPATION 08/28/2009   ANEMIA, SECONDARY TO CHRONIC BLOOD LOSS 08/21/2009   HEMORRHOIDS, WITH BLEEDING 07/28/2009   NECK PAIN 02/19/2009   HYPERTENSION 01/16/2009   SOMNOLENCE 01/16/2009   Obstructive sleep apnea 01/16/2009   SHOULDER PAIN, RIGHT 07/12/2008   DIABETES MELLITUS, TYPE II 05/28/2008   HYPERLIPIDEMIA 03/22/2008   LOW BACK PAIN, CHRONIC 03/22/2008   ADENOCARCINOMA, PROSTATE 03/12/2008   OTHER ACUTE REACTIONS TO STRESS 03/12/2008   MEMORY LOSS 03/12/2008   PARESTHESIA 03/12/2008    Past Surgical History:  Procedure Laterality Date   CATARACT EXTRACTION     CLOSED MANIPULATION RIGHT SHOULDER  09-29-2009   COLONOSCOPY W/ HEMORRHOID SURGERY  APRIL 2015   [facility] [place]  [facility]-25-2009   OPEN LEFT KNEE PATELLA SURGERY  ,  [place] PROSTHESIS IMPLANT N/A 01/09/2013   Procedure: PENILE PROSTHESIS ;  Surgeon: Hanley Ben, MD;  Location: [facility];  Service: Urology;  Laterality: N/A;  Inflatable Penile Prosthesis Placement  (Coloplast) INFRAPUBIC APPROACH   RETINAL DETACHMENT SURGERY Right 2006   SCROTECTOMY N/A 12/28/2013   Procedure: SCROTOPLASTY;  Surgeon: Arvil Persons, MD;  Location: [facility];  Service: Urology;  Laterality: N/A;   SHOULDER ARTHROSCOPY DEBRIDEMENT PARTIAL ROTATOR CUFF AND LABRAL TEAR/  SAD/  DISTAL CLAVICLE RESECTION Right 07-01-2009   TOTAL KNEE ARTHROPLASTY Right 1998   TRANSURETHRAL RESECTION OF PROSTATE  11-05-2005       Family History  Problem Relation Age of Onset   Diabetes Brother     Other Other        no FH of colon cancer    Social History   Tobacco Use   Smoking status: Never   Smokeless tobacco: Never  Vaping Use   Vaping Use: Never used  Substance Use Topics   Alcohol use: No    Comment: none since 1988   Drug use: Not Currently    Comment: speed in [place] but never since    Home Medications Prior to Admission medications   Medication Sig Start Date End Date Taking? Authorizing Provider  oxyCODONE (ROXICODONE) 5 MG immediate release tablet Take 1 tablet (5 mg total) by mouth every 6 (six) hours as needed for severe pain. 04/30/21  Yes Drenda Freeze, MD  predniSONE (DELTASONE) 20 MG tablet Take 60 mg daily x 2 days then  40 mg daily x 2 days then 20 mg daily x 2 days 04/30/21  Yes Drenda Freeze, MD  acetaminophen (TYLENOL) 325 MG tablet TAKE THREE TABLETS BY MOUTH THREE TIMES A DAY AS NEEDED 12/05/20   [provider]  Acetaminophen 325 MG CAPS 1 capsule as needed    [provider]  albuterol (VENTOLIN HFA) 108 (90 Base) MCG/ACT inhaler Inhale 2 puffs into the lungs every 6 (six) hours as needed for wheezing or shortness of breath.    [provider]  Albuterol Sulfate (PROAIR RESPICLICK) 716 (90 Base) MCG/ACT AEPB 1 puff as needed    [provider]  ALPRAZolam (XANAX) 1 MG tablet Take 1 mg by mouth every 6 (six) hours as needed. 12/08/20   [provider]  alprazolam Duanne Moron) 2 MG tablet 1 tablet    [provider]  amLODipine (NORVASC) 10 MG tablet TAKE ONE-HALF TABLET BY MOUTH EVERY DAY FOR BLOOD PRESSURE 12/05/20 12/06/21  [provider]  amLODipine (NORVASC) 10 MG tablet TAKE ONE-HALF TABLET BY MOUTH EVERY DAY FOR BLOOD PRESSURE 12/05/20   [provider]  ascorbic acid (VITAMIN C) 250 MG tablet Vitamin C 250 mg tablet    [provider]  ASMANEX, 60 METERED DOSES, 220 MCG/INH inhaler Inhale 1 puff into the lungs daily as needed (for wheezing or shortness of breath).   01/22/19   [provider]  brimonidine (ALPHAGAN) 0.15 % ophthalmic solution INSTILL 1 DROP IN [facility] EYE TWO TIMES A DAY 12/16/20 09/04/21  [provider]  brimonidine (ALPHAGAN) 0.2 % ophthalmic solution Place 1 drop into both eyes at bedtime.    [provider]  brimonidine (ALPHAGAN) 0.2 % ophthalmic solution 1 drop into affected eye    [provider]  capsaicin (ZOSTRIX) 0.025 % cream Apply 1 application topically daily. 08/10/18   [provider]  Capsicum Oleoresin LIQD See admin instructions.    [provider]  carvedilol (COREG) 12.5 MG tablet 1 tablet with food    [provider]  ciclopirox (LOPROX) 0.77 % cream Apply to both feet and between toes bid x 28 days. Patient taking differently: Apply 1 application topically daily as needed (feet). Apply to both feet and between toes bid x 28 days. 02/05/18   Marzetta Board, DPM  ciclopirox (PENLAC) 8 % solution Apply one coat to toenail qd.  Remove weekly with polish remover. 04/20/21   Marzetta Board, DPM  clonazePAM (KLONOPIN) 2 MG tablet Take 2 mg by mouth daily as needed for anxiety. 07/12/18   [provider]  clonazePAM (KLONOPIN) 2 MG tablet Take 1 tablet by mouth at bedtime as needed. 09/30/20   [provider]  clonazePAM (KLONOPIN) 2 MG tablet 1 tablet    [provider]  clotrimazole (LOTRIMIN) 1 % cream APPLY MODERATE AMOUNT TOPICALLY TWO TIMES A DAY 03/16/21   [provider]  COMBIVENT RESPIMAT 20-100 MCG/ACT AERS respimat Inhale 1 puff into the lungs every 6 (six) hours as needed for wheezing or shortness of breath. 06/27/18   [provider]  Cyanocobalamin (VITAMIN B 12 PO) Take 1 tablet by mouth daily.    [provider]  cyclobenzaprine (FLEXERIL) 10 MG tablet cyclobenzaprine 10 mg tablet    [provider]  cyclobenzaprine (FLEXERIL) 5 MG tablet TAKE ONE TABLET BY MOUTH TWO TIMES A DAY AS NEEDED  FOR MUSCLE SPASMS. MAY MAKE YOU DROWSY. 09/19/20 09/20/21  [provider]  cyclobenzaprine (FLEXERIL) 5 MG tablet  Take 5 mg by mouth 2 (two) times daily. 09/19/20   [provider]  diclofenac Sodium (VOLTAREN) 1 % GEL Apply 2 g topically daily as needed (pain). 06/08/19   [provider]  docusate sodium (COLACE) 100 MG capsule Take 100 mg by mouth 2 (two) times daily as needed for mild constipation.    [provider]  escitalopram (LEXAPRO) 20 MG tablet escitalopram 20 mg tablet  TAKE 1 TABLET BY MOUTH ONCE DAILY IN THE MORNING    [provider]  ferrous sulfate 325 (65 FE) MG tablet FeroSul 325 mg (65 mg iron) tablet    [provider]  furosemide (LASIX) 20 MG tablet Take 20 mg by mouth daily.    [provider]  gabapentin (NEURONTIN) 100 MG capsule Take 100 mg by mouth daily. 10/30/18   [provider]  gabapentin (NEURONTIN) 300 MG capsule TAKE ONE CAPSULE BY MOUTH EVERY 8 HOURS FOR NERVE PAIN 12/16/20 12/17/21  [provider]  gabapentin (NEURONTIN) 300 MG capsule TAKE ONE CAPSULE BY MOUTH EVERY 8 HOURS FOR NERVE PAIN 12/16/20   [provider]  HYDROcodone-acetaminophen (NORCO/VICODIN) 5-325 MG per tablet Take 1 tablet by mouth every 6 (six) hours as needed for moderate pain.    [provider]  HYDROPHILIC EX APPLY MODERATE AMOUNT TOPICALLY EVERY DAY 03/08/21   [provider]  ketoconazole (NIZORAL) 2 % cream Apply 1 application topically daily. Apply to both feet and between toes once daily for 6 weeks Patient taking differently: Apply 1 application topically daily as needed for irritation. 10/04/18   Marzetta Board, DPM  latanoprost (XALATAN) 0.005 % ophthalmic solution Place 1 drop into both eyes at bedtime.    [provider]  latanoprost (XALATAN) 0.005 % ophthalmic solution Apply to eye. 09/03/20   [provider]  lidocaine-prilocaine (EMLA) cream Apply 1  application topically daily as needed (pain).    [provider]  meloxicam (MOBIC) 15 MG tablet meloxicam 15 mg tablet    [provider]  meloxicam (MOBIC) 15 MG tablet TAKE ONE-HALF TABLET BY MOUTH ONCE DAILY AFTER A MEAL TAKE WITH FOOD. FOR PAIN AND INFLAMMATION 02/05/21   [provider]  Menthol-Methyl Salicylate (THERA-GESIC) 1-15 % CREA Apply 1 application topically in the morning and at bedtime.    [provider]  metFORMIN (GLUCOPHAGE) 1000 MG tablet Take 1 tablet (1,000 mg total) by mouth 2 (two) times daily with a meal. 06/27/13   Yoo, Doe-Hyun R, DO  metFORMIN (GLUCOPHAGE-XR) 500 MG 24 hr tablet TAKE FOUR TABLETS BY MOUTH BEFORE DINNER FOR DIABETES * NEW METFORMIN - MAY TAKE WITH LARGEST MEAL* 09/30/20 10/01/21  [provider]  metFORMIN (GLUCOPHAGE-XR) 500 MG 24 hr tablet TAKE FOUR TABLETS BY MOUTH BEFORE DINNER FOR DIABETES * NEW METFORMIN - MAY TAKE WITH LARGEST MEAL* 09/30/20   [provider]  methylPREDNISolone (MEDROL DOSEPAK) 4 MG TBPK tablet Take taper as prescribed 11/06/20   Kinnie Feil, PA-C  Mometasone Furoate [facility] HFA) 200 MCG/ACT AERO 2 puffs    [provider]  naproxen (NAPROSYN) 500 MG tablet Take 1 tablet (500 mg total) by mouth 2 (two) times daily. 11/06/20   Kinnie Feil, PA-C  NARCAN 4 MG/0.1ML LIQD nasal spray kit SMARTSIG:Spray(s) Both Nares 09/19/20   [provider]  omeprazole (PRILOSEC) 20 MG capsule Take 20 mg by mouth daily.    [provider]  Pramoxine-HC (PRAMOSONE) 1-2.5 % OINT Pramosone 2.5 %- 1 % topical  ointment    [provider]  psyllium (METAMUCIL) 58.6 % powder Take 1 packet by mouth daily as needed (constipation).    [provider]  QUEtiapine (SEROQUEL) 200 MG tablet Take 200 mg by mouth at bedtime. 08/05/18   [provider]  sildenafil (VIAGRA) 100 MG tablet TAKE ONE TABLET BY MOUTH AS DIRECTED AS NEEDED FOR ERECTILE DYSFUNCTION  ;1 HOUR BEFORE SEXUAL ACTIVITY. DO NOT TAKE WITHIN 6 HOURS OF TERAZOSIN, PRAZOSIN, ALFUZOSIN OR DOXAZOSIN. DO NOT TAKE MORE THAN 09/30/20 10/01/21  [provider]  sildenafil (VIAGRA) 100 MG tablet TAKE ONE TABLET BY MOUTH AS DIRECTED AS NEEDED FOR ERECTILE DYSFUNCTION ;1 HOUR BEFORE SEXUAL ACTIVITY. DO NOT TAKE WITHIN 6 HOURS OF TERAZOSIN, PRAZOSIN, ALFUZOSIN OR DOXAZOSIN. DO NOT TAKE MORE THAN 1 DOSE WITHIN 24 HOURS . DO NOT TAKE WITH NITRATES 09/30/20   [provider]  tamsulosin (FLOMAX) 0.4 MG CAPS capsule TAKE ONE CAPSULE BY MOUTH EVERY DAY FOR BENIGN PROSTATIC HYPERPLASIA 09/12/20 09/13/21  [provider]  tamsulosin (FLOMAX) 0.4 MG CAPS capsule Take 0.4 mg by mouth daily. 09/14/20   [provider]  tiZANidine (ZANAFLEX) 4 MG tablet TAKE ONE TABLET BY MOUTH THREE TIMES DAILY Patient not taking: No sig reported 05/03/16   Martinique, Betty G, MD  traZODone (DESYREL) 50 MG tablet Take 50 mg by mouth daily as needed for sleep. Patient stated that he is to take 8 tablets at bedtime    [provider]    Allergies    Other  Review of Systems   Review of Systems  Musculoskeletal:  Positive for back pain.       Bilateral knee pain  All other systems reviewed and are negative.  Physical Exam Updated Vital Signs BP (!) 183/86 (BP Location: Left Arm)   Pulse 82   Temp 98.7 F (37.1 C) (Oral)   Resp 17   Ht $R'6\' 2"'JR$  (1.88 m)   Wt (!) 147.4 kg   SpO2 95%   BMI 41.73 kg/m   Physical Exam Vitals and nursing note reviewed.  Constitutional:      Appearance: Normal appearance.     Comments: Slightly uncomfortable  HENT:     Head: Normocephalic.     Nose: Nose normal.     Mouth/Throat:     Mouth: Mucous membranes are moist.  Eyes:     Extraocular Movements: Extraocular movements intact.     Pupils: Pupils are equal, round, and reactive to light.  Cardiovascular:     Rate and Rhythm: Normal rate and regular rhythm.     Pulses: Normal pulses.      Heart sounds: Normal heart sounds.  Pulmonary:     Effort: Pulmonary effort is normal.     Breath sounds: Normal breath sounds.  Abdominal:     General: Abdomen is flat.     Palpations: Abdomen is soft.  Musculoskeletal:     Cervical back: Normal range of motion and neck supple.     Comments: Bilateral paralumbar tenderness.  No midline tenderness.  Patient has no saddle anesthesia.  Patient has surgical scars on bilateral knees that are healing well.  Patient is able to range both knees and there is no obvious knee effusion.  Patient is able to ambulate with his walker.  Neurological:     General: No focal deficit present.     Mental Status: He is alert and oriented to person, place, and time.  Psychiatric:        Mood  and Affect: Mood normal.        Behavior: Behavior normal.    ED Results / Procedures / Treatments   Labs (all labs ordered are listed, but only abnormal results are displayed) Labs Reviewed - No data to display  EKG None  Radiology No results found.  Procedures Procedures   Medications Ordered in ED Medications  methylPREDNISolone sodium succinate (SOLU-MEDROL) 125 mg/2 mL injection 125 mg (has no administration in time range)    ED Course  I have reviewed the triage vital signs and the nursing notes.  Pertinent labs & imaging results that were available during my care of the patient were reviewed by me and considered in my medical decision making (see chart for details).    MDM Rules/Calculators/A&P                           Christopher Riley is a 70 y.o. male here presenting with back pain and bilateral knee pain.  This is a chronic issue.  Patient says pain has been getting worse over the last several weeks.  Patient has no saddle anesthesia or weakness to suggest radiculopathy and does not need any MRI right now.  Patient also has no trauma or injury so we will hold off on x-rays.  Patient request steroid injection and a course of steroids.  I looked  up the narcotic database and he is not currently on any pain medicine so we will prescribe some pain medicine and have him see [facility] over his doctor at the [place].   Final Clinical Impression(s) / ED Diagnoses Final diagnoses:  Other chronic pain  Chronic bilateral low back pain without sciatica  Chronic pain of both knees    Rx / DC Orders ED Discharge Orders          Ordered    predniSONE (DELTASONE) 20 MG tablet        04/30/21 0614    oxyCODONE (ROXICODONE) 5 MG immediate release tablet  Every 6 hours PRN        04/30/21 0614             Drenda Freeze, MD 04/30/21 [phone]

## 2021-04-30 NOTE — ED Triage Notes (Signed)
Pt complains of chronic body pain that is getting worse. Pt states that his back, ankles, and knees hurt.

## 2021-04-30 NOTE — Discharge Instructions (Addendum)
You have chronic pain.  Please take prednisone as prescribed.  You can either follow-up at [facility] or with [facility] for your back and knee problems.  Continue to take Tylenol for pain.  Take oxycodone for severe pain  Return to ER if you have worse back pain, knee pain, unable to walk, trouble urinating, numbness or weakness

## 2021-05-01 ENCOUNTER — Ambulatory Visit: Payer: No Typology Code available for payment source | Admitting: Podiatry

## 2021-05-18 ENCOUNTER — Ambulatory Visit (INDEPENDENT_AMBULATORY_CARE_PROVIDER_SITE_OTHER): Payer: Medicare Other | Admitting: Podiatry

## 2021-05-18 ENCOUNTER — Encounter: Payer: Self-pay | Admitting: Podiatry

## 2021-05-18 ENCOUNTER — Other Ambulatory Visit: Payer: Self-pay

## 2021-05-18 DIAGNOSIS — Q828 Other specified congenital malformations of skin: Secondary | ICD-10-CM

## 2021-05-18 DIAGNOSIS — E1142 Type 2 diabetes mellitus with diabetic polyneuropathy: Secondary | ICD-10-CM | POA: Diagnosis not present

## 2021-05-24 NOTE — Progress Notes (Signed)
  Subjective:  Patient ID: Christopher Riley, male    DOB: [birth year],  MRN: [id number]  Christopher Riley presents to clinic today for painful porokeratotic lesions left foot.  Pain prevent comfortable ambulation. Aggravating factor is weightbearing with or without shoegear.  Patient notes no other pedal concerns on today's visit.  [facility]P is [facility] , and last visit was 04/14/2021.  Allergies  Allergen Reactions   Other Other (See Comments)    Non steroidal anti inflammatory analgesic - GI bleed    Review of Systems: Negative except as noted in the HPI. Objective:   Constitutional Christopher Riley is a pleasant 70 y.o. African American male, morbidly obese in NAD. AAO x 3.   Vascular CFT <3 seconds b/l LE. Palpable DP pulse(s) b/l lower extremities Palpable PT pulse(s) b/l lower extremities Pedal hair absent. Lower extremity skin temperature gradient within normal limits. No pain with calf compression RLE. Nonpitting edema noted BLE. Varicosities present b/l. No cyanosis or clubbing noted.  Neurologic Normal speech. Oriented to person, place, and time. Protective sensation diminished with 10g monofilament b/l.  Dermatologic Pedal skin is thin shiny, atrophic b/l lower extremities. No open wounds b/l LE. No interdigital macerations b/l lower extremities. Toenails recently debrided. Porokeratotic lesion(s) submet head 5 left foot. No erythema, no edema, no drainage, no fluctuance.  Orthopedic: Normal muscle strength 5/5 to all lower extremity muscle groups bilaterally. Hammertoe(s) noted to the L hallux and R hallux. Utilizes rollator for ambulation assistance.   Radiographs: None Assessment:   1. Porokeratosis   2. Diabetic peripheral neuropathy associated with type 2 diabetes mellitus ([place])    Plan:  Patient was evaluated and treated and all questions answered. Consent given for treatment as described below: -No new findings. No new orders. -Continue diabetic foot care  principles: inspect feet daily, monitor glucose as recommended by PCP and/or Endocrinologist, and follow prescribed diet per PCP, Endocrinologist and/or dietician. -Painful porokeratotic lesion(s) submet head 5 left foot pared and enucleated with sterile scalpel blade without incident. Total number of lesions debrided=1. -Iatrogenic laceration sustained during debridement of submet head 5 lesion left foot.  Treated with Lumicain Hemostatic Solution and alcohol. Patient instructed to apply Neosporin to left foot once daily for 7 days. -Patient/POA to call should there be question/concern in the interim.  Return 06/05/2021 for toenail debridement.  Marzetta Board, DPM

## 2021-05-27 ENCOUNTER — Encounter (HOSPITAL_COMMUNITY): Payer: Self-pay

## 2021-05-27 ENCOUNTER — Other Ambulatory Visit: Payer: Self-pay

## 2021-05-27 ENCOUNTER — Emergency Department (HOSPITAL_COMMUNITY)
Admission: EM | Admit: 2021-05-27 | Discharge: 2021-05-27 | Disposition: A | Discharge status: Home / Self Care | Payer: No Typology Code available for payment source | Attending: Emergency Medicine | Admitting: Emergency Medicine

## 2021-05-27 DIAGNOSIS — M545 Low back pain, unspecified: Secondary | ICD-10-CM | POA: Insufficient documentation

## 2021-05-27 DIAGNOSIS — Z79899 Other long term (current) drug therapy: Secondary | ICD-10-CM | POA: Diagnosis not present

## 2021-05-27 DIAGNOSIS — I10 Essential (primary) hypertension: Secondary | ICD-10-CM | POA: Diagnosis not present

## 2021-05-27 DIAGNOSIS — J45909 Unspecified asthma, uncomplicated: Secondary | ICD-10-CM | POA: Insufficient documentation

## 2021-05-27 DIAGNOSIS — E1140 Type 2 diabetes mellitus with diabetic neuropathy, unspecified: Secondary | ICD-10-CM | POA: Insufficient documentation

## 2021-05-27 DIAGNOSIS — G8929 Other chronic pain: Secondary | ICD-10-CM | POA: Diagnosis not present

## 2021-05-27 DIAGNOSIS — M25562 Pain in left knee: Secondary | ICD-10-CM | POA: Diagnosis not present

## 2021-05-27 DIAGNOSIS — Z8546 Personal history of malignant neoplasm of prostate: Secondary | ICD-10-CM | POA: Diagnosis not present

## 2021-05-27 DIAGNOSIS — M25561 Pain in right knee: Secondary | ICD-10-CM | POA: Diagnosis not present

## 2021-05-27 DIAGNOSIS — Z7984 Long term (current) use of oral hypoglycemic drugs: Secondary | ICD-10-CM | POA: Insufficient documentation

## 2021-05-27 MED ORDER — DEXAMETHASONE SODIUM PHOSPHATE 4 MG/ML IJ SOLN
4.0000 mg | Freq: Once | INTRAMUSCULAR | Status: AC
  Administered 2021-05-27: 4 mg via INTRAMUSCULAR
  Filled 2021-05-27: qty 1

## 2021-05-27 NOTE — Discharge Instructions (Signed)
Continue to take your multimodal pain control: Tylenol, Naprosyn, Flexeril, and Suboxone.  Follow-up with your [facility] clinic or with PCP for management of chronic pain.

## 2021-05-27 NOTE — ED Provider Notes (Signed)
[facility] DEPT Provider Note   CSN: [id number] Arrival date & time: 05/27/21  0348     History Chief Complaint  Patient presents with   Knee Pain   Back Pain    Christopher Riley is a 70 y.o. male.   Knee Pain Associated symptoms: back pain (Chronic)   Associated symptoms: no fatigue, no fever and no neck pain   Back Pain Associated symptoms: no abdominal pain, no chest pain, no dysuria, no fever, no headaches, no numbness and no weakness   Patient presents for chronic lower back pain and chronic bilateral knee pain.  He is seen by the [name] who recently prescribed him Suboxone.  He has been taking this over the past couple days.  He has also been taking multimodal pain control that includes Tylenol, Naprosyn, Flexeril, and tramadol.  He denies any new or recent injuries.  He denies any changes to the characteristics of the pain.  He does state that the pain is getting worse.  He was seen in the ED on 10/9 for the same symptoms.  At that time, he got a steroid injection which she says helped him for a few days.  He presents to the ED today for another steroid shot.  Following his last visit in the ED he was given a short course of prednisone.  He has not been taking any prednisone since that time.  Other than his chronic pain, he denies any current complaints at this time.    Past Medical History:  Diagnosis Date   Arthritis    KNEES AND ANKLES   BPH (benign prostatic hypertrophy)    Chronic pain    back, knees   Crutches as ambulation aid    Depression    Diabetic peripheral neuropathy (HCC)    Erectile dysfunction    GERD (gastroesophageal reflux disease)    Glaucoma    BILATERAL   H/O agent [place] exposure    History of adenomatous polyp of colon    2008  &  2010       History of gastric ulcer    History of lower GI bleeding    2011--  POST COLONOSCOPY W/ POLYPECTOMY/    AND SEVERAL TIMES SINCE SECONDARY TO HEMORRHOIDS   History of prostate  cancer    2009  ---S/P  EXTERNAL RADIATION THERAPY   History of seizure    1980'S--  SECONDARY TO MEDICATION--  NO ISSUE SINCE   Hyperlipidemia    Hypertension    Impaired hearing    BILATERAL   Mild asthma    Nocturia    OSA (obstructive sleep apnea)    NON-COMPLIANT CPAP--  SLEEP STUDY 2013  at  [place] in [place] traumatic stress disorder (PTSD)    HISTORY SUIDICE ATTEMPT AFTER [place]   Scrotal edema    Type 2 diabetes mellitus (HCC)    Urinary incontinence    Vision loss of right eye    Wears glasses     Patient Active Problem List   Diagnosis Date Noted   Body mass index (BMI) 40.0-44.9, adult ([place]) 03/20/2021   Morbid obesity ([place]) 03/20/2021   Xerosis cutis 03/20/2021   Asymptomatic varicose veins 01/23/2021   Barrett's esophagus 01/23/2021   Bilateral primary osteoarthritis of knee 01/23/2021   Cataract 01/23/2021   Dependence on continuous positive airway pressure ventilation 01/23/2021   Difficulty in walking, not elsewhere classified 01/23/2021   Disorder of the skin and subcutaneous tissue, unspecified 01/23/2021  Elevated prostate specific antigen (PSA) 01/23/2021   Encounter for dental examination 01/23/2021   Enthesopathy of ankle and tarsus 01/23/2021   First degree hemorrhoids 01/23/2021   Gastritis and gastroduodenitis with hemorrhage 01/23/2021   Generalized osteoarthrosis, involving multiple sites 01/23/2021   Hemorrhage of anus and rectum 01/23/2021   History of falling 01/23/2021   Hypersomnia with sleep apnea 01/23/2021   Lymphedema, not elsewhere classified 01/23/2021   Microscopic hematuria 01/23/2021   Muscle weakness (generalized) 01/23/2021   Other abnormalities of gait and mobility 01/23/2021   Other joint derangement not elsewhere classified of lower leg 01/23/2021   Other reduced mobility 01/23/2021   Pain in right knee 01/23/2021   Other chronic pain 01/23/2021   Polyp of colon 01/23/2021   Presbyopia 01/23/2021   Presence of  right artificial knee joint 01/23/2021   Primary open-angle glaucoma, right eye, severe stage 01/23/2021   Problem related to unspecified psychosocial circumstances 01/23/2021   Type 2 diabetes mellitus with hyperglycemia ([place]) 01/23/2021   Type 2 diabetes mellitus without complications ([place]) 28/78/6767   Urinary incontinence 01/23/2021   Calculus of kidney 01/23/2021   Plantar flexed metatarsal bone of left foot 01/23/2021   Intertrigo 08/03/2016   History of nephrolithiasis 02/18/2016   Scrotal swelling 06/27/2013   Chest pain 09/27/2012   Fatigue 08/15/2012   Neuropathy 03/22/2011   WEAKNESS 06/30/2010   ANKLE PAIN, RIGHT 04/02/2010   CONSTIPATION 08/28/2009   ANEMIA, SECONDARY TO CHRONIC BLOOD LOSS 08/21/2009   HEMORRHOIDS, WITH BLEEDING 07/28/2009   NECK PAIN 02/19/2009   HYPERTENSION 01/16/2009   SOMNOLENCE 01/16/2009   Obstructive sleep apnea 01/16/2009   SHOULDER PAIN, RIGHT 07/12/2008   DIABETES MELLITUS, TYPE II 05/28/2008   HYPERLIPIDEMIA 03/22/2008   LOW BACK PAIN, CHRONIC 03/22/2008   ADENOCARCINOMA, PROSTATE 03/12/2008   OTHER ACUTE REACTIONS TO STRESS 03/12/2008   MEMORY LOSS 03/12/2008   PARESTHESIA 03/12/2008    Past Surgical History:  Procedure Laterality Date   CATARACT EXTRACTION     CLOSED MANIPULATION RIGHT SHOULDER  09-29-2009   COLONOSCOPY W/ HEMORRHOID SURGERY  APRIL 2015   [facility] [place]  [facility]25-2009   OPEN LEFT KNEE PATELLA SURGERY  ,  [place] PROSTHESIS IMPLANT N/A 01/09/2013   Procedure: PENILE PROSTHESIS ;  Surgeon: Hanley Ben, MD;  Location: [facility];  Service: Urology;  Laterality: N/A;  Inflatable Penile Prosthesis Placement  (Coloplast) INFRAPUBIC APPROACH   RETINAL DETACHMENT SURGERY Right 2006   SCROTECTOMY N/A 12/28/2013   Procedure: SCROTOPLASTY;  Surgeon: Arvil Persons, MD;  Location: [facility];  Service: Urology;  Laterality: N/A;   SHOULDER ARTHROSCOPY DEBRIDEMENT PARTIAL ROTATOR CUFF AND  LABRAL TEAR/  SAD/  DISTAL CLAVICLE RESECTION Right 07-01-2009   TOTAL KNEE ARTHROPLASTY Right 1998   TRANSURETHRAL RESECTION OF PROSTATE  11-05-2005       Family History  Problem Relation Age of Onset   Diabetes Brother    Other Other        no FH of colon cancer    Social History   Tobacco Use   Smoking status: Never   Smokeless tobacco: Never  Vaping Use   Vaping Use: Never used  Substance Use Topics   Alcohol use: No    Comment: none since 1988   Drug use: Not Currently    Comment: speed in [place] but never since    Home Medications Prior to Admission medications   Medication Sig Start Date End Date Taking? Authorizing  Provider  acetaminophen (TYLENOL) 325 MG tablet Take 325 mg by mouth 3 (three) times daily as needed for mild pain. 12/05/20  Yes [provider]  ALPRAZolam Duanne Moron) 1 MG tablet Take 1 mg by mouth every 6 (six) hours as needed for anxiety. 12/08/20  Yes [provider]  atorvastatin (LIPITOR) 80 MG tablet Take 40 mg by mouth daily. 03/23/21  Yes [provider]  brimonidine (ALPHAGAN) 0.15 % ophthalmic solution Place 1 drop into both eyes 2 (two) times daily. 12/16/20 09/04/21 Yes [provider]  Buprenorphine HCl (BELBUCA) 450 MCG FILM Place 1 Film under the tongue 2 (two) times daily.   Yes [provider]  ciclopirox (LOPROX) 0.77 % cream Apply to both feet and between toes bid x 28 days. Patient taking differently: Apply 1 application topically daily as needed (feet). Apply to both feet and between toes bid x 28 days. 02/05/18  Yes Marzetta Board, DPM  clotrimazole (LOTRIMIN) 1 % cream Apply 1 application topically 2 (two) times daily. 03/16/21  Yes [provider]  diclofenac Sodium (VOLTAREN) 1 % GEL Apply 2 g topically daily as needed (pain). 06/08/19  Yes [provider]  escitalopram (LEXAPRO) 20 MG tablet Take 20 mg by mouth daily.   Yes [provider]  oxyCODONE (ROXICODONE)  5 MG immediate release tablet Take 1 tablet (5 mg total) by mouth every 6 (six) hours as needed for severe pain. 04/30/21  Yes Drenda Freeze, MD  QUEtiapine (SEROQUEL) 200 MG tablet Take 200 mg by mouth at bedtime. 08/05/18  Yes [provider]  tamsulosin (FLOMAX) 0.4 MG CAPS capsule Take 0.4 mg by mouth daily. for benign prostatic hyperplasia 09/12/20 09/13/21 Yes [provider]  traMADol (ULTRAM-ER) 200 MG 24 hr tablet Take 200 mg by mouth daily as needed for pain. 05/07/21  Yes [provider]  traZODone (DESYREL) 50 MG tablet Take 50 mg by mouth daily as needed for sleep. Patient stated that he is to take 8 tablets at bedtime   Yes [provider]  albuterol (VENTOLIN HFA) 108 (90 Base) MCG/ACT inhaler Inhale 2 puffs into the lungs every 6 (six) hours as needed for wheezing or shortness of breath.    [provider]  Albuterol Sulfate (PROAIR RESPICLICK) 182 (90 Base) MCG/ACT AEPB 1 puff as needed    [provider]  amLODipine (NORVASC) 10 MG tablet Take 5 mg by mouth daily. 12/05/20 12/06/21  [provider]  ascorbic acid (VITAMIN C) 250 MG tablet Vitamin C 250 mg tablet    [provider]  ASMANEX, 60 METERED DOSES, 220 MCG/INH inhaler Inhale 1 puff into the lungs daily as needed (for wheezing or shortness of breath).  01/22/19   [provider]  brimonidine (ALPHAGAN) 0.2 % ophthalmic solution Place 1 drop into both eyes at bedtime.    [provider]  capsaicin (ZOSTRIX) 0.025 % cream Apply 1 application topically daily. 08/10/18   [provider]  Capsicum Oleoresin LIQD See admin instructions.    [provider]  carvedilol (COREG) 12.5 MG tablet Take 12.5 mg by mouth 2 (two) times daily with a meal.    [provider]  ciclopirox (PENLAC) 8 % solution Apply one coat to toenail qd.  Remove weekly with polish remover. 04/20/21   Marzetta Board, DPM  COMBIVENT RESPIMAT  20-100 MCG/ACT AERS respimat Inhale 1 puff into the lungs every 6 (six) hours as needed for wheezing or shortness of breath. 06/27/18  [provider]  Cyanocobalamin (VITAMIN B 12 PO) Take 1 tablet by mouth daily.    [provider]  cyclobenzaprine (FLEXERIL) 5 MG tablet TAKE ONE TABLET BY MOUTH TWO TIMES A DAY AS NEEDED FOR MUSCLE SPASMS. MAY MAKE YOU DROWSY. 09/19/20 09/20/21  [provider]  docusate sodium (COLACE) 100 MG capsule Take 100 mg by mouth 2 (two) times daily as needed for mild constipation.    [provider]  ferrous sulfate 325 (65 FE) MG tablet FeroSul 325 mg (65 mg iron) tablet    [provider]  furosemide (LASIX) 20 MG tablet Take 20 mg by mouth daily.    [provider]  gabapentin (NEURONTIN) 300 MG capsule Take 300 mg by mouth every 8 (eight) hours as needed (for nerve pain). 12/16/20   [provider]  HYDROcodone-acetaminophen (NORCO/VICODIN) 5-325 MG per tablet Take 1 tablet by mouth every 6 (six) hours as needed for moderate pain.    [provider]  HYDROPHILIC EX APPLY MODERATE AMOUNT TOPICALLY EVERY DAY 03/08/21   [provider]  ketoconazole (NIZORAL) 2 % cream Apply 1 application topically daily. Apply to both feet and between toes once daily for 6 weeks Patient taking differently: Apply 1 application topically daily as needed for irritation. 10/04/18   Marzetta Board, DPM  latanoprost (XALATAN) 0.005 % ophthalmic solution Apply to eye. 09/03/20   [provider]  lidocaine-prilocaine (EMLA) cream Apply 1 application topically daily as needed (pain).    [provider]  meloxicam (MOBIC) 15 MG tablet Take 15 mg by mouth daily. Take with food, for pain and inflammation 02/05/21   [provider]  Menthol-Methyl Salicylate (THERA-GESIC) 1-15 % CREA Apply 1 application topically in the morning and at bedtime.    [provider]  metFORMIN (GLUCOPHAGE)  1000 MG tablet Take 1 tablet (1,000 mg total) by mouth 2 (two) times daily with a meal. 06/27/13   Yoo, Doe-Hyun R, DO  metFORMIN (GLUCOPHAGE-XR) 500 MG 24 hr tablet TAKE FOUR TABLETS BY MOUTH BEFORE DINNER FOR DIABETES * NEW METFORMIN - MAY TAKE WITH LARGEST MEAL* 09/30/20 10/01/21  [provider]  metFORMIN (GLUCOPHAGE-XR) 500 MG 24 hr tablet TAKE FOUR TABLETS BY MOUTH BEFORE DINNER FOR DIABETES * NEW METFORMIN - MAY TAKE WITH LARGEST MEAL* 09/30/20   [provider]  Mometasone Furoate (ASMANEX HFA) 200 MCG/ACT AERO 2 puffs    [provider]  naproxen (NAPROSYN) 500 MG tablet Take 1 tablet (500 mg total) by mouth 2 (two) times daily. 11/06/20   Kinnie Feil, PA-C  NARCAN 4 MG/0.1ML LIQD nasal spray kit Place 1 spray into the nose once as needed (for opioid overdose). 09/19/20   [provider]  omeprazole (PRILOSEC) 20 MG capsule Take 20 mg by mouth daily.    [provider]  Pramoxine-HC (PRAMOSONE) 1-2.5 % OINT Pramosone 2.5 %- 1 % topical ointment    [provider]  predniSONE (DELTASONE) 20 MG tablet Take 60 mg daily x 2 days then 40 mg daily x 2 days then 20 mg daily x 2 days 04/30/21   Drenda Freeze, MD  psyllium (METAMUCIL) 58.6 % powder Take 1 packet by mouth daily as needed (constipation).    [provider]  sildenafil (VIAGRA) 100 MG tablet Take 100 mg by mouth See admin instructions. Take 100mg  by mouth as needed one hour before sexual activity for erectile dysfunction. Do not take within 6 hours of terazosin, prazosin, alfuzosin or  doxazosin. Do not take more than 1 dose within 24 hours. Do not take with nitrates. 09/30/20   [provider]  tiZANidine (ZANAFLEX) 4 MG tablet TAKE ONE TABLET BY MOUTH THREE TIMES DAILY Patient not taking: No sig reported 05/03/16   [place], Betty G, MD    Allergies    Other  Review of Systems   Review of Systems  Constitutional:  Negative for activity change, appetite change,  chills, fatigue and fever.  HENT:  Negative for ear pain and sore throat.   Eyes:  Negative for pain and visual disturbance.  Respiratory:  Negative for cough and shortness of breath.   Cardiovascular:  Negative for chest pain and palpitations.  Gastrointestinal:  Negative for abdominal pain and vomiting.  Genitourinary:  Negative for dysuria, flank pain and hematuria.  Musculoskeletal:  Positive for arthralgias (Chronic) and back pain (Chronic). Negative for joint swelling, myalgias and neck pain.  Skin:  Negative for color change and rash.  Neurological:  Negative for dizziness, seizures, syncope, weakness, light-headedness, numbness and headaches.  Hematological:  Does not bruise/bleed easily.  Psychiatric/Behavioral:  Negative for confusion and decreased concentration.   All other systems reviewed and are negative.  Physical Exam Updated Vital Signs BP 140/86   Pulse 75   Temp 98.6 F (37 C) (Oral)   Resp 16   SpO2 100%   Physical Exam Vitals and nursing note reviewed.  Constitutional:      General: He is not in acute distress.    Appearance: Normal appearance. He is well-developed. He is not ill-appearing, toxic-appearing or diaphoretic.  HENT:     Head: Normocephalic and atraumatic.     Right Ear: External ear normal.     Left Ear: External ear normal.  Eyes:     Extraocular Movements: Extraocular movements intact.     Conjunctiva/sclera: Conjunctivae normal.  Cardiovascular:     Rate and Rhythm: Normal rate and regular rhythm.     Heart sounds: No murmur heard. Pulmonary:     Effort: Pulmonary effort is normal. No respiratory distress.     Breath sounds: Normal breath sounds.  Abdominal:     Palpations: Abdomen is soft.     Tenderness: There is no abdominal tenderness.  Musculoskeletal:        General: Tenderness present. No swelling, deformity or signs of injury.     Cervical back: Normal range of motion and neck supple. No rigidity.  Skin:    General: Skin is  warm and dry.     Coloration: Skin is not jaundiced or pale.  Neurological:     General: No focal deficit present.     Mental Status: He is alert and oriented to person, place, and time.     Cranial Nerves: No cranial nerve deficit.     Sensory: No sensory deficit.     Motor: No weakness.  Psychiatric:        Mood and Affect: Mood normal.        Behavior: Behavior normal.        Thought Content: Thought content normal.        Judgment: Judgment normal.    ED Results / Procedures / Treatments   Labs (all labs ordered are listed, but only abnormal results are displayed) Labs Reviewed - No data to display  EKG None  Radiology No results found.  Procedures Procedures   Medications Ordered in ED Medications  dexamethasone (DECADRON) injection 4 mg (4 mg Intramuscular Given 05/27/21 0910)  ED Course  I have reviewed the triage vital signs and the nursing notes.  Pertinent labs & imaging results that were available during my care of the patient were reviewed by me and considered in my medical decision making (see chart for details).    MDM Rules/Calculators/A&P                          Patient presents for chronic lower back pain and chronic bilateral knee pain.  He denies any recent injuries.  He denies any changes to the character of his pain.  He denies any red flag symptoms of his lower back pain.  He has been utilizing multimodal pain control at home: Tylenol, Naprosyn, Flexeril, and recently started Suboxone. Currently, he is prescribed medications, including narcotics, through the [place].  He is currently in search of a new primary care doctor to manage his chronic pain. He reports continued pain despite these medications.  3 weeks ago, he did come to the ED and, at that time, he got a intramuscular steroid shot.  This did reportedly give him relief of his pain for 3 days.  Since that time, he has not followed up with orthopedics.  He presents to the ED today requesting  steroid shot.  He is well-appearing on exam.  There are no areas of swelling or erythema concerning for infection or acute injury to his knees.  He has mild paraspinal tenderness.  He has no numbness or weakness.  Patient was given intramuscular Decadron.  He was advised to continue to manage his chronic pain through his outpatient providers and to follow-up with orthopedic/sports medicine for additional care.  He was discharged in stable condition.  Final Clinical Impression(s) / ED Diagnoses Final diagnoses:  Chronic bilateral low back pain without sciatica  Chronic pain of both knees    Rx / DC Orders ED Discharge Orders     None        Godfrey Pick, MD 05/27/21 1744

## 2021-05-27 NOTE — ED Triage Notes (Addendum)
Pt reports with chronic left knee and back pain. Pt states that the pain medication that he is taking is not working.

## 2021-06-05 ENCOUNTER — Ambulatory Visit: Payer: No Typology Code available for payment source | Admitting: Podiatry

## 2021-07-10 ENCOUNTER — Ambulatory Visit (INDEPENDENT_AMBULATORY_CARE_PROVIDER_SITE_OTHER): Payer: No Typology Code available for payment source | Admitting: Podiatry

## 2021-07-10 ENCOUNTER — Encounter: Payer: Self-pay | Admitting: Podiatry

## 2021-07-10 ENCOUNTER — Other Ambulatory Visit: Payer: Self-pay

## 2021-07-10 DIAGNOSIS — Q828 Other specified congenital malformations of skin: Secondary | ICD-10-CM

## 2021-07-10 DIAGNOSIS — M79674 Pain in right toe(s): Secondary | ICD-10-CM

## 2021-07-10 DIAGNOSIS — E1142 Type 2 diabetes mellitus with diabetic polyneuropathy: Secondary | ICD-10-CM

## 2021-07-10 DIAGNOSIS — B351 Tinea unguium: Secondary | ICD-10-CM

## 2021-07-10 DIAGNOSIS — M79675 Pain in left toe(s): Secondary | ICD-10-CM

## 2021-07-16 NOTE — Progress Notes (Signed)
Subjective: Christopher Riley is a 70 y.o. male patient seen today for at risk foot care follow up. He c/o painful plantar calluses b/l and painful thick toenails that are difficult to trim. Pain interferes with ambulation. Aggravating factors include wearing enclosed shoe gear. Pain is relieved with periodic professional debridement.  New problems reported today: None.  [facility] is [facility] visit was: 06/22/2021.  Allergies  Allergen Reactions   Other Other (See Comments)    Non steroidal anti inflammatory analgesic - GI bleed    Objective: Physical Exam  General: Patient is a pleasant 70 y.o. African American male morbidly obese in NAD. AAO x 3.   Neurovascular Examination: CFT <3 seconds b/l LE. Palpable DP pulse(s) b/l LE. Palpable PT pulse(s) b/l LE. Pedal hair absent. No pain with calf compression b/l. Lower extremity skin temperature gradient within normal limits. Nonpitting edema noted BLE. Evidence of chronic venous insufficiency b/l LE. No ischemia or gangrene noted b/l LE. No cyanosis or clubbing noted b/l LE.  Protective sensation diminished with 10g monofilament b/l.  Dermatological:  No open wounds b/l LE. No interdigital macerations noted b/l LE. Toenails 1-5 b/l elongated, discolored, dystrophic, thickened, crumbly with subungual debris and tenderness to dorsal palpation. Porokeratotic lesion(s) submet head 5 b/l. No erythema, no edema, no drainage, no fluctuance.  Musculoskeletal:  Muscle strength 5/5 to all lower extremity muscle groups bilaterally. Hallux hammertoe noted b/l LE. Utilizes rollator for ambulation assistance.  Assessment: 1. Pain due to onychomycosis of toenails of both feet   2. Porokeratosis   3. Diabetic peripheral neuropathy associated with type 2 diabetes mellitus ([place])     Plan: Patient was evaluated and treated and all questions answered. Consent given for treatment as described below: -Diabetic foot examination performed  today. -Continue foot and shoe inspections daily. Monitor blood glucose per PCP/Endocrinologist's recommendations. -Mycotic toenails 1-5 bilaterally were debrided in length and girth with sterile nail nippers and dremel without incident. -Painful porokeratotic lesion(s) submet head 5 b/l pared and enucleated with sterile scalpel blade without incident. Total number of lesions debrided=2. -Patient/POA to call should there be question/concern in the interim.  No follow-ups on file.  Marzetta Board, DPM

## 2021-08-11 ENCOUNTER — Ambulatory Visit (INDEPENDENT_AMBULATORY_CARE_PROVIDER_SITE_OTHER): Payer: Medicare PPO | Admitting: Podiatry

## 2021-08-11 ENCOUNTER — Encounter: Payer: Self-pay | Admitting: Podiatry

## 2021-08-11 ENCOUNTER — Other Ambulatory Visit: Payer: Self-pay

## 2021-08-11 DIAGNOSIS — M79674 Pain in right toe(s): Secondary | ICD-10-CM | POA: Diagnosis not present

## 2021-08-11 DIAGNOSIS — Q828 Other specified congenital malformations of skin: Secondary | ICD-10-CM | POA: Diagnosis not present

## 2021-08-11 DIAGNOSIS — M79675 Pain in left toe(s): Secondary | ICD-10-CM | POA: Diagnosis not present

## 2021-08-11 DIAGNOSIS — B351 Tinea unguium: Secondary | ICD-10-CM | POA: Diagnosis not present

## 2021-08-11 DIAGNOSIS — L84 Corns and callosities: Secondary | ICD-10-CM | POA: Diagnosis not present

## 2021-08-11 DIAGNOSIS — E1142 Type 2 diabetes mellitus with diabetic polyneuropathy: Secondary | ICD-10-CM | POA: Diagnosis not present

## 2021-08-17 NOTE — Progress Notes (Signed)
°  Subjective:  Patient ID: Christopher Riley, male    DOB: Oct 22, 1950,  MRN: [id number]  Christopher Riley presents to clinic today for at risk foot care with history of diabetic neuropathy, callus(es) right great toe and painful thick toenails that are difficult to trim. Painful toenails interfere with ambulation. Aggravating factors include wearing enclosed shoe gear. Pain is relieved with periodic professional debridement. Painful calluses are aggravated when weightbearing with and without shoegear. Pain is relieved with periodic professional debridement. He is also seen for painful porokeratotic lesion left foot.  Pain prevents comfortable ambulation. Aggravating factor is weightbearing with or without shoegear.  He states he did get his diabetic [facility] from the [facility] and they fit well.   PCP is [facility] , and last visit was 06/22/2021.  Allergies  Allergen Reactions   Other Other (See Comments)    Non steroidal anti inflammatory analgesic - GI bleed    Review of Systems: Negative except as noted in the HPI. Objective:   Constitutional Christopher Riley is a pleasant 71 y.o. African American male, morbidly obese in NAD. AAO x 3.   Vascular Capillary refill time to digits immediate b/l. Palpable pedal pulses b/l LE. Pedal hair absent. No pain with calf compression b/l. Nonpitting edema noted BLE. Varicosities present b/l. Evidence of chronic venous insufficiency b/l LE.  Neurologic Normal speech. Oriented to person, place, and time. Protective sensation diminished with 10g monofilament b/l.  Dermatologic No open wounds b/l LE. No interdigital macerations noted b/l LE. Toenails 1-5 b/l elongated, discolored, dystrophic, thickened, crumbly with subungual debris and tenderness to dorsal palpation. Hyperkeratotic lesion(s) R hallux.  No erythema, no edema, no drainage, no fluctuance. Porokeratotic lesion(s) submet head 5 left foot. No erythema, no edema, no drainage, no fluctuance.   Orthopedic: Muscle strength 5/5 to all lower extremity muscle groups bilaterally. Hallux hammertoe noted b/l LE.   Radiographs: None Assessment:   1. Pain due to onychomycosis of toenails of both feet   2. Porokeratosis   3. Callus   4. Diabetic polyneuropathy associated with type 2 diabetes mellitus ([place])    Plan:  Patient was evaluated and treated and all questions answered. Consent given for treatment as described below: -Mycotic toenails 1-5 bilaterally were debrided in length and girth with sterile nail nippers and dremel without incident. -Callus(es) R hallux pared utilizing sterile scalpel blade without complication or incident. Total number debrided =1. -Painful porokeratotic lesion(s) submet head 5 left foot pared and enucleated with sterile scalpel blade without incident. Total number of lesions debrided=1. -Patient/POA to call should there be question/concern in the interim.  Return in about 6 weeks (around 09/22/2021).  Marzetta Board, DPM

## 2021-08-24 ENCOUNTER — Ambulatory Visit: Payer: No Typology Code available for payment source | Admitting: Podiatry

## 2021-08-28 ENCOUNTER — Ambulatory Visit: Payer: No Typology Code available for payment source | Admitting: Podiatry

## 2021-09-23 ENCOUNTER — Ambulatory Visit (INDEPENDENT_AMBULATORY_CARE_PROVIDER_SITE_OTHER): Payer: Medicare PPO | Admitting: Podiatry

## 2021-09-23 ENCOUNTER — Encounter: Payer: Self-pay | Admitting: Podiatry

## 2021-09-23 ENCOUNTER — Other Ambulatory Visit: Payer: Self-pay

## 2021-09-23 DIAGNOSIS — Q828 Other specified congenital malformations of skin: Secondary | ICD-10-CM | POA: Diagnosis not present

## 2021-09-23 DIAGNOSIS — E1142 Type 2 diabetes mellitus with diabetic polyneuropathy: Secondary | ICD-10-CM

## 2021-09-29 NOTE — Progress Notes (Signed)
Subjective: ?Christopher Riley is a 71 y.o. male patient seen today for follow up of  at risk foot care with history of diabetic neuropathy and painful porokeratotic lesions .left foot  Pain prevent(s) comfortable ambulation. Aggravating factor is weightbearing with and without shoegear. ? ?Patient did not check blood glucose on today. ? ?New problem(s)/concern(s) today: None   ? ?[facility]P is [facility] [place] visit was: 08/11/2021. ? ?Allergies  ?Allergen Reactions  ? Nsaids   ? Other Other (See Comments)  ?  Non steroidal anti inflammatory analgesic - GI bleed  ? ? ?Objective: ?Physical Exam ? ?General: Patient is a pleasant 71 y.o. African American male morbidly obese in NAD. AAO x 3.  ? ?Neurovascular Examination: ?CFT <3 seconds b/l LE. Palpable DP pulse(s) b/l LE. Palpable PT pulse(s) b/l LE. Pedal hair absent. No pain with calf compression b/l. Lower extremity skin temperature gradient within normal limits. Trace edema noted BLE. Evidence of chronic venous insufficiency b/l LE. No cyanosis or clubbing noted b/l LE. ? ?Protective sensation diminished with 10g monofilament b/l. ? ?Dermatological:  ?Pedal skin is warm and supple b/l LE. No open wounds b/l LE. No interdigital macerations noted b/l LE. Toenails recently debrided. Porokeratotic lesion(s) submet head 5 left foot. No erythema, no edema, no drainage, no fluctuance. ? ?Musculoskeletal:  ?Muscle strength 5/5 to all lower extremity muscle groups bilaterally. Hallux hammertoe noted b/l LE. Wearing diabetic shoes today. Utilizes rollator for ambulation assistance. ? ?Assessment: ?1. Porokeratosis   ?2. Diabetic polyneuropathy associated with type 2 diabetes mellitus ([place])   ? ?Plan: ?Patient was evaluated and treated and all questions answered. ?Consent given for treatment as described below: ?-Painful porokeratotic lesion(s) submet head 5 left foot pared and enucleated with sterile scalpel blade without incident. Total number of lesions  debrided=1. ?-Patient/POA to call should there be question/concern in the interim. ? ?Return in about 6 weeks (around 11/04/2021). ? ?Marzetta Board, DPM ?

## 2021-11-04 ENCOUNTER — Ambulatory Visit: Payer: No Typology Code available for payment source | Admitting: Podiatry

## 2021-11-09 IMAGING — NM NM BONE 3 PHASE
2 series · 12 of 12 positions shown · non-contrast
Comparison: Radiographs 11/03/2020.

CLINICAL DATA: Right knee pain for several years. History of right
ago.

EXAM:
NUCLEAR MEDICINE 3-PHASE BONE SCAN
TECHNIQUE: Radionuclide angiographic images, immediate static blood pool
images, and 3-hour delayed static images were obtained of the knees
after intravenous injection of radiopharmaceutical.
RADIOPHARMACEUTICALS:  21.3 mCi 1c-YYm MDP IV

[Series 1: flow · 4.14mm/px · 6 of 40 frames shown (1 of 2)]
[frame 4/40  full-range]
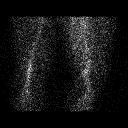
[frame 10/40  full-range]
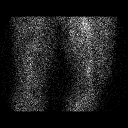
[frame 17/40  full-range]
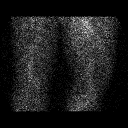
[frame 24/40  full-range]
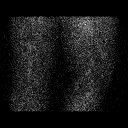
[frame 30/40  full-range]
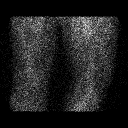
[frame 37/40  full-range]
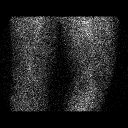

[Series 1: flow · 4.14mm/px · 6 of 40 frames shown (2 of 2)]
[frame 4/40  full-range]
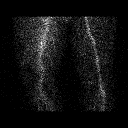
[frame 10/40  full-range]
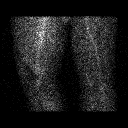
[frame 17/40  full-range]
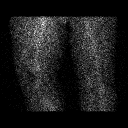
[frame 24/40  full-range]
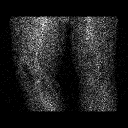
[frame 30/40  full-range]
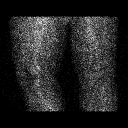
[frame 37/40  full-range]
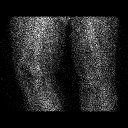

[12 of 12 positions shown; findings below may reference images not displayed]

FINDINGS: Vascular phase: Normal symmetric perfusion to both knees.

Blood pool phase: The blood pool activity is normal bilaterally.

Delayed phase: Mild nonspecific delayed phase activity medially in
the right knee, not conforming to the arthroplasty. Mild medial
compartment degenerative activity in the left knee.
IMPRESSION: 1. No acute findings or evidence of loosening of the right total
knee arthroplasty.
2. Mild nonspecific delayed phase activity medially in the right
knee.
3. Mild degenerative activity medially in the left knee.

## 2021-11-10 ENCOUNTER — Ambulatory Visit: Payer: Medicare PPO | Admitting: Podiatry

## 2021-11-10 ENCOUNTER — Encounter: Payer: Self-pay | Admitting: Podiatry

## 2021-11-10 DIAGNOSIS — E1142 Type 2 diabetes mellitus with diabetic polyneuropathy: Secondary | ICD-10-CM

## 2021-11-10 DIAGNOSIS — Q828 Other specified congenital malformations of skin: Secondary | ICD-10-CM

## 2021-11-21 NOTE — Progress Notes (Signed)
?  Subjective:  ?Patient ID: Christopher Riley, male    DOB: 25-Aug-1950,  MRN: [id number] ? ?DEJA KAIGLER presents to clinic today for at risk foot care with history of diabetic neuropathy and painful porokeratotic lesions both feet. Pain prevent(s) comfortable ambulation. Aggravating factor is weightbearing with and without shoegear. ? ?Patient is normally seen every 6 weeks under the [facility] and would have unlimited visits. His referral has expired and our referral department states it's too soon to be eligible under his Medicare. He was informed he will be responsible for today's visit. He relates understanding. ? ?New problem(s): None.  ? ?PCP is Katherina Mires, MD , and last visit was July 13, 2021. ? ?Allergies  ?Allergen Reactions  ? Nsaids   ? Other Other (See Comments)  ?  Non steroidal anti inflammatory analgesic - GI bleed  ? ? ?Review of Systems: Negative except as noted in the HPI. ? ?Objective: General: Patient is a pleasant 71 y.o. African American male morbidly obese in NAD. AAO x 3.  ? ?Neurovascular Examination: ?CFT <3 seconds b/l LE. Palpable DP pulse(s) b/l LE. Palpable PT pulse(s) b/l LE. Pedal hair absent. No pain with calf compression b/l. Lower extremity skin temperature gradient within normal limits. Trace edema noted BLE. Evidence of chronic venous insufficiency b/l LE. No cyanosis or clubbing noted b/l LE. ? ?Protective sensation diminished with 10g monofilament b/l. ? ?Dermatological:  ?No open wounds b/l LE. No interdigital macerations noted b/l LE. Toenails recently debrided. Porokeratotic lesion(s) submet head 5 b/l. No erythema, no edema, no drainage, no fluctuance. Evidence of chronic venous insufficiency b/l lower extremities. Hyperpigmentation consistent with findings of chronic venous insufficiency is present b/l lower extremities.  ? ?Musculoskeletal:  ?Muscle strength 5/5 to all lower extremity muscle groups bilaterally. Hallux hammertoe noted b/l LE.  Wearing diabetic shoes today. Utilizes rollator for ambulation assistance. ? ?Assessment/Plan: ?1. Porokeratosis   ?2. Diabetic polyneuropathy associated with type 2 diabetes mellitus ([place])   ?-Patient was evaluated and treated. All patient's and/or POA's questions/concerns answered on today's visit. ?-Medicare ABN signed. Patient consents for services of porokeratoses b/l today due to frequency of visits. Copy has been placed in patient's chart. ?-Painful porokeratotic lesion(s) submet head 5 b/l pared and enucleated with sterile scalpel blade without incident. Total number of lesions debrided=2. ?-Patient/POA to call should there be question/concern in the interim.  ?-Patient was advised to contact his patient representative at the [place] in order to obtain his new [facility] referral for our office. He states he will call the [place]. ?-Keep next appointment. ? ?Marzetta Board, DPM  ?

## 2021-11-23 ENCOUNTER — Telehealth: Payer: Self-pay | Admitting: Podiatry

## 2021-11-23 NOTE — Telephone Encounter (Signed)
FYI   Pt left message that he needed to cancel the appt for tomorrow. He seen Dr Elisha Ponder previously and he is having a problem with her not finishing the job for his calluses. ?It appears the appt was already cxled for [id number]... ?

## 2021-11-24 ENCOUNTER — Ambulatory Visit: Payer: Medicare PPO | Admitting: Podiatry

## 2021-11-24 ENCOUNTER — Ambulatory Visit: Payer: Medicare Other | Admitting: Podiatry

## 2021-11-26 ENCOUNTER — Telehealth: Payer: Self-pay | Admitting: Podiatry

## 2021-11-26 NOTE — Telephone Encounter (Signed)
Called patient and let him know to bring his shoes and insoles to be evaluated by Aaron Edelman.

## 2021-11-26 NOTE — Telephone Encounter (Signed)
Patient wants you to call him, he states you did not cut the callous close enough on his left foot.   He does not want an extra charge for him having to come back in to have it completed.

## 2021-12-08 ENCOUNTER — Ambulatory Visit: Payer: Medicare PPO | Admitting: Podiatry

## 2021-12-10 ENCOUNTER — Ambulatory Visit (INDEPENDENT_AMBULATORY_CARE_PROVIDER_SITE_OTHER): Payer: Medicare PPO | Admitting: Podiatry

## 2021-12-10 ENCOUNTER — Ambulatory Visit (INDEPENDENT_AMBULATORY_CARE_PROVIDER_SITE_OTHER): Payer: Medicare PPO

## 2021-12-10 ENCOUNTER — Ambulatory Visit: Payer: Medicare PPO

## 2021-12-10 DIAGNOSIS — M79675 Pain in left toe(s): Secondary | ICD-10-CM | POA: Diagnosis not present

## 2021-12-10 DIAGNOSIS — M19072 Primary osteoarthritis, left ankle and foot: Secondary | ICD-10-CM | POA: Diagnosis not present

## 2021-12-10 DIAGNOSIS — M79674 Pain in right toe(s): Secondary | ICD-10-CM

## 2021-12-10 DIAGNOSIS — E1142 Type 2 diabetes mellitus with diabetic polyneuropathy: Secondary | ICD-10-CM

## 2021-12-10 DIAGNOSIS — M2041 Other hammer toe(s) (acquired), right foot: Secondary | ICD-10-CM

## 2021-12-10 DIAGNOSIS — L84 Corns and callosities: Secondary | ICD-10-CM | POA: Diagnosis not present

## 2021-12-10 DIAGNOSIS — B351 Tinea unguium: Secondary | ICD-10-CM | POA: Diagnosis not present

## 2021-12-10 NOTE — Progress Notes (Signed)
Patient seen for evaluation for diabetic shoes and insoles. Patient insisted he gets two pair of shoes through the [place], and I confirmed that yes, the [facility] has historically ordered multiple pairs of shoes for veterans at a time, however I do not have a VA order in the system and would either need to receive one or have Dr. Sherryle Lis generate it and send it to the [place], that in our system patient is listed under [facility], who will provide one pair of shoes and three pair of inserts. Patient said he would like to check with his usual supplier of diabetic shoes, the [employer] on [address]. Explained to patient that [employer] no longer provides diabetic shoes. Patient insisted they do and wants to check with them first to see if they have a VA order. Patient will call us back if [employer] does not meet his needs. All questions answered and concerns addressed.

## 2021-12-13 NOTE — Progress Notes (Signed)
  Subjective:  Patient ID: Christopher Riley, male    DOB: 1950/08/12,  MRN: [id number]  Chief Complaint  Patient presents with   Callouses     painful callus lesions/at risk foot care    71 y.o. male presents with the above complaint. History confirmed with patient.  His painful callus lesions are back and bothering him again on both feet.  His nails are thick and elongated.  He has been having swelling and pain in his left ankle on and off  Objective:  Physical Exam: warm, good capillary refill, no trophic changes or ulcerative lesions, and normal DP and PT pulses.  Bilateral submetatarsal 5 callus, he has edema and pain and limited range of motion of the left ankle Left Foot: dystrophic yellowed discolored nail plates with subungual debris Right Foot: dystrophic yellowed discolored nail plates with subungual debris  No images are attached to the encounter.  Radiographs: Multiple views x-ray of left ankle: Early degenerative changes with anterior ankle spurring Assessment:   1. Arthritis of left ankle   2. Callus   3. Pain due to onychomycosis of toenails of both feet   4. Diabetic polyneuropathy associated with type 2 diabetes mellitus ([place])   5. Hammer toes of both feet      Plan:  Patient was evaluated and treated and all questions answered.  Discussed treatment of ankle arthritis symptomatically as well as supportive shoe gear.  Recommended Voltaren gel to alleviate pain and when it is inflamed.  Elevate the leg when he is off of it.  Discussed the possibility of injection if this gets worse he will return to see me for this if he needs it.  Patient educated on diabetes. Discussed proper diabetic foot care and discussed risks and complications of disease. Educated patient in depth on reasons to return to the office immediately should he/she discover anything concerning or new on the feet. All questions answered. Discussed proper shoes as well.   Discussed the etiology and  treatment options for the condition in detail with the patient. Educated patient on the topical and oral treatment options for mycotic nails. Recommended debridement of the nails today. Sharp and mechanical debridement performed of all painful and mycotic nails today. Nails debrided in length and thickness using a nail nipper to level of comfort. Discussed treatment options including appropriate shoe gear. Follow up as needed for painful nails.   All symptomatic hyperkeratoses were safely debrided with a sterile #15 blade to patient's level of comfort without incident. We discussed preventative and palliative care of these lesions including supportive and accommodative shoegear, padding, prefabricated and custom molded accommodative orthoses, use of a pumice stone and lotions/creams daily.   Return if symptoms worsen or fail to improve.

## 2021-12-14 ENCOUNTER — Ambulatory Visit: Payer: Medicare PPO | Admitting: Podiatry

## 2021-12-14 ENCOUNTER — Other Ambulatory Visit: Payer: Medicare PPO

## 2022-02-12 ENCOUNTER — Ambulatory Visit (INDEPENDENT_AMBULATORY_CARE_PROVIDER_SITE_OTHER): Payer: Medicare PPO | Admitting: Podiatry

## 2022-02-12 ENCOUNTER — Encounter: Payer: Self-pay | Admitting: Podiatry

## 2022-02-12 DIAGNOSIS — B351 Tinea unguium: Secondary | ICD-10-CM

## 2022-02-12 DIAGNOSIS — B353 Tinea pedis: Secondary | ICD-10-CM

## 2022-02-12 DIAGNOSIS — E1142 Type 2 diabetes mellitus with diabetic polyneuropathy: Secondary | ICD-10-CM | POA: Diagnosis not present

## 2022-02-12 DIAGNOSIS — M79674 Pain in right toe(s): Secondary | ICD-10-CM

## 2022-02-12 DIAGNOSIS — E119 Type 2 diabetes mellitus without complications: Secondary | ICD-10-CM | POA: Diagnosis not present

## 2022-02-12 DIAGNOSIS — M79675 Pain in left toe(s): Secondary | ICD-10-CM

## 2022-02-12 DIAGNOSIS — Q828 Other specified congenital malformations of skin: Secondary | ICD-10-CM

## 2022-02-12 DIAGNOSIS — M2141 Flat foot [pes planus] (acquired), right foot: Secondary | ICD-10-CM

## 2022-02-12 DIAGNOSIS — M2142 Flat foot [pes planus] (acquired), left foot: Secondary | ICD-10-CM | POA: Diagnosis not present

## 2022-02-12 MED ORDER — KETOCONAZOLE 2 % EX CREA: TOPICAL_CREAM | CUTANEOUS | 1 refills | Status: DC

## 2022-02-12 NOTE — Patient Instructions (Signed)
To prevent reinfection, spray shoes with lysol every evening.  Clean tub or shower with bleach based cleanser.   Athlete's Foot Athlete's foot (tinea pedis) is a fungal infection of the skin on your feet. It often occurs on the skin that is between or underneath the toes. It can also occur on the soles of your feet. The infection can spread from person to person (is contagious). It can also spread when a person's bare feet come in contact with the fungus on shower floors or on items such as shoes. What are the causes? This condition is caused by a fungus that grows in warm, moist places. You can get athlete's foot by sharing shoes, shower stalls, towels, and wet floors with someone who is infected. Not washing your feet or changing your socks often enough can also lead to athlete's foot. What increases the risk? This condition is more likely to develop in: Men. People who have a weak body defense system (immune system). People who have diabetes. People who use public showers, such as at a gym. People who wear heavy-duty shoes, such as Environmental manager. Seasons with warm, humid weather. What are the signs or symptoms? Symptoms of this condition include: Itchy areas between your toes or on the soles of your feet. White, flaky, or scaly areas between your toes or on the soles of your feet. Very itchy small blisters between your toes or on the soles of your feet. Small cuts in your skin. These cuts can become infected. Thick or discolored toenails. How is this diagnosed? This condition may be diagnosed with a physical exam and a review of your medical history. Your health care provider may also take a skin or toenail sample to examine under a microscope. How is this treated? This condition is treated with antifungal medicines. These may be applied as powders, ointments, or creams. In severe cases, an oral antifungal medicine may be given. Follow these instructions at  home: Medicines Apply or take over-the-counter and prescription medicines only as told by your health care provider. Apply your antifungal medicine as told by your health care provider. Do not stop using the antifungal even if your condition improves. Foot care Do not scratch your feet. Keep your feet dry: Wear cotton or wool socks. Change your socks every day or if they become wet. Wear shoes that allow air to flow, such as sandals or canvas tennis shoes. Wash and dry your feet, including the area between your toes. Also, wash and dry your feet: Every day or as told by your health care provider. After exercising. General instructions Do not let others use towels, shoes, nail clippers, or other personal items that touch your feet. Protect your feet by wearing sandals in wet areas, such as locker rooms and shared showers. Keep all follow-up visits. This is important. If you have diabetes, keep your blood sugar under control. Contact a health care provider if: You have a fever. You have swelling, soreness, warmth, or redness in your foot. Your feet are not getting better with treatment. Your symptoms get worse. You have new symptoms. You have severe pain. Summary Athlete's foot (tinea pedis) is a fungal infection of the skin on your feet. It often occurs on skin that is between or underneath the toes. This condition is caused by a fungus that grows in warm, moist places. Symptoms include white, flaky, or scaly areas between your toes or on the soles of your feet. This condition is treated with antifungal medicines.  Keep your feet clean. Always dry them thoroughly. This information is not intended to replace advice given to you by your health care provider. Make sure you discuss any questions you have with your health care provider. Document Revised: 11/02/2020 Document Reviewed: 11/02/2020 Elsevier Patient Education  [place].

## 2022-02-20 NOTE — Progress Notes (Signed)
  Subjective:  Patient ID: Christopher Riley, male    DOB: 09-20-1950,  MRN: [id number]  JHOVANNY GUINTA presents to clinic today for for annual diabetic foot examination, at risk foot care with history of diabetic neuropathy, and painful porokeratotic lesion(s) left lower extremity and painful mycotic toenails that limit ambulation. Painful toenails interfere with ambulation. Aggravating factors include wearing enclosed shoe gear. Pain is relieved with periodic professional debridement. Painful porokeratotic lesions are aggravated when weightbearing with and without shoegear. Pain is relieved with periodic professional debridement.  Last known HgA1c was unknown.  Patient does not monitor blood glucose daily.  New problem(s): None.   PCP is Katherina Mires, MD , and last visit was  July 13, 2021  Allergies  Allergen Reactions   Nsaids    Other Other (See Comments)    Non steroidal anti inflammatory analgesic - GI bleed    Review of Systems: Negative except as noted in the HPI.  Objective:  General: Patient is a pleasant 71 y.o. African American male morbidly obese in NAD. AAO x 3.   Neurovascular Examination: CFT <3 seconds b/l LE. Palpable DP pulse(s) b/l LE. Palpable PT pulse(s) b/l LE. Pedal hair absent. No pain with calf compression b/l. Lower extremity skin temperature gradient within normal limits. Trace edema noted BLE. Evidence of chronic venous insufficiency b/l LE. No cyanosis or clubbing noted b/l LE.  Protective sensation diminished with 10g monofilament b/l.  Dermatological:  No open wounds b/l LE. No interdigital macerations noted b/l LE. Toenails recently debrided. Porokeratotic lesion(s) submet head 5 b/l. No erythema, no edema, no drainage, no fluctuance. Evidence of chronic venous insufficiency b/l lower extremities. Hyperpigmentation consistent with findings of chronic venous insufficiency is present b/l lower extremities.  Diffuse scaling noted peripherally and  plantarly b/l feet.  No interdigital macerations.  No blisters, no weeping. No signs of secondary bacterial infection noted.   Musculoskeletal:  Muscle strength 5/5 to all lower extremity muscle groups bilaterally. Hallux hammertoe noted b/l LE. Wearing diabetic shoes today. Utilizes rollator for ambulation assistance. Assessment/Plan: 1. Pain due to onychomycosis of toenails of both feet   2. Porokeratosis   3. Tinea pedis of both feet   4. Pes planus of both feet   5. Diabetic polyneuropathy associated with type 2 diabetes mellitus ([place])   6. Encounter for diabetic foot exam ([place])   -Examined patient. -Patient to continue soft, supportive shoe gear daily. -Toenails 1-5 b/l were debrided in length and girth with sterile nail nippers and dremel without iatrogenic bleeding.  -Porokeratotic lesion(s) submet head 5 left foot pared and enucleated with sterile scalpel blade without incident. Total number of lesions debrided=1. -For tinea pedis, Rx sent to pharmacy for Ketoconazole Cream 2% to be applied once daily for six weeks. -Offloaded diabetic insert for submet head 5 lesion left foot. -Patient/POA to call should there be question/concern in the interim.   Return in about 9 weeks (around 04/16/2022).  Marzetta Board, DPM

## 2022-05-21 ENCOUNTER — Ambulatory Visit (INDEPENDENT_AMBULATORY_CARE_PROVIDER_SITE_OTHER): Payer: Medicare PPO | Admitting: Podiatry

## 2022-05-21 ENCOUNTER — Encounter: Payer: Self-pay | Admitting: Podiatry

## 2022-05-21 DIAGNOSIS — B351 Tinea unguium: Secondary | ICD-10-CM | POA: Diagnosis not present

## 2022-05-21 DIAGNOSIS — M79675 Pain in left toe(s): Secondary | ICD-10-CM

## 2022-05-21 DIAGNOSIS — M79674 Pain in right toe(s): Secondary | ICD-10-CM | POA: Diagnosis not present

## 2022-05-21 DIAGNOSIS — E1142 Type 2 diabetes mellitus with diabetic polyneuropathy: Secondary | ICD-10-CM

## 2022-05-21 DIAGNOSIS — Q828 Other specified congenital malformations of skin: Secondary | ICD-10-CM

## 2022-05-25 NOTE — Progress Notes (Signed)
  Subjective:  Patient ID: Christopher Riley, male    DOB: 08/27/1950,  MRN: [id number]  Christopher Riley presents to clinic today for  Chief Complaint  Patient presents with   Nail Problem    Thick painful toenails, 3 month follow up   . New problem(s): None.   He has received a new pair of diabetic shoes from the [facility].  PCP is [place], Jannifer Rodney, MD.  Allergies  Allergen Reactions   Nsaids    Other Other (See Comments)    Non steroidal anti inflammatory analgesic - GI bleed   Review of Systems: Negative except as noted in the HPI.  Objective: No changes noted in today's physical examination. IZAK ANDING is a pleasant 71 y.o. male morbidly obese in NAD. AAO x 3.  Neurovascular Examination: CFT <3 seconds b/l LE. Palpable DP pulse(s) b/l LE. Palpable PT pulse(s) b/l LE. Pedal hair absent. No pain with calf compression b/l. Lower extremity skin temperature gradient within normal limits. Trace edema noted BLE. Evidence of chronic venous insufficiency b/l LE. No cyanosis or clubbing noted b/l LE.  Protective sensation diminished with 10g monofilament b/l.  Dermatological:  No open wounds b/l LE. No interdigital macerations noted b/l LE. Toenails 1-5 b/l elongated, discolored, dystrophic, thickened, crumbly with subungual debris and tenderness to dorsal palpation.  Porokeratotic lesion(s) submet head 5 b/l. No erythema, no edema, no drainage, no fluctuance.   Evidence of chronic venous insufficiency b/l lower extremities. Hyperpigmentation consistent with findings of chronic venous insufficiency is present b/l lower extremities.    Musculoskeletal:  Muscle strength 5/5 to all lower extremity muscle groups bilaterally. Hallux hammertoe noted b/l LE. Wearing new diabetic shoes today. Utilizes rollator for ambulation assistance.  Assessment/Plan: 1. Pain due to onychomycosis of toenails of both feet   2. Porokeratosis   3. Diabetic peripheral neuropathy  associated with type 2 diabetes mellitus ([place])     No orders of the defined types were placed in this encounter.   -Patient was evaluated and treated. All patient's and/or POA's questions/concerns answered on today's visit. -Continue diabetic shoes daily. -Mycotic toenails 1-5 bilaterally were debrided in length and girth with sterile nail nippers and dremel without incident. -Porokeratotic lesion(s) submet head 5 b/l pared and enucleated with sterile currette without incident. Total number of lesions debrided=2. -Patient/POA to call should there be question/concern in the interim.   Return in about 3 months (around 08/21/2022).  Marzetta Board, DPM

## 2022-06-02 ENCOUNTER — Telehealth: Payer: Self-pay | Admitting: Podiatry

## 2022-06-02 NOTE — Telephone Encounter (Signed)
Received a call from [employer] @ [facility] and she is asking for the pts last office visit note to be faxed to her. Her fax number is [phone]

## 2022-08-16 ENCOUNTER — Ambulatory Visit (INDEPENDENT_AMBULATORY_CARE_PROVIDER_SITE_OTHER): Payer: No Typology Code available for payment source | Admitting: Podiatry

## 2022-08-16 DIAGNOSIS — L989 Disorder of the skin and subcutaneous tissue, unspecified: Secondary | ICD-10-CM

## 2022-08-16 NOTE — Progress Notes (Signed)
Chief Complaint  Patient presents with   Diabetes    Diabetic foot care, nail trim and callus,     Subjective: 72 y.o. male presenting to the office today for routine diabetic footcare.  Patient states that he has been going to a pedicure salon which routinely debrides his nails.  He does not need his nails debrided but he does have symptomatic calluses to the plantar aspect of the bilateral feet.  Presenting for further treatment and evaluation   Past Medical History:  Diagnosis Date   Arthritis    KNEES AND ANKLES   BPH (benign prostatic hypertrophy)    Chronic pain    back, knees   Crutches as ambulation aid    Depression    Diabetic peripheral neuropathy ([place])    Erectile dysfunction    GERD (gastroesophageal reflux disease)    Glaucoma    BILATERAL   H/O agent Orange exposure    History of adenomatous polyp of colon    2008  &  2010       History of gastric ulcer    History of lower GI bleeding    2011--  POST COLONOSCOPY W/ POLYPECTOMY/    AND SEVERAL TIMES SINCE SECONDARY TO HEMORRHOIDS   History of prostate cancer    2009  ---S/P  EXTERNAL RADIATION THERAPY   History of seizure    1980'S--  SECONDARY TO MEDICATION--  NO ISSUE SINCE   Hyperlipidemia    Hypertension    Impaired hearing    BILATERAL   Mild asthma    Nocturia    OSA (obstructive sleep apnea)    NON-COMPLIANT CPAP--  SLEEP STUDY 2013  at  [place] in [place] traumatic stress disorder (PTSD)    HISTORY SUIDICE ATTEMPT AFTER [place]   Scrotal edema    Type 2 diabetes mellitus (HCC)    Urinary incontinence    Vision loss of right eye    Wears glasses     Past Surgical History:  Procedure Laterality Date   CATARACT EXTRACTION     CLOSED MANIPULATION RIGHT SHOULDER  09-29-2009   COLONOSCOPY W/ HEMORRHOID SURGERY  APRIL 2015   [facility] [place]  [facility]   OPEN LEFT KNEE PATELLA SURGERY  ,  [place] PROSTHESIS IMPLANT N/A 01/09/2013   Procedure: PENILE PROSTHESIS ;   Surgeon: Hanley Ben, MD;  Location: [facility];  Service: Urology;  Laterality: N/A;  Inflatable Penile Prosthesis Placement  (Coloplast) INFRAPUBIC APPROACH   RETINAL DETACHMENT SURGERY Right 2006   SCROTECTOMY N/A 12/28/2013   Procedure: SCROTOPLASTY;  Surgeon: Arvil Persons, MD;  [facility];  Service: Urology;  Laterality: N/A;   SHOULDER ARTHROSCOPY DEBRIDEMENT PARTIAL ROTATOR CUFF AND LABRAL TEAR/  SAD/  DISTAL CLAVICLE RESECTION Right 07-01-2009   TOTAL KNEE ARTHROPLASTY Right 1998   TRANSURETHRAL RESECTION OF PROSTATE  11-05-2005    Allergies  Allergen Reactions   Nsaids    Other Other (See Comments)    Non steroidal anti inflammatory analgesic - GI bleed     Objective:  Physical Exam General: Alert and oriented x3 in no acute distress  Dermatology: Hyperkeratotic lesion(s) present on the plantar aspect of the fifth MTP bilateral. Pain on palpation with a central nucleated core noted. Skin is warm, dry and supple bilateral lower extremities. Negative for open lesions or macerations.  Vascular: Palpable pedal pulses bilaterally. No edema or erythema noted. Capillary refill within normal limits.  Neurological: Epicritic and protective threshold grossly intact bilaterally.   Musculoskeletal Exam: Pain on palpation at the keratotic lesion(s) noted. Range of motion within normal limits bilateral. Muscle strength 5/5 in all groups bilateral.  Assessment: 1.  Symptomatic callus; benign skin lesion bilateral fifth MTP   Plan of Care:  1. Patient evaluated 2. Excisional debridement of keratoic lesion(s) using a chisel blade was performed without incident.  3.  Continue wearing good supportive shoes and sneakers 4. Patient is to return to the clinic PRN.   Edrick Kins, DPM [facility]  Dr. Edrick Kins, DPM    [address], [place] [address]                Office [phone]  Fax  [phone]

## 2022-09-13 ENCOUNTER — Ambulatory Visit: Payer: No Typology Code available for payment source | Admitting: Podiatry

## 2022-11-01 ENCOUNTER — Encounter: Payer: Self-pay | Admitting: Podiatry

## 2022-11-01 ENCOUNTER — Ambulatory Visit (INDEPENDENT_AMBULATORY_CARE_PROVIDER_SITE_OTHER): Payer: Medicare PPO | Admitting: Podiatry

## 2022-11-01 DIAGNOSIS — M79675 Pain in left toe(s): Secondary | ICD-10-CM | POA: Diagnosis not present

## 2022-11-01 DIAGNOSIS — M79674 Pain in right toe(s): Secondary | ICD-10-CM

## 2022-11-01 DIAGNOSIS — B351 Tinea unguium: Secondary | ICD-10-CM | POA: Diagnosis not present

## 2022-11-01 DIAGNOSIS — Q828 Other specified congenital malformations of skin: Secondary | ICD-10-CM

## 2022-11-01 DIAGNOSIS — E1142 Type 2 diabetes mellitus with diabetic polyneuropathy: Secondary | ICD-10-CM

## 2022-11-01 NOTE — Progress Notes (Signed)
  Subjective:  Patient ID: Christopher Riley, male    DOB: [birth year],  MRN: [id number]  Christopher Riley presents to clinic today for at risk foot care with history of diabetic neuropathy and painful porokeratotic lesion(s) both feet and painful mycotic toenails that limit ambulation. Painful toenails interfere with ambulation. Aggravating factors include wearing enclosed shoe gear. Pain is relieved with periodic professional debridement. Painful porokeratotic lesions are aggravated when weightbearing with and without shoegear. Pain is relieved with periodic professional debridement.  Chief Complaint  Patient presents with   Diabetes    DFC  BS - DON'T CHECK IT A1C - DK LVPCP - 2 WKS AGO   New problem(s): None.   PCP is Briscoe, Sharrie Rothman, MD.  Allergies  Allergen Reactions   Nsaids    Other Other (See Comments)    Non steroidal anti inflammatory analgesic - GI bleed    Review of Systems: Negative except as noted in the HPI.  Objective: No changes noted in today's physical examination. There were no vitals filed for this visit. Christopher Riley is a pleasant 72 y.o. male morbidly obese in NAD. AAO x 3.  Neurovascular Examination: CFT <3 seconds b/l LE. Palpable DP pulse(s) b/l LE. Palpable PT pulse(s) b/l LE. Pedal hair absent. No pain with calf compression b/l. Lower extremity skin temperature gradient within normal limits. Trace edema noted BLE. Evidence of chronic venous insufficiency b/l LE. No cyanosis or clubbing noted b/l LE.  Protective sensation diminished with 10g monofilament b/l.  Dermatological:  No open wounds b/l LE. No interdigital macerations noted b/l LE. Toenails 1-5 b/l elongated, discolored, dystrophic, thickened, crumbly with subungual debris and tenderness to dorsal palpation.  Porokeratotic lesion(s) submet head 5 b/l. No erythema, no edema, no drainage, no fluctuance.   Evidence of chronic venous insufficiency b/l lower extremities. Hyperpigmentation  consistent with findings of chronic venous insufficiency is present b/l lower extremities.    Musculoskeletal:  Muscle strength 5/5 to all lower extremity muscle groups bilaterally. Hallux hammertoe noted b/l LE. Wearing new diabetic shoes today. Utilizes rollator for ambulation assistance.  Assessment/Plan: 1. Pain due to onychomycosis of toenails of both feet   2. Porokeratosis   3. Diabetic peripheral neuropathy associated with type 2 diabetes mellitus     -Consent given for treatment as described below: -Examined patient. -Continue supportive shoe gear daily. -Toenails 1-5 b/l were debrided in length and girth with sterile nail nippers and dremel without iatrogenic bleeding.  -Porokeratotic lesion(s) submet head 5 b/l pared and enucleated with sterile currette without incident. Total number of lesions debrided=2. -Patient/POA to call should there be question/concern in the interim.   Return in about 3 months (around 01/31/2023).  Freddie Breech, DPM

## 2023-03-14 ENCOUNTER — Telehealth: Payer: Self-pay | Admitting: Podiatry

## 2023-03-14 NOTE — Telephone Encounter (Signed)
Pt left message today at 1256pm to cancel his appt for tomorrow.  I have cxled it.

## 2023-03-15 ENCOUNTER — Ambulatory Visit: Payer: No Typology Code available for payment source | Admitting: Podiatry

## 2023-03-25 ENCOUNTER — Other Ambulatory Visit: Payer: Self-pay

## 2023-03-25 ENCOUNTER — Encounter (HOSPITAL_COMMUNITY): Payer: Self-pay

## 2023-03-25 ENCOUNTER — Emergency Department (HOSPITAL_COMMUNITY)
Admission: EM | Admit: 2023-03-25 | Discharge: 2023-03-25 | Discharge status: Against Medical Advice | Payer: No Typology Code available for payment source | Attending: Emergency Medicine | Admitting: Emergency Medicine

## 2023-03-25 ENCOUNTER — Emergency Department (HOSPITAL_COMMUNITY)
Admission: EM | Admit: 2023-03-25 | Discharge: 2023-03-25 | Discharge status: Against Medical Advice | Payer: No Typology Code available for payment source

## 2023-03-25 DIAGNOSIS — S0990XA Unspecified injury of head, initial encounter: Secondary | ICD-10-CM | POA: Insufficient documentation

## 2023-03-25 DIAGNOSIS — W01198A Fall on same level from slipping, tripping and stumbling with subsequent striking against other object, initial encounter: Secondary | ICD-10-CM | POA: Insufficient documentation

## 2023-03-25 DIAGNOSIS — Z5321 Procedure and treatment not carried out due to patient leaving prior to being seen by health care provider: Secondary | ICD-10-CM | POA: Insufficient documentation

## 2023-03-25 NOTE — ED Triage Notes (Signed)
Pt had a fall last night from a sitting position while bending over. Pt states that he was on the floor for apprx 45 mins. Pt did hit his head, but pt states that he feels fine. Denies any blood thinners, [facility]

## 2023-03-25 NOTE — ED Notes (Signed)
Called 3x for triage. Eloped from waiting area prior to triage.

## 2023-08-17 NOTE — Progress Notes (Signed)
Sent message, via epic in basket, requesting orders in epic from surgeon.  

## 2023-08-18 ENCOUNTER — Ambulatory Visit: Payer: Self-pay | Admitting: Surgery

## 2023-08-18 DIAGNOSIS — Z01818 Encounter for other preprocedural examination: Secondary | ICD-10-CM

## 2023-08-18 NOTE — Progress Notes (Addendum)
COVID Vaccine received:  []  No [x]  Yes Date of any COVID positive Test in last 90 days:  PCP - Rosanne Sack, MD at [facility] Cardiologist - Harlan Stains, MD at [facility] Clearance on chart.   Chest x-ray -  EKG -  09-12-2020   Will repeat Stress Test - 05-25-2022  done at [facility]    ECHO -  Cardiac Cath -   PCR screen: []  Ordered & Completed []   No Order but Needs PROFEND     [x]   N/A for this surgery  Surgery Plan:  [x]  Ambulatory   []  Outpatient in bed  []  Admit Anesthesia:    [x]  General  []  Spinal  []   Choice []   MAC  Bowel Prep - []  No  [x]   Yes __2 fleets____  Pacemaker / ICD device [x]  No []  Yes   Spinal Cord Stimulator:[x]  No []  Yes       History of Sleep Apnea? []  No [x]  Yes   CPAP used?- [x]  No []  Yes    Does the patient monitor blood sugar?   []  N/A   []  No []  Yes  Patient has: []  NO Hx DM   [x]  Pre-DM   []  DM1  []   DM2 Last A1c was:        on      Does patient have a [employer] or Dexacom? []  No []  Yes   Fasting Blood Sugar Ranges-  Checks Blood Sugar _____ times a day JARDIANCE- Hold x 72 hours.   Blood Thinner / Instructions:  Eliquis  Patient refuses to [facility].  Aspirin Instructions:  none  ERAS Protocol Ordered: []  No  [x]  Yes PRE-SURGERY []  ENSURE  [x]  G2  Patient is to be NPO after:    1230   Dental hx: []  Dentures:  []  N/A      []  Bridge or Partial:                   []  Loose or Damaged teeth:   Comments: Sees [facility] for both medical and cardiology.  Marcelino Duster at Dr. Lucilla Lame office will send me what info she has on clearance, Eliquis hold. CARDIAC CLEARANCE ON CHART and Scanned to Media  Activity level: Patient is able / unable to climb a flight of stairs without difficulty; []  No CP  []  No SOB, but would have ___   Patient can / can not perform ADLs without assistance.   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  Patient verbalized understanding and agreement to the Pre-Surgical Instructions that were given to them at this PAT appointment. Patient was also educated of the need to review these PAT instructions again prior to his/her surgery.I reviewed the appropriate phone numbers to call if they have any and questions or concerns.

## 2023-08-18 NOTE — Patient Instructions (Addendum)
SURGICAL WAITING ROOM VISITATION Patients having surgery or a procedure may have no more than 2 support people in the waiting area - these visitors may rotate in the visitor waiting room.   Due to an increase in RSV and influenza rates and associated hospitalizations, children ages 56 and under may not visit patients in [facility] hospitals. If the patient needs to stay at the hospital during part of their recovery, the visitor guidelines for inpatient rooms apply.  PRE-OP VISITATION  Pre-op nurse will coordinate an appropriate time for 1 support person to accompany the patient in pre-op.  This support person may not rotate.  This visitor will be contacted when the time is appropriate for the visitor to come back in the pre-op area.  Please refer to the [facility] website for the visitor guidelines for Inpatients (after your surgery is over and you are in a regular room).  You are not required to quarantine at this time prior to your surgery. However, you must do this: Hand Hygiene often Do NOT share personal items Notify your provider if you are in close contact with someone who has COVID or you develop fever 100.4 or greater, new onset of sneezing, cough, sore throat, shortness of breath or body aches.  If you test positive for Covid or have been in contact with anyone that has tested positive in the last 10 days please notify you surgeon.    Your procedure is scheduled on:  Friday  August 26, 2023  Report to [facility] Main Entrance: Leota Jacobsen entrance where the [employer] is available.   Report to admitting at:  1:15   PM  Call this number if you have any questions or problems the morning of surgery [phone]   FOLLOW ANY ADDITIONAL PRE OP INSTRUCTIONS YOU RECEIVED FROM YOUR SURGEON'S OFFICE!!!  FLEET ENEMA: Obtain one(1) Fleet Enema (sodium phosphate 7-19 gm / 118 ml enema) and use (according to the directions on the box) the night prior to your surgery.   FLEET  ENEMA: Obtain one(1) Fleet Enema (sodium phosphate 7-19 gm / 118 ml enema) and use (according to the directions on the box) the MORNING ON YOUR SURGERY.   If you have any questions, please contact your Surgeon's office for additional information.   Do not eat food after Midnight the night prior to your surgery/procedure.   After Midnight you may have the following liquids until  12:30  PM DAY OF SURGERY  Clear Liquid Diet Water Black Coffee (sugar ok, NO MILK/CREAM OR CREAMERS)  Tea (sugar ok, NO MILK/CREAM OR CREAMERS) regular and decaf                             Plain Jell-O  with no fruit (NO RED)                                           Fruit ices (not with fruit pulp, NO RED)                                     Popsicles (NO RED)  Juice: NO CITRUS JUICES: only apple, WHITE grape, WHITE cranberry Sports drinks like Gatorade or Powerade (NO RED)                   The day of surgery:  Drink ONE (1) Pre-Surgery G2 at  12:30  PM the morning of surgery. Drink in one sitting. Do not sip.  This drink was given to you during your hospital pre-op appointment visit. Nothing else to drink after completing the Pre-Surgery G2 : No candy, chewing gum or throat lozenges.     Oral Hygiene is also important to reduce your risk of infection.        Remember - BRUSH YOUR TEETH THE MORNING OF SURGERY WITH YOUR REGULAR TOOTHPASTE  Do NOT smoke after Midnight the night before surgery.  STOP TAKING all Vitamins, Herbs and supplements 1 week before your surgery.   Empagliflozin (JARDIANCE)  Stop taking this medication 72 hours before your surgery.  Last dose will be taken on: Monday 08-22-2023  Take ONLY these medicines the morning of surgery with A SIP OF WATER: Tylenol, amlodipine, carvedilol, gabapentin. You may use your Albuterol inhaler if needed (Please bring you inhaler with you on your Surgery day)   If You have been  diagnosed with Sleep Apnea - Bring CPAP mask and tubing day of surgery. We will provide you with a CPAP machine on the day of your surgery.                   You may not have any metal on your body including jewelry, and body piercing  Do not wear lotions, powders, cologne, or deodorant  Men may shave face and neck.  Contacts, Hearing Aids, dentures or bridgework may not be worn into surgery. DENTURES WILL BE REMOVED PRIOR TO SURGERY PLEASE DO NOT APPLY "Poly grip" OR ADHESIVES!!!  Patients discharged on the day of surgery will not be allowed to drive home.  Someone NEEDS to stay with you for the first 24 hours after anesthesia.  Do not bring your home medications to the hospital. The Pharmacy will dispense medications listed on your medication list to you during your admission in the Hospital.  Please read over the following fact sheets you were given: IF YOU HAVE QUESTIONS ABOUT YOUR PRE-OP INSTRUCTIONS, PLEASE CALL [phone]   [facility] - Preparing for Surgery Before surgery, you can play an important role.  Because skin is not sterile, your skin needs to be as free of germs as possible.  You can reduce the number of germs on your skin by washing with CHG (chlorahexidine gluconate) soap before surgery.  CHG is an antiseptic cleaner which kills germs and bonds with the skin to continue killing germs even after washing. Please DO NOT use if you have an allergy to CHG or antibacterial soaps.  If your skin becomes reddened/irritated stop using the CHG and inform your nurse when you arrive at Short Stay. Do not shave (including legs and underarms) for at least 48 hours prior to the first CHG shower.  You may shave your face/neck.  Please follow these instructions carefully:  1.  Shower with CHG Soap the night before surgery and the  morning of surgery.  2.  If you choose to wash your hair, wash your hair first as usual with your normal  shampoo.  3.  After you shampoo, rinse your hair and  body thoroughly to remove the shampoo.  4.  Use CHG as you would any other liquid soap.  You can apply chg directly to the skin and wash.  Gently with a scrungie or clean washcloth.  5.  Apply the CHG Soap to your body ONLY FROM THE NECK DOWN.   Do not use on face/ open                           Wound or open sores. Avoid contact with eyes, ears mouth and genitals (private parts).                       Wash face,  Genitals (private parts) with your normal soap.             6.  Wash thoroughly, paying special attention to the area where your  surgery  will be performed.  7.  Thoroughly rinse your body with warm water from the neck down.  8.  DO NOT shower/wash with your normal soap after using and rinsing off the CHG Soap.            9.  Pat yourself dry with a clean towel.            10.  Wear clean pajamas.            11.  Place clean sheets on your bed the night of your first shower and do not  sleep with pets.  ON THE DAY OF SURGERY : Do not apply any lotions/deodorants the morning of surgery.  Please wear clean clothes to the hospital/surgery center.     FAILURE TO FOLLOW THESE INSTRUCTIONS MAY RESULT IN THE CANCELLATION OF YOUR SURGERY  PATIENT SIGNATURE_________________________________  NURSE SIGNATURE__________________________________  ________________________________________________________________________

## 2023-08-19 ENCOUNTER — Encounter (HOSPITAL_COMMUNITY)
Admission: RE | Admit: 2023-08-19 | Discharge: 2023-08-19 | Disposition: A | Discharge status: Home / Self Care | Payer: No Typology Code available for payment source | Source: Ambulatory Visit | Attending: Surgery

## 2023-08-19 DIAGNOSIS — Z01818 Encounter for other preprocedural examination: Secondary | ICD-10-CM

## 2023-08-19 DIAGNOSIS — I10 Essential (primary) hypertension: Secondary | ICD-10-CM

## 2023-08-19 DIAGNOSIS — R7303 Prediabetes: Secondary | ICD-10-CM

## 2023-08-19 DIAGNOSIS — Z79899 Other long term (current) drug therapy: Secondary | ICD-10-CM

## 2023-08-23 ENCOUNTER — Encounter (HOSPITAL_COMMUNITY): Admission: RE | Admit: 2023-08-23 | Payer: No Typology Code available for payment source | Source: Ambulatory Visit

## 2023-08-26 ENCOUNTER — Encounter (HOSPITAL_COMMUNITY): Admission: RE | Payer: Self-pay | Source: Home / Self Care

## 2023-08-26 ENCOUNTER — Ambulatory Visit (HOSPITAL_COMMUNITY)
Admission: RE | Admit: 2023-08-26 | Payer: No Typology Code available for payment source | Source: Home / Self Care | Admitting: Surgery

## 2023-08-26 SURGERY — HEMORRHOIDECTOMY
Anesthesia: General

## 2023-09-12 NOTE — Progress Notes (Signed)
Anesthesia Review:  PCP: Cardiologist : Chest x-ray : EKG : Echo : Stress test: Cardiac Cath :  Activity level:  Sleep Study/ CPAP : Fasting Blood Sugar :      / Checks Blood Sugar -- times a day:   Blood Thinner/ Instructions /Last Dose: ASA / Instructions/ Last Dose :    N1/31/25- surgery cancelled

## 2023-09-13 NOTE — Patient Instructions (Signed)
SURGICAL WAITING ROOM VISITATION  Patients having surgery or a procedure may have no more than 2 support people in the waiting area - these visitors may rotate.    Children under the age of 67 must have an adult with them who is not the patient.  Due to an increase in RSV and influenza rates and associated hospitalizations, children ages 35 and under may not visit patients in [facility] hospitals.  Visitors with respiratory illnesses are discouraged from visiting and should remain at home.  If the patient needs to stay at the hospital during part of their recovery, the visitor guidelines for inpatient rooms apply. Pre-op nurse will coordinate an appropriate time for 1 support person to accompany patient in pre-op.  This support person may not rotate.    Please refer to the [facility] website for the visitor guidelines for Inpatients (after your surgery is over and you are in a regular room).       Your procedure is scheduled on:  09/19/2023    Report to [facility] Main Entrance    Report to admitting at  1115AM   Call this number if you have problems the morning of surgery [phone]   Do not eat food or drink liquids  :After Midnight.                           If you have questions, please contact your surgeon's office.      Oral Hygiene is also important to reduce your risk of infection.                                    Remember - BRUSH YOUR TEETH THE MORNING OF SURGERY WITH YOUR REGULAR TOOTHPASTE  DENTURES WILL BE REMOVED PRIOR TO SURGERY PLEASE DO NOT APPLY "Poly grip" OR ADHESIVES!!!   Do NOT smoke after Midnight   Stop all vitamins and herbal supplements 7 days before surgery.   Take these medicines the morning of surgery with A SIP OF WATER:  Inhalers as usual and brig, amlodipine, coreg, eye drops as usual , omeprazole, lexapro   DO NOT TAKE ANY ORAL DIABETIC MEDICATIONS DAY OF YOUR SURGERY  Bring CPAP mask and tubing day of surgery.                               You may not have any metal on your body including hair pins, jewelry, and body piercing             Do not wear make-up, lotions, powders, perfumes/cologne, or deodorant  Do not wear nail polish including gel and S&S, artificial/acrylic nails, or any other type of covering on natural nails including finger and toenails. If you have artificial nails, gel coating, etc. that needs to be removed by a nail salon please have this removed prior to surgery or surgery may need to be canceled/ delayed if the surgeon/ anesthesia feels like they are unable to be safely monitored.   Do not shave  48 hours prior to surgery.               Men may shave face and neck.   Do not bring valuables to the hospital. [facility] IS NOT             RESPONSIBLE   FOR  VALUABLES.   Contacts, glasses, dentures or bridgework may not be worn into surgery.   Bring small overnight bag day of surgery.   DO NOT BRING YOUR HOME MEDICATIONS TO THE HOSPITAL. PHARMACY WILL DISPENSE MEDICATIONS LISTED ON YOUR MEDICATION LIST TO YOU DURING YOUR ADMISSION IN THE HOSPITAL!    Patients discharged on the day of surgery will not be allowed to drive home.  Someone NEEDS to stay with you for the first 24 hours after anesthesia.   Special Instructions: Bring a copy of your healthcare power of attorney and living will documents the day of surgery if you haven't scanned them before.              Please read over the following fact sheets you were given: IF YOU HAVE QUESTIONS ABOUT YOUR PRE-OP INSTRUCTIONS PLEASE CALL [phone]   If you received a COVID test during your pre-op visit  it is requested that you wear a mask when out in public, stay away from anyone that may not be feeling well and notify your surgeon if you develop symptoms. If you test positive for Covid or have been in contact with anyone that has tested positive in the last 10 days please notify you surgeon.    [facility] - Preparing for  Surgery Before surgery, you can play an important role.  Because skin is not sterile, your skin needs to be as free of germs as possible.  You can reduce the number of germs on your skin by washing with CHG (chlorahexidine gluconate) soap before surgery.  CHG is an antiseptic cleaner which kills germs and bonds with the skin to continue killing germs even after washing. Please DO NOT use if you have an allergy to CHG or antibacterial soaps.  If your skin becomes reddened/irritated stop using the CHG and inform your nurse when you arrive at Short Stay. Do not shave (including legs and underarms) for at least 48 hours prior to the first CHG shower.  You may shave your face/neck. Please follow these instructions carefully:  1.  Shower with CHG Soap the night before surgery and the  morning of Surgery.  2.  If you choose to wash your hair, wash your hair first as usual with your  normal  shampoo.  3.  After you shampoo, rinse your hair and body thoroughly to remove the  shampoo.                           4.  Use CHG as you would any other liquid soap.  You can apply chg directly  to the skin and wash                       Gently with a scrungie or clean washcloth.  5.  Apply the CHG Soap to your body ONLY FROM THE NECK DOWN.   Do not use on face/ open                           Wound or open sores. Avoid contact with eyes, ears mouth and genitals (private parts).                       Wash face,  Genitals (private parts) with your normal soap.             6.  Wash thoroughly, paying special attention to  the area where your surgery  will be performed.  7.  Thoroughly rinse your body with warm water from the neck down.  8.  DO NOT shower/wash with your normal soap after using and rinsing off  the CHG Soap.                9.  Pat yourself dry with a clean towel.            10.  Wear clean pajamas.            11.  Place clean sheets on your bed the night of your first shower and do not  sleep with pets. Day  of Surgery : Do not apply any lotions/deodorants the morning of surgery.  Please wear clean clothes to the hospital/surgery center.  FAILURE TO FOLLOW THESE INSTRUCTIONS MAY RESULT IN THE CANCELLATION OF YOUR SURGERY PATIENT SIGNATURE_________________________________  NURSE SIGNATURE__________________________________  ________________________________________________________________________

## 2023-09-14 ENCOUNTER — Other Ambulatory Visit: Payer: Self-pay

## 2023-09-14 ENCOUNTER — Encounter (HOSPITAL_COMMUNITY)
Admission: RE | Admit: 2023-09-14 | Discharge: 2023-09-14 | Disposition: A | Discharge status: Home / Self Care | Payer: No Typology Code available for payment source | Source: Ambulatory Visit | Attending: Surgery

## 2023-09-14 ENCOUNTER — Encounter (HOSPITAL_COMMUNITY): Payer: Self-pay

## 2023-09-14 VITALS — BP 170/85 | HR 88 | Temp 98.2°F | Resp 16 | Ht 74.0 in

## 2023-09-14 DIAGNOSIS — E119 Type 2 diabetes mellitus without complications: Secondary | ICD-10-CM | POA: Insufficient documentation

## 2023-09-14 DIAGNOSIS — F4310 Post-traumatic stress disorder, unspecified: Secondary | ICD-10-CM | POA: Diagnosis not present

## 2023-09-14 DIAGNOSIS — K649 Unspecified hemorrhoids: Secondary | ICD-10-CM | POA: Diagnosis not present

## 2023-09-14 DIAGNOSIS — I509 Heart failure, unspecified: Secondary | ICD-10-CM | POA: Insufficient documentation

## 2023-09-14 DIAGNOSIS — I110 Hypertensive heart disease with heart failure: Secondary | ICD-10-CM | POA: Diagnosis not present

## 2023-09-14 DIAGNOSIS — Z01818 Encounter for other preprocedural examination: Secondary | ICD-10-CM | POA: Insufficient documentation

## 2023-09-14 DIAGNOSIS — Z7984 Long term (current) use of oral hypoglycemic drugs: Secondary | ICD-10-CM | POA: Insufficient documentation

## 2023-09-14 DIAGNOSIS — G4733 Obstructive sleep apnea (adult) (pediatric): Secondary | ICD-10-CM | POA: Insufficient documentation

## 2023-09-14 HISTORY — DX: Anemia, unspecified: D64.9

## 2023-09-14 HISTORY — DX: Paroxysmal atrial fibrillation: I48.0

## 2023-09-14 HISTORY — DX: Heart failure, unspecified: I50.9

## 2023-09-14 LAB — BASIC METABOLIC PANEL
Anion gap: 6 (ref 5–15)
BUN: 15 mg/dL (ref 8–23)
CO2: 26 mmol/L (ref 22–32)
Calcium: 8.7 mg/dL — ABNORMAL LOW (ref 8.9–10.3)
Chloride: 106 mmol/L (ref 98–111)
Creatinine, Ser: 0.93 mg/dL (ref 0.61–1.24)
GFR, Estimated: >60 mL/min (ref >60)
Glucose, Bld: 101 mg/dL — ABNORMAL HIGH (ref 70–99)
Potassium: 4.2 mmol/L (ref 3.5–5.1)
Sodium: 138 mmol/L (ref 135–145)

## 2023-09-14 LAB — CBC
HCT: 42.4 % (ref 39.0–52.0)
Hemoglobin: 12.9 g/dL — ABNORMAL LOW (ref 13.0–17.0)
MCH: 25.7 pg — ABNORMAL LOW (ref 26.0–34.0)
MCHC: 30.4 g/dL (ref 30.0–36.0)
MCV: 84.5 fL (ref 80.0–100.0)
Platelets: 306 10*3/uL (ref 150–400)
RBC: 5.02 MIL/uL (ref 4.22–5.81)
RDW: 15.7 % — ABNORMAL HIGH (ref 11.5–15.5)
WBC: 8.7 10*3/uL (ref 4.0–10.5)
nRBC: 0 % (ref 0.0–0.2)

## 2023-09-14 LAB — GLUCOSE, CAPILLARY: Glucose-Capillary: 98 mg/dL (ref 70–99)

## 2023-09-14 LAB — HEMOGLOBIN A1C
Hgb A1c MFr Bld: 6.5 % — ABNORMAL HIGH (ref 4.8–5.6)
Mean Plasma Glucose: 139.85 mg/dL

## 2023-09-15 ENCOUNTER — Encounter (HOSPITAL_COMMUNITY): Payer: Self-pay

## 2023-09-15 NOTE — Anesthesia Preprocedure Evaluation (Addendum)
 Anesthesia Evaluation  Patient identified by MRN, date of birth, ID band Patient awake    Reviewed: Allergy & Precautions, NPO status , Patient's Chart, lab work & pertinent test results, reviewed documented beta blocker date and time   Airway Mallampati: III  TM Distance: >3 FB     Dental  (+) Poor Dentition, Dental Advisory Given   Pulmonary sleep apnea and Continuous Positive Airway Pressure Ventilation    breath sounds clear to auscultation + decreased breath sounds      Cardiovascular hypertension, Pt. on medications +CHF  Normal cardiovascular exam Rhythm:Regular Rate:Normal  EKG 09/14/23 NSR, LAD   Neuro/Psych  PSYCHIATRIC DISORDERS Anxiety Depression    PTSD Neuromuscular disease    GI/Hepatic ,GERD  Medicated,,Hemorrhoids   Endo/Other  diabetes, Well Controlled, Type 2, Oral Hypoglycemic Agents  Class 4 obesity  Renal/GU Renal disease Bladder dysfunction  Urinary incontinence    Musculoskeletal  (+) Arthritis , Osteoarthritis,  Chronic LBP   Abdominal  (+) + obese  Peds  Hematology  (+) Blood dyscrasia, anemia   Anesthesia Other Findings   Reproductive/Obstetrics                              Anesthesia Physical Anesthesia Plan  ASA: 3  Anesthesia Plan: General   Post-op Pain Management: Dilaudid IV, Precedex and Ofirmev IV (intra-op)*   Induction: Intravenous and Cricoid pressure planned  PONV Risk Score and Plan: 3 and Treatment may vary due to age or medical condition and Ondansetron  Airway Management Planned: Oral ETT  Additional Equipment: None  Intra-op Plan:   Post-operative Plan: Extubation in OR  Informed Consent: I have reviewed the patients History and Physical, chart, labs and discussed the procedure including the risks, benefits and alternatives for the proposed anesthesia with the patient or authorized representative who has indicated his/her  understanding and acceptance.     Dental advisory given  Plan Discussed with: CRNA and Anesthesiologist  Anesthesia Plan Comments: (See PAT note 09/14/2023)        Anesthesia Quick Evaluation
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
## 2023-09-19 ENCOUNTER — Encounter (HOSPITAL_COMMUNITY)
Admission: RE | Disposition: A | Discharge status: Home / Self Care | Payer: Self-pay | Source: Home / Self Care | Attending: Surgery

## 2023-09-19 ENCOUNTER — Ambulatory Visit (HOSPITAL_BASED_OUTPATIENT_CLINIC_OR_DEPARTMENT_OTHER): Payer: No Typology Code available for payment source | Admitting: Certified Registered"

## 2023-09-19 ENCOUNTER — Other Ambulatory Visit: Payer: Self-pay

## 2023-09-19 ENCOUNTER — Ambulatory Visit (HOSPITAL_COMMUNITY): Payer: No Typology Code available for payment source | Admitting: Physician Assistant

## 2023-09-19 ENCOUNTER — Ambulatory Visit (HOSPITAL_COMMUNITY)
Admission: RE | Admit: 2023-09-19 | Discharge: 2023-09-19 | Disposition: A | Discharge status: Home / Self Care | Payer: No Typology Code available for payment source | Attending: Surgery | Admitting: Surgery

## 2023-09-19 ENCOUNTER — Encounter (HOSPITAL_COMMUNITY): Payer: Self-pay | Admitting: Surgery

## 2023-09-19 ENCOUNTER — Other Ambulatory Visit (HOSPITAL_COMMUNITY): Payer: Self-pay

## 2023-09-19 DIAGNOSIS — K648 Other hemorrhoids: Secondary | ICD-10-CM

## 2023-09-19 DIAGNOSIS — Z01818 Encounter for other preprocedural examination: Secondary | ICD-10-CM

## 2023-09-19 DIAGNOSIS — E1142 Type 2 diabetes mellitus with diabetic polyneuropathy: Secondary | ICD-10-CM | POA: Insufficient documentation

## 2023-09-19 DIAGNOSIS — I509 Heart failure, unspecified: Secondary | ICD-10-CM | POA: Diagnosis not present

## 2023-09-19 DIAGNOSIS — F418 Other specified anxiety disorders: Secondary | ICD-10-CM

## 2023-09-19 DIAGNOSIS — Z7984 Long term (current) use of oral hypoglycemic drugs: Secondary | ICD-10-CM | POA: Insufficient documentation

## 2023-09-19 DIAGNOSIS — G4733 Obstructive sleep apnea (adult) (pediatric): Secondary | ICD-10-CM | POA: Insufficient documentation

## 2023-09-19 DIAGNOSIS — I110 Hypertensive heart disease with heart failure: Secondary | ICD-10-CM

## 2023-09-19 DIAGNOSIS — E6689 Other obesity not elsewhere classified: Secondary | ICD-10-CM | POA: Insufficient documentation

## 2023-09-19 DIAGNOSIS — K219 Gastro-esophageal reflux disease without esophagitis: Secondary | ICD-10-CM | POA: Insufficient documentation

## 2023-09-19 HISTORY — PX: RECTAL EXAM UNDER ANESTHESIA: SHX6399

## 2023-09-19 HISTORY — PX: HEMORRHOID SURGERY: SHX153

## 2023-09-19 LAB — GLUCOSE, CAPILLARY: Glucose-Capillary: 98 mg/dL (ref 70–99)

## 2023-09-19 SURGERY — HEMORRHOIDECTOMY
Anesthesia: General

## 2023-09-19 MED ORDER — PROPOFOL 10 MG/ML IV BOLUS
INTRAVENOUS | Status: AC
  Filled 2023-09-19: qty 20

## 2023-09-19 MED ORDER — BUPIVACAINE-EPINEPHRINE 0.25% -1:200000 IJ SOLN
INTRAMUSCULAR | Status: AC
  Filled 2023-09-19: qty 1

## 2023-09-19 MED ORDER — BUPIVACAINE-EPINEPHRINE (PF) 0.5% -1:200000 IJ SOLN
INTRAMUSCULAR | Status: AC
  Filled 2023-09-19: qty 30

## 2023-09-19 MED ORDER — PROPOFOL 10 MG/ML IV BOLUS
INTRAVENOUS | Status: DC | PRN
  Administered 2023-09-19: 140 mg via INTRAVENOUS

## 2023-09-19 MED ORDER — DEXAMETHASONE SODIUM PHOSPHATE 10 MG/ML IJ SOLN
INTRAMUSCULAR | Status: AC
  Filled 2023-09-19: qty 1

## 2023-09-19 MED ORDER — FENTANYL CITRATE (PF) 100 MCG/2ML IJ SOLN
INTRAMUSCULAR | Status: AC
Start: 2023-09-19 — End: ?
  Filled 2023-09-19: qty 2

## 2023-09-19 MED ORDER — SODIUM CHLORIDE 0.9 % IV SOLN
2.0000 g | INTRAVENOUS | Status: AC
  Administered 2023-09-19: 2 g via INTRAVENOUS
  Filled 2023-09-19: qty 2

## 2023-09-19 MED ORDER — SUGAMMADEX SODIUM 200 MG/2ML IV SOLN
INTRAVENOUS | Status: DC | PRN
  Administered 2023-09-19: 320 mg via INTRAVENOUS

## 2023-09-19 MED ORDER — ONDANSETRON HCL 4 MG/2ML IJ SOLN: 4.0000 mg | Freq: Once | INTRAMUSCULAR | Status: DC | PRN

## 2023-09-19 MED ORDER — MIDAZOLAM HCL 2 MG/2ML IJ SOLN
INTRAMUSCULAR | Status: DC | PRN
  Administered 2023-09-19: 2 mg via INTRAVENOUS

## 2023-09-19 MED ORDER — HYDROMORPHONE HCL 1 MG/ML IJ SOLN
0.2500 mg | INTRAMUSCULAR | Status: DC | PRN
  Administered 2023-09-19: 0.5 mg via INTRAVENOUS

## 2023-09-19 MED ORDER — TRAMADOL HCL 50 MG PO TABS
50.0000 mg | ORAL_TABLET | Freq: Four times a day (QID) | ORAL | 0 refills | Status: AC | PRN
  Filled 2023-09-19: qty 10, 3d supply, fill #0

## 2023-09-19 MED ORDER — BUPIVACAINE LIPOSOME 1.3 % IJ SUSP
INTRAMUSCULAR | Status: DC | PRN
  Administered 2023-09-19: 20 mL

## 2023-09-19 MED ORDER — MIDAZOLAM HCL 2 MG/2ML IJ SOLN
INTRAMUSCULAR | Status: AC
  Filled 2023-09-19: qty 2

## 2023-09-19 MED ORDER — ROCURONIUM BROMIDE 10 MG/ML (PF) SYRINGE
PREFILLED_SYRINGE | INTRAVENOUS | Status: AC
  Filled 2023-09-19: qty 10

## 2023-09-19 MED ORDER — ORAL CARE MOUTH RINSE: 15.0000 mL | Freq: Once | OROMUCOSAL | Status: AC

## 2023-09-19 MED ORDER — ACETAMINOPHEN 500 MG PO TABS
1000.0000 mg | ORAL_TABLET | ORAL | Status: AC
  Administered 2023-09-19: 1000 mg via ORAL
  Filled 2023-09-19: qty 2

## 2023-09-19 MED ORDER — OXYCODONE HCL 5 MG/5ML PO SOLN: 5.0000 mg | Freq: Once | ORAL | Status: DC | PRN

## 2023-09-19 MED ORDER — CHLORHEXIDINE GLUCONATE CLOTH 2 % EX PADS: 6.0000 | MEDICATED_PAD | Freq: Once | CUTANEOUS | Status: DC

## 2023-09-19 MED ORDER — CHLORHEXIDINE GLUCONATE 0.12 % MT SOLN
15.0000 mL | Freq: Once | OROMUCOSAL | Status: AC
  Administered 2023-09-19: 15 mL via OROMUCOSAL

## 2023-09-19 MED ORDER — BUPIVACAINE LIPOSOME 1.3 % IJ SUSP
INTRAMUSCULAR | Status: AC
Start: 2023-09-19 — End: ?
  Filled 2023-09-19: qty 20

## 2023-09-19 MED ORDER — METHYLENE BLUE (ANTIDOTE) 1 % IV SOLN
INTRAVENOUS | Status: AC
  Filled 2023-09-19: qty 10

## 2023-09-19 MED ORDER — OXYCODONE HCL 5 MG PO TABS: 5.0000 mg | ORAL_TABLET | Freq: Once | ORAL | Status: DC | PRN

## 2023-09-19 MED ORDER — BUPIVACAINE LIPOSOME 1.3 % IJ SUSP: 20.0000 mL | Freq: Once | INTRAMUSCULAR | Status: DC

## 2023-09-19 MED ORDER — ONDANSETRON HCL 4 MG/2ML IJ SOLN
INTRAMUSCULAR | Status: DC | PRN
  Administered 2023-09-19: 4 mg via INTRAVENOUS

## 2023-09-19 MED ORDER — LACTATED RINGERS IV SOLN: INTRAVENOUS | Status: DC

## 2023-09-19 MED ORDER — ROCURONIUM BROMIDE 10 MG/ML (PF) SYRINGE
PREFILLED_SYRINGE | INTRAVENOUS | Status: DC | PRN
  Administered 2023-09-19: 70 mg via INTRAVENOUS

## 2023-09-19 MED ORDER — FENTANYL CITRATE (PF) 100 MCG/2ML IJ SOLN
INTRAMUSCULAR | Status: DC | PRN
  Administered 2023-09-19: 100 ug via INTRAVENOUS

## 2023-09-19 MED ORDER — HYDROMORPHONE HCL 1 MG/ML IJ SOLN
INTRAMUSCULAR | Status: AC
  Administered 2023-09-19: 0.5 mg via INTRAVENOUS
  Filled 2023-09-19: qty 1

## 2023-09-19 MED ORDER — BUPIVACAINE-EPINEPHRINE 0.25% -1:200000 IJ SOLN
INTRAMUSCULAR | Status: DC | PRN
  Administered 2023-09-19: 30 mL

## 2023-09-19 MED ORDER — LIDOCAINE HCL (PF) 2 % IJ SOLN
INTRAMUSCULAR | Status: AC
  Filled 2023-09-19: qty 5

## 2023-09-19 MED ORDER — LIDOCAINE HCL (PF) 2 % IJ SOLN
INTRAMUSCULAR | Status: DC | PRN
  Administered 2023-09-19: 100 mg via INTRADERMAL

## 2023-09-19 MED ORDER — DIBUCAINE (PERIANAL) 1 % EX OINT
TOPICAL_OINTMENT | CUTANEOUS | Status: AC
Start: 2023-09-19 — End: ?
  Filled 2023-09-19: qty 28

## 2023-09-19 MED ORDER — ONDANSETRON HCL 4 MG/2ML IJ SOLN
INTRAMUSCULAR | Status: AC
  Filled 2023-09-19: qty 2

## 2023-09-19 SURGICAL SUPPLY — 35 items
BAG COUNTER SPONGE SURGICOUNT (BAG) IMPLANT
BENZOIN TINCTURE PRP APPL 2/3 (GAUZE/BANDAGES/DRESSINGS) ×1 IMPLANT
BLADE SURG 15 STRL LF DISP TIS (BLADE) IMPLANT
BRIEF MESH DISP LRG (UNDERPADS AND DIAPERS) ×1 IMPLANT
CNTNR URN SCR LID CUP LEK RST (MISCELLANEOUS) ×1 IMPLANT
COVER SURGICAL LIGHT HANDLE (MISCELLANEOUS) ×1 IMPLANT
DISSECTOR SURG LIGASURE 21 (MISCELLANEOUS) IMPLANT
DRAPE LAPAROTOMY T 102X78X121 (DRAPES) ×1 IMPLANT
ELECT NDL BLADE 2-5/6 (NEEDLE) ×1 IMPLANT
ELECT NEEDLE BLADE 2-5/6 (NEEDLE)
ELECT REM PT RETURN 15FT ADLT (MISCELLANEOUS) ×1 IMPLANT
GAUZE 4X4 16PLY ~~LOC~~+RFID DBL (SPONGE) ×1 IMPLANT
GAUZE PAD ABD 8X10 STRL (GAUZE/BANDAGES/DRESSINGS) IMPLANT
GAUZE SPONGE 4X4 12PLY STRL (GAUZE/BANDAGES/DRESSINGS) IMPLANT
GLOVE BIO SURGEON STRL SZ7.5 (GLOVE) ×1 IMPLANT
GLOVE INDICATOR 8.0 STRL GRN (GLOVE) ×1 IMPLANT
GOWN STRL REUS W/ TWL XL LVL3 (GOWN DISPOSABLE) ×2 IMPLANT
KIT BASIN OR (CUSTOM PROCEDURE TRAY) ×1 IMPLANT
KIT TURNOVER KIT A (KITS) IMPLANT
LOOP VESSEL MAXI BLUE (MISCELLANEOUS) IMPLANT
NDL HYPO 22X1.5 SAFETY MO (MISCELLANEOUS) ×1 IMPLANT
NEEDLE HYPO 22X1.5 SAFETY MO (MISCELLANEOUS) ×1
PACK BASIC VI WITH GOWN DISP (CUSTOM PROCEDURE TRAY) ×1 IMPLANT
PENCIL SMOKE EVACUATOR (MISCELLANEOUS) IMPLANT
SHEARS HARMONIC 9CM CVD (BLADE) IMPLANT
SPIKE FLUID TRANSFER (MISCELLANEOUS) ×1 IMPLANT
SPONGE HEMORRHOID 8X3CM (HEMOSTASIS) IMPLANT
SURGILUBE 2OZ TUBE FLIPTOP (MISCELLANEOUS) ×1 IMPLANT
SUT CHROMIC 2 0 SH (SUTURE) ×1 IMPLANT
SUT CHROMIC 3 0 SH 27 (SUTURE) IMPLANT
SUT VIC AB 2-0 SH 27X BRD (SUTURE) IMPLANT
SUT VIC AB 2-0 UR6 27 (SUTURE) ×6 IMPLANT
SYR 20ML LL LF (SYRINGE) ×1 IMPLANT
SYR 3ML LL SCALE MARK (SYRINGE) IMPLANT
TOWEL OR 17X26 10 PK STRL BLUE (TOWEL DISPOSABLE) ×1 IMPLANT
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
## 2023-09-19 NOTE — Op Note (Addendum)
 09/19/2023  1:22 PM  PATIENT:  Christopher Riley  73 y.o. male  Patient Care Team: [facility] as PCP - General (General Practice)  PRE-OPERATIVE DIAGNOSIS: Hemorrhoids, medically refractory  POST-OPERATIVE DIAGNOSIS: Internal hemorrhoids  PROCEDURE:   1.  Hemorrhoidectomy, internal 2.  Anorectal exam under anesthesia  SURGEON:  Surgeon(s): Andria Meuse, MD  ASSISTANT: OR Staff   ANESTHESIA:   local and general  SPECIMEN: Left lateral internal hemorrhoidal tissue  DISPOSITION OF SPECIMEN:  PATHOLOGY  COUNTS:  Sponge, needle, and instrument counts were reported correct x2 at conclusion.  EBL: 5 mL  Drains: None  PLAN OF CARE: Discharge to home after PACU  PATIENT DISPOSITION:  PACU - hemodynamically stable.  OR FINDINGS: Normal perianal skin.  Internal hemorrhoids, most significant at the left lateral position.  No external hemorrhoidal component.  No other evident pathology on circumferential anoscopy.  DESCRIPTION: The patient was identified in the preoperative holding area and taken to the OR. SCDs were applied.  He then underwent general endotracheal anesthesia without difficulty. The patient was then rolled onto the OR table in the prone jackknife position. Pressure points were then evaluated and padded. Benzoin was applied to the buttocks and they were gently taped apart.  He was then prepped and draped in usual sterile fashion.  A surgical timeout was performed indicating the correct patient, procedure, and positioning.  A perianal block was then created using a dilute mixture of 0.25% Marcaine with epinephrine and Exparel.  After ascertaining an appropriate level of anesthesia had been achieved, a well lubricated digital rectal exam was performed. This demonstrated no palpable abnormalities.  A Hill-Ferguson anoscope was into the anal canal and circumferential inspection demonstrated healthy appearing anoderm.  Externally, there is no significant  hemorrhoidal tissue.  Within the internal hemorrhoidal columns, there is some enlargement in the left lateral position.  There is no ulceration.  There is no other significant internal hemorrhoidal component.    Attention was directed to hemorrhoidectomy of the left lateral hemorrhoidal tissue.  The area is elevated with a DeBakey forcep.  The anoderm is incised sharply.  The internal hemorrhoidal tissues dissected free of the underlying internal sphincter muscle.  Subsequently, hemorrhoidectomy was carried out using a hand-held/small gel LigaSure device.  This was done for hemostasis purposes given his habitus and potential for bleeding.  The left lateral hemorrhoidal tissue was fully excised and passed off as specimen.  The defect is inspected.  Hemostasis is achieved electrocautery.  The hemorrhoidal defect was then closed using interrupted 2-0 Vicryl sutures.  The area was irrigated.  Hemostasis is verified.  Circumferential anoscopy demonstrates no other significant hemorrhoidal tissue or other evident pathology within the anal canal or distal rectum.  All sponge, needle, and counts were reported correct.  Additional local anesthetic was infiltrated around the hemorrhoidectomy site.  A piece of Surgifoam was placed in the into the anal canal.  The buttocks are untaped.  A dressing consisting of 4 x 4's and ABD was placed.  He was rolled back onto a stretcher, awakened anesthesia, extubated, and transported to the recovery room in satisfactory condition.  DISPOSITION: PACU in satisfactory condition.

## 2023-09-19 NOTE — Transfer of Care (Signed)
 Immediate Anesthesia Transfer of Care Note  Patient: Christopher Riley  Procedure(s) Performed: Jonni Sanger UNDER ANESTHESIA  Patient Location: PACU  Anesthesia Type:General  Level of Consciousness: awake, alert , and oriented  Airway & Oxygen Therapy: Patient Spontanous Breathing and Patient connected to face mask oxygen  Post-op Assessment: Report given to RN, Post -op Vital signs reviewed and stable, and Patient moving all extremities X 4  Post vital signs: Reviewed and stable  Last Vitals:  Vitals Value Taken Time  BP 203/82 09/19/23 1330  Temp    Pulse 95 09/19/23 1330  Resp 19 09/19/23 1330  SpO2 100 % 09/19/23 1330  Vitals shown include unfiled device data.  Last Pain:  Vitals:   09/19/23 1140  TempSrc:   PainSc: 7       Patients Stated Pain Goal: 5 (09/19/23 1140)  Complications: No notable events documented.

## 2023-09-19 NOTE — Anesthesia Procedure Notes (Signed)
 Procedure Name: Intubation Date/Time: 09/19/2023 12:26 PM  Performed by: Nelle Don, CRNAPre-anesthesia Checklist: Patient identified, Emergency Drugs available, Suction available and Patient being monitored Patient Re-evaluated:Patient Re-evaluated prior to induction Oxygen Delivery Method: Circle system utilized Preoxygenation: Pre-oxygenation with 100% oxygen Induction Type: IV induction Ventilation: Mask ventilation without difficulty Laryngoscope Size: Glidescope and 4 Grade View: Grade I Tube type: Oral Tube size: 7.5 mm Number of attempts: 1 Airway Equipment and Method: Stylet and Video-laryngoscopy Placement Confirmation: ETT inserted through vocal cords under direct vision, positive ETCO2 and breath sounds checked- equal and bilateral Secured at: 23 cm Tube secured with: Tape Dental Injury: Teeth and Oropharynx as per pre-operative assessment  Comments: Elective glidescope

## 2023-09-19 NOTE — Discharge Instructions (Addendum)
 ANORECTAL SURGERY: POST OP INSTRUCTIONS  DIET: Follow a light bland diet the first 24 hours after arrival home, such as soup, liquids, crackers, etc.  Be sure to include lots of fluids daily.  Avoid fast food or heavy meals as your are more likely to get nauseated.  Eat a low fat diet the next few days after surgery.   Some bleeding with bowel movements is expected for the first couple of days but this should stop in between bowel movements  Take your usually prescribed home medications unless otherwise directed. No foreign bodies per rectum for the next 3 months (enemas, etc) A piece of foam gauze was intentionally placed into the anal canal/rectum at conclusion of your surgery. If you notice a bloody piece of foam/gel that passes with your BM this is normal and is ok to flush.  PAIN CONTROL: It is helpful to take an over-the-counter pain medication regularly for the first few days/weeks.  Choose from the following that works best for you: Ibuprofen (Advil, etc) Three 200mg  tabs every 6 hours as needed. Acetaminophen (Tylenol, etc) 500-650mg  every 6 hours as needed NOTE: You may take both of these medications together - most patients find it most helpful when alternating between the two (i.e. Ibuprofen at 6am, tylenol at 9am, ibuprofen at 12pm ..Marland Kitchen) A  prescription for pain medication may have been prescribed for you at discharge.  Take your pain medication as prescribed.  If you are having problems/concerns with the prescription medicine, please call us for further advice.  Avoid getting constipated.  Between the surgery and the pain medications, it is common to experience some constipation.  Increasing fluid intake (64oz of water per day) and taking a fiber supplement (such as Metamucil, Citrucel, FiberCon) 1-2 times a day regularly will usually help prevent this problem from occurring.  Take Miralax (over the counter) 1-2x/day while taking a narcotic pain medication. If no bowel movement after  48hours, you may additionally take a laxative like a bottle of Milk of Magnesia which can be purchased over the counter. Avoid enemas.   Watch out for diarrhea.  If you have many loose bowel movements, simplify your diet to bland foods.  Stop any stool softeners and decrease your fiber supplement. If this worsens or does not improve, please call us.  Wash / shower every day.  If you were discharged with a dressing, you may remove this the day after your surgery. You may shower normally, getting soap/water on your wound, particularly after bowel movements.  Soaking in a warm bath filled a couple inches ("Sitz bath") is a great way to clean the area after a bowel movement and many patients find it is a way to soothe the area.  ACTIVITIES as tolerated:   You may resume regular (light) daily activities beginning the next day--such as daily self-care, walking, climbing stairs--gradually increasing activities as tolerated.  If you can walk 30 minutes without difficulty, it is safe to try more intense activity such as jogging, treadmill, bicycling, low-impact aerobics, etc. Refrain from any heavy lifting or straining for the first 2 weeks after your procedure, particularly if your surgery was for hemorrhoids. Avoid activities that make your pain worse You may drive when you are no longer taking prescription pain medication, you can comfortably wear a seatbelt, and you can safely maneuver your car and apply brakes.  FOLLOW UP in our office Please call [facility] at [phone] to set up an appointment to see your surgeon in the office for  a follow-up appointment approximately 2 weeks after your surgery. Make sure that you call for this appointment the day you arrive home to insure a convenient appointment time.  9. If you have disability or family leave forms that need to be completed, you may have them completed by your primary care physician's office; for return to work instructions, please ask our office  staff and they will be happy to assist you in obtaining this documentation   When to call us [phone]: Poor pain control Reactions / problems with new medications (rash/itching, etc)  Fever over 101.5 F (38.5 C) Inability to urinate Nausea/vomiting Worsening swelling or bruising Continued bleeding from incision. Increased pain, redness, or drainage from the incision  The clinic staff is available to answer your questions during regular business hours (8:30am-5pm).  Please don't hesitate to call and ask to speak to one of our nurses for clinical concerns.   A surgeon from [facility] is always on call at the hospitals   If you have a medical emergency, go to the nearest emergency room or call 911.   [facility] [address], [address], [place], [place]  [address] MAIN: [phone] FAX: [phone] [email or web address]

## 2023-09-19 NOTE — Anesthesia Postprocedure Evaluation (Signed)
 Anesthesia Post Note  Patient: Christopher Riley  Procedure(s) Performed: Jonni Sanger UNDER ANESTHESIA     Patient location during evaluation: PACU Anesthesia Type: General Level of consciousness: awake and alert and oriented Pain management: pain level controlled Vital Signs Assessment: post-procedure vital signs reviewed and stable Respiratory status: spontaneous breathing, nonlabored ventilation and respiratory function stable Cardiovascular status: blood pressure returned to baseline and stable Postop Assessment: no apparent nausea or vomiting Anesthetic complications: no   No notable events documented.  Last Vitals:  Vitals:   09/19/23 1415 09/19/23 1430  BP: (!) 162/81 (!) 162/81  Pulse: 92 86  Resp: 16 16  Temp:  (!) 36.4 C  SpO2: (!) 86% 99%    Last Pain:  Vitals:   09/19/23 1430  TempSrc:   PainSc: 3                  Houda Brau A.

## 2023-09-20 ENCOUNTER — Encounter (HOSPITAL_COMMUNITY): Payer: Self-pay | Admitting: Surgery

## 2023-09-20 ENCOUNTER — Other Ambulatory Visit (HOSPITAL_COMMUNITY): Payer: Self-pay

## 2023-09-20 LAB — SURGICAL PATHOLOGY

## 2023-09-30 ENCOUNTER — Other Ambulatory Visit (HOSPITAL_COMMUNITY): Payer: Self-pay

## 2024-05-01 ENCOUNTER — Other Ambulatory Visit: Payer: Self-pay

## 2024-05-01 DIAGNOSIS — R1900 Intra-abdominal and pelvic swelling, mass and lump, unspecified site: Secondary | ICD-10-CM

## 2024-05-27 ENCOUNTER — Ambulatory Visit (HOSPITAL_COMMUNITY)
Admission: EM | Admit: 2024-05-27 | Discharge: 2024-05-27 | Disposition: A | Discharge status: Home / Self Care | Attending: Emergency Medicine | Admitting: Emergency Medicine

## 2024-05-27 ENCOUNTER — Encounter (HOSPITAL_COMMUNITY): Payer: Self-pay

## 2024-05-27 DIAGNOSIS — H7392 Unspecified disorder of tympanic membrane, left ear: Secondary | ICD-10-CM

## 2024-05-27 DIAGNOSIS — H66002 Acute suppurative otitis media without spontaneous rupture of ear drum, left ear: Secondary | ICD-10-CM

## 2024-05-27 MED ORDER — AMOXICILLIN-POT CLAVULANATE 875-125 MG PO TABS: 1.0000 | ORAL_TABLET | Freq: Two times a day (BID) | ORAL | 0 refills | Status: AC

## 2024-05-27 NOTE — ED Triage Notes (Signed)
 Patient reports that he can hear his heart beating and then he says, It's a funny noise in my left ear. Patient states it started yesterday.
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
## 2024-05-27 NOTE — Discharge Instructions (Signed)
 The unusual sound that you have been hearing in your left ear is due to a small hair that is laying across your eardrum.  Hopefully, irrigating your left ear canal has resolved this issue for you.  If it has not completely resolved the issue, please note that the hair will eventually come out of your ear ear on its own.  Examination of your left eardrum also reveals an infection of your middle ear.  I believe that this is why you are hearing her heartbeat in your left ear due to the pressure caused by infection.  You will need an antibiotic for treatment of this.  I sent a prescription for Augmentin to your pharmacy.  Please take 1 tablet twice daily for the next 7 days.  If you have not had complete resolution of either issue after 7 days of antibiotics, please consider following up with your primary care provider or with an ear nose and throat specialist.  Thank you for visiting [facility] today.  We appreciate the opportunity to participate in your care.

## 2024-06-06 ENCOUNTER — Other Ambulatory Visit (HOSPITAL_COMMUNITY): Payer: Self-pay

## 2024-06-06 ENCOUNTER — Ambulatory Visit (HOSPITAL_COMMUNITY)
Admission: RE | Admit: 2024-06-06 | Discharge: 2024-06-06 | Disposition: A | Discharge status: Home / Self Care | Source: Ambulatory Visit | Attending: Surgery | Admitting: Surgery

## 2024-06-06 DIAGNOSIS — R600 Localized edema: Secondary | ICD-10-CM

## 2024-07-21 ENCOUNTER — Other Ambulatory Visit: Payer: Self-pay

## 2024-07-21 ENCOUNTER — Emergency Department (HOSPITAL_COMMUNITY)

## 2024-07-21 ENCOUNTER — Emergency Department (HOSPITAL_COMMUNITY)
Admission: EM | Admit: 2024-07-21 | Discharge: 2024-07-21 | Disposition: A | Discharge status: Home / Self Care | Attending: Emergency Medicine | Admitting: Emergency Medicine

## 2024-07-21 ENCOUNTER — Encounter (HOSPITAL_COMMUNITY): Payer: Self-pay

## 2024-07-21 DIAGNOSIS — R600 Localized edema: Secondary | ICD-10-CM | POA: Insufficient documentation

## 2024-07-21 DIAGNOSIS — I509 Heart failure, unspecified: Secondary | ICD-10-CM | POA: Insufficient documentation

## 2024-07-21 DIAGNOSIS — E119 Type 2 diabetes mellitus without complications: Secondary | ICD-10-CM | POA: Insufficient documentation

## 2024-07-21 DIAGNOSIS — I110 Hypertensive heart disease with heart failure: Secondary | ICD-10-CM | POA: Diagnosis not present

## 2024-07-21 DIAGNOSIS — Z7984 Long term (current) use of oral hypoglycemic drugs: Secondary | ICD-10-CM | POA: Insufficient documentation

## 2024-07-21 DIAGNOSIS — R0602 Shortness of breath: Secondary | ICD-10-CM | POA: Diagnosis present

## 2024-07-21 LAB — BASIC METABOLIC PANEL WITH GFR
Anion gap: 8 (ref 5–15)
BUN: 15 mg/dL (ref 8–23)
CO2: 32 mmol/L (ref 22–32)
Calcium: 9.3 mg/dL (ref 8.9–10.3)
Chloride: 102 mmol/L (ref 98–111)
Creatinine, Ser: 0.87 mg/dL (ref 0.61–1.24)
GFR, Estimated: >60 mL/min
Glucose, Bld: 113 mg/dL — ABNORMAL HIGH (ref 70–99)
Potassium: 4.3 mmol/L (ref 3.5–5.1)
Sodium: 142 mmol/L (ref 135–145)

## 2024-07-21 LAB — CBC
HCT: 42 % (ref 39.0–52.0)
Hemoglobin: 13 g/dL (ref 13.0–17.0)
MCH: 26.7 pg (ref 26.0–34.0)
MCHC: 31 g/dL (ref 30.0–36.0)
MCV: 86.2 fL (ref 80.0–100.0)
Platelets: 286 K/uL (ref 150–400)
RBC: 4.87 MIL/uL (ref 4.22–5.81)
RDW: 14.6 % (ref 11.5–15.5)
WBC: 8.8 K/uL (ref 4.0–10.5)
nRBC: 0 % (ref 0.0–0.2)

## 2024-07-21 LAB — PRO BRAIN NATRIURETIC PEPTIDE: Pro Brain Natriuretic Peptide: 351 pg/mL — ABNORMAL HIGH

## 2024-07-21 MED ORDER — OXYCODONE-ACETAMINOPHEN 5-325 MG PO TABS
1.0000 | ORAL_TABLET | Freq: Once | ORAL | Status: AC
  Administered 2024-07-21: 1 via ORAL
  Filled 2024-07-21: qty 1

## 2024-07-21 MED ORDER — FUROSEMIDE 10 MG/ML IJ SOLN
40.0000 mg | Freq: Once | INTRAMUSCULAR | Status: AC
  Administered 2024-07-21: 40 mg via INTRAVENOUS
  Filled 2024-07-21: qty 4

## 2024-07-21 MED ORDER — MORPHINE SULFATE (PF) 4 MG/ML IV SOLN
4.0000 mg | Freq: Once | INTRAVENOUS | Status: AC
  Administered 2024-07-21: 4 mg via INTRAVENOUS
  Filled 2024-07-21: qty 1

## 2024-07-21 NOTE — Discharge Instructions (Signed)
 Your workup today was notable for some mild increase in your heart failure enzyme, however your oxygen saturation was stable, there was not a large amount of fluid on your lungs.  We gave you an extra dose of your diuretic in the emergency department, I would take a double dose of your home diuretic for the next 3 days, and follow-up closely with your cardiologist and primary care doctor.

## 2024-07-21 NOTE — ED Triage Notes (Signed)
 Pt BIB EMS from home with reports of shob x 1 week. Pt has a hx of chf. Pt has bilateral leg swelling.

## 2024-07-21 NOTE — ED Provider Notes (Signed)
 " [place] EMERGENCY DEPARTMENT AT [facility] Provider Note   CSN: [id number] Arrival date & time: 07/21/24  9984     Patient presents with: Shortness of Breath  Christopher Riley is a 73 y.o. male with past medical history seen for diabetes, hyperlipidemia, hypertension, heart failure, obstructive sleep apnea, obesity presents concern for shortness of breath for 1 week, history of CHF, some bilateral leg swelling.  Shortness of breath with with exertion, denies any chest pain.  He has been taking his home torsemide as directed.    Shortness of Breath      Prior to Admission medications  Medication Sig Start Date End Date Taking? Authorizing Provider  acetaminophen  (TYLENOL ) 325 MG tablet Take 325 mg by mouth 3 (three) times daily as needed for mild pain. 12/05/20   [provider]  empagliflozin  (JARDIANCE ) 25 MG TABS tablet Take 25 mg by mouth daily. 05/31/23   [provider]  omeprazole (PRILOSEC) 20 MG capsule Take 20 mg by mouth daily.    [provider]  psyllium (METAMUCIL) 58.6 % powder Take 1 packet by mouth daily as needed (constipation).    [provider]  spironolactone (ALDACTONE) 25 MG tablet Take 25 mg by mouth daily.    [provider]  torsemide (DEMADEX) 20 MG tablet Take 20 mg by mouth daily.    [provider]    Allergies: Nsaids and Other    Review of Systems  Respiratory:  Positive for shortness of breath.   All other systems reviewed and are negative.   Updated Vital Signs BP (!) 140/66 (BP Location: Left Arm)   Pulse 78   Temp 98.1 F (36.7 C) (Oral)   Resp 17   Ht 6' 2 (1.88 m)   Wt (!) 170.1 kg   SpO2 94%   BMI 48.15 kg/m   Physical Exam Vitals and nursing note reviewed.  Constitutional:      General: He is not in acute distress.    Appearance: Normal appearance.  HENT:     Head: Normocephalic and atraumatic.  Eyes:     General:        Right eye: No discharge.         Left eye: No discharge.  Cardiovascular:     Rate and Rhythm: Normal rate and regular rhythm.     Heart sounds: No murmur heard.    No friction rub. No gallop.  Pulmonary:     Effort: Pulmonary effort is normal.     Breath sounds: Normal breath sounds.     Comments: No wheezing, rhonchi, stridor, rales, question some mild crackles at lung bases Abdominal:     General: Bowel sounds are normal.     Palpations: Abdomen is soft.  Musculoskeletal:     Comments: Some chronic lower extremity edema with venous stasis dermatitis, some mild pitting.  Skin:    General: Skin is warm and dry.     Capillary Refill: Capillary refill takes less than 2 seconds.  Neurological:     Mental Status: He is alert and oriented to person, place, and time.  Psychiatric:        Mood and Affect: Mood normal.        Behavior: Behavior normal.     (all labs ordered are listed, but only abnormal results are displayed) Labs Reviewed  BASIC METABOLIC PANEL WITH GFR - Abnormal; Notable for the following components:      Result Value   Glucose, Bld 113 (*)  All other components within normal limits  PRO BRAIN NATRIURETIC PEPTIDE - Abnormal; Notable for the following components:   Pro Brain Natriuretic Peptide 351.0 (*)    All other components within normal limits  CBC    EKG: None  Radiology: DG Chest 2 View Result Date: 07/21/2024 EXAM: 2 VIEW(S) XRAY OF THE CHEST 07/21/2024 12:43:00 AM COMPARISON: Prominent date chest x ray 36 2014. CLINICAL HISTORY: FINDINGS: LUNGS AND PLEURA: Lungs are clear. No pleural effusion. No pneumothorax. HEART AND MEDIASTINUM: The aorta appears ectatic, a new finding. The heart is mildly enlarged. BONES AND SOFT TISSUES: No acute osseous abnormality. IMPRESSION: 1. No acute findings. 2. Mild cardiomegaly. 3. Ectatic aorta. Electronically signed by: Greig Pique MD 07/21/2024 12:52 AM EST RP Workstation: [id number]     Procedures   Medications Ordered in the ED   furosemide  (LASIX ) injection 40 mg (has no administration in time range)  morphine  (PF) 4 MG/ML injection 4 mg (has no administration in time range)                                    Medical Decision Making Amount and/or Complexity of Data Reviewed Labs: ordered. Radiology: ordered.  Risk Prescription drug management.   This patient is a 73 y.o. male  who presents to the ED for concern of shob, leg swelling.   Differential diagnoses prior to evaluation: The emergent differential diagnosis includes, but is not limited to,  asthma exacerbation, COPD exacerbation, acute upper respiratory infection, acute bronchitis, chronic bronchitis, interstitial lung disease, ARDS, PE, pneumonia, atypical ACS, carbon monoxide poisoning, spontaneous pneumothorax, new CHF vs CHF exacerbation, versus other . This is not an exhaustive differential.   Past Medical History / Co-morbidities / Social History: diabetes, hyperlipidemia, hypertension, heart failure, obstructive sleep apnea, obesity  Physical Exam: Physical exam performed. The pertinent findings include: Some chronic lower extremity edema with venous stasis dermatitis, some mild pitting.   No wheezing, rhonchi, stridor, rales, question some mild crackles at lung bases  Lab Tests/Imaging studies: I personally interpreted labs/imaging and the pertinent results include: CBC unremarkable, BMP unremarkable, BNP is mildly elevated at 351.  I independently interpreted plain film chest x-ray which shows stable cardiomegaly, no evidence of significant vascular congestion, pleural effusion. I agree with the radiologist interpretation.  Cardiac monitoring: EKG obtained and interpreted by myself and attending physician which shows: NSR, no significant change from baseline   Medications: I ordered medication including Lasix , morphine  for pain, mild heart failure exacerbation.  I have reviewed the patients home medicines and have made adjustments as  needed.   Disposition: After consideration of the diagnostic results and the patients response to treatment, I feel that patient with mild CHF exacerbation stable for discharge with plan as above, encouraged increased home torsemide for the next 3 days, close PCP / cardiology follow up .   emergency department workup does not suggest an emergent condition requiring admission or immediate intervention beyond what has been performed at this time. The plan is: as above. The patient is safe for discharge and has been instructed to return immediately for worsening symptoms, change in symptoms or any other concerns.   Final diagnoses:  None    ED Discharge Orders     None          Rosan Sherlean VEAR DEVONNA 08/01/24 1714    Bari Charmaine FALCON, MD 08/06/24 2303  "

## 2024-07-28 ENCOUNTER — Emergency Department (HOSPITAL_COMMUNITY)

## 2024-07-28 ENCOUNTER — Encounter (HOSPITAL_COMMUNITY): Payer: Self-pay

## 2024-07-28 ENCOUNTER — Other Ambulatory Visit: Payer: Self-pay

## 2024-07-28 ENCOUNTER — Inpatient Hospital Stay (HOSPITAL_COMMUNITY)
Admission: EM | Admit: 2024-07-28 | Discharge: 2024-08-01 | DRG: 291 | Disposition: A | Discharge status: Home / Self Care | Attending: Student | Admitting: Student

## 2024-07-28 DIAGNOSIS — I110 Hypertensive heart disease with heart failure: Principal | ICD-10-CM | POA: Diagnosis present

## 2024-07-28 DIAGNOSIS — Z833 Family history of diabetes mellitus: Secondary | ICD-10-CM

## 2024-07-28 DIAGNOSIS — Z7984 Long term (current) use of oral hypoglycemic drugs: Secondary | ICD-10-CM

## 2024-07-28 DIAGNOSIS — R0902 Hypoxemia: Secondary | ICD-10-CM | POA: Diagnosis present

## 2024-07-28 DIAGNOSIS — I5021 Acute systolic (congestive) heart failure: Secondary | ICD-10-CM | POA: Insufficient documentation

## 2024-07-28 DIAGNOSIS — E1142 Type 2 diabetes mellitus with diabetic polyneuropathy: Secondary | ICD-10-CM | POA: Diagnosis present

## 2024-07-28 DIAGNOSIS — H5461 Unqualified visual loss, right eye, normal vision left eye: Secondary | ICD-10-CM | POA: Diagnosis present

## 2024-07-28 DIAGNOSIS — R0602 Shortness of breath: Principal | ICD-10-CM

## 2024-07-28 DIAGNOSIS — H409 Unspecified glaucoma: Secondary | ICD-10-CM | POA: Diagnosis present

## 2024-07-28 DIAGNOSIS — Z973 Presence of spectacles and contact lenses: Secondary | ICD-10-CM

## 2024-07-28 DIAGNOSIS — F4310 Post-traumatic stress disorder, unspecified: Secondary | ICD-10-CM | POA: Diagnosis present

## 2024-07-28 DIAGNOSIS — I5023 Acute on chronic systolic (congestive) heart failure: Secondary | ICD-10-CM | POA: Diagnosis present

## 2024-07-28 DIAGNOSIS — N400 Enlarged prostate without lower urinary tract symptoms: Secondary | ICD-10-CM | POA: Diagnosis present

## 2024-07-28 DIAGNOSIS — Z91199 Patient's noncompliance with other medical treatment and regimen due to unspecified reason: Secondary | ICD-10-CM

## 2024-07-28 DIAGNOSIS — Z79899 Other long term (current) drug therapy: Secondary | ICD-10-CM

## 2024-07-28 DIAGNOSIS — Z860101 Personal history of adenomatous and serrated colon polyps: Secondary | ICD-10-CM

## 2024-07-28 DIAGNOSIS — E785 Hyperlipidemia, unspecified: Secondary | ICD-10-CM | POA: Diagnosis present

## 2024-07-28 DIAGNOSIS — E119 Type 2 diabetes mellitus without complications: Secondary | ICD-10-CM

## 2024-07-28 DIAGNOSIS — Z886 Allergy status to analgesic agent status: Secondary | ICD-10-CM

## 2024-07-28 DIAGNOSIS — Z9849 Cataract extraction status, unspecified eye: Secondary | ICD-10-CM

## 2024-07-28 DIAGNOSIS — C61 Malignant neoplasm of prostate: Secondary | ICD-10-CM | POA: Diagnosis present

## 2024-07-28 DIAGNOSIS — G8929 Other chronic pain: Secondary | ICD-10-CM | POA: Diagnosis present

## 2024-07-28 DIAGNOSIS — H9190 Unspecified hearing loss, unspecified ear: Secondary | ICD-10-CM | POA: Diagnosis present

## 2024-07-28 DIAGNOSIS — Z96651 Presence of right artificial knee joint: Secondary | ICD-10-CM | POA: Diagnosis present

## 2024-07-28 DIAGNOSIS — Z6841 Body Mass Index (BMI) 40.0 and over, adult: Secondary | ICD-10-CM

## 2024-07-28 DIAGNOSIS — I480 Paroxysmal atrial fibrillation: Secondary | ICD-10-CM | POA: Diagnosis present

## 2024-07-28 DIAGNOSIS — I872 Venous insufficiency (chronic) (peripheral): Secondary | ICD-10-CM | POA: Diagnosis present

## 2024-07-28 DIAGNOSIS — Z8546 Personal history of malignant neoplasm of prostate: Secondary | ICD-10-CM

## 2024-07-28 DIAGNOSIS — D649 Anemia, unspecified: Secondary | ICD-10-CM | POA: Diagnosis present

## 2024-07-28 DIAGNOSIS — E662 Morbid (severe) obesity with alveolar hypoventilation: Secondary | ICD-10-CM | POA: Diagnosis present

## 2024-07-28 DIAGNOSIS — I10 Essential (primary) hypertension: Secondary | ICD-10-CM | POA: Diagnosis present

## 2024-07-28 DIAGNOSIS — F32A Depression, unspecified: Secondary | ICD-10-CM | POA: Diagnosis present

## 2024-07-28 DIAGNOSIS — Z8711 Personal history of peptic ulcer disease: Secondary | ICD-10-CM

## 2024-07-28 DIAGNOSIS — K219 Gastro-esophageal reflux disease without esophagitis: Secondary | ICD-10-CM | POA: Diagnosis present

## 2024-07-28 DIAGNOSIS — Z888 Allergy status to other drugs, medicaments and biological substances status: Secondary | ICD-10-CM

## 2024-07-28 LAB — CBC
HCT: 42.7 % (ref 39.0–52.0)
Hemoglobin: 12.9 g/dL — ABNORMAL LOW (ref 13.0–17.0)
MCH: 26.4 pg (ref 26.0–34.0)
MCHC: 30.2 g/dL (ref 30.0–36.0)
MCV: 87.5 fL (ref 80.0–100.0)
Platelets: 300 K/uL (ref 150–400)
RBC: 4.88 MIL/uL (ref 4.22–5.81)
RDW: 14.6 % (ref 11.5–15.5)
WBC: 8.5 K/uL (ref 4.0–10.5)
nRBC: 0 % (ref 0.0–0.2)

## 2024-07-28 LAB — BASIC METABOLIC PANEL WITH GFR
Anion gap: 10 (ref 5–15)
BUN: 15 mg/dL (ref 8–23)
CO2: 27 mmol/L (ref 22–32)
Calcium: 9.1 mg/dL (ref 8.9–10.3)
Chloride: 104 mmol/L (ref 98–111)
Creatinine, Ser: 0.83 mg/dL (ref 0.61–1.24)
GFR, Estimated: >60 mL/min
Glucose, Bld: 106 mg/dL — ABNORMAL HIGH (ref 70–99)
Potassium: 3.9 mmol/L (ref 3.5–5.1)
Sodium: 142 mmol/L (ref 135–145)

## 2024-07-28 LAB — D-DIMER, QUANTITATIVE: D-Dimer, Quant: 1.26 ug{FEU}/mL — ABNORMAL HIGH (ref 0.00–0.50)

## 2024-07-28 LAB — PRO BRAIN NATRIURETIC PEPTIDE: Pro Brain Natriuretic Peptide: 180 pg/mL

## 2024-07-28 MED ORDER — ACETAMINOPHEN 500 MG PO TABS
1000.0000 mg | ORAL_TABLET | Freq: Once | ORAL | Status: AC
  Administered 2024-07-28: 1000 mg via ORAL
  Filled 2024-07-28: qty 2

## 2024-07-28 MED ORDER — OXYCODONE HCL 5 MG PO TABS
5.0000 mg | ORAL_TABLET | Freq: Once | ORAL | Status: AC
  Administered 2024-07-28: 5 mg via ORAL
  Filled 2024-07-28: qty 1

## 2024-07-28 MED ORDER — GABAPENTIN 100 MG PO CAPS
100.0000 mg | ORAL_CAPSULE | Freq: Once | ORAL | Status: AC
  Administered 2024-07-28: 100 mg via ORAL
  Filled 2024-07-28: qty 1

## 2024-07-28 MED ORDER — IOHEXOL 350 MG/ML SOLN
75.0000 mL | Freq: Once | INTRAVENOUS | Status: AC | PRN
  Administered 2024-07-28: 75 mL via INTRAVENOUS

## 2024-07-28 NOTE — ED Provider Notes (Signed)
 " [place] EMERGENCY DEPARTMENT AT [facility] Provider Note   CSN: [id number] Arrival date & time: 07/28/24  1457     Patient presents with: Shortness of Breath   Christopher Riley is a 74 y.o. male.  {Add pertinent medical, surgical, social history, OB history to HPI:6761} 74 year old male with a history of HTN, HLD, DM, and OSA presents to the emergency department for shortness of breath.  Reports shortness of breath being fairly constant, but it is worsened with movement as well as exertion.  He ambulates with a walker at baseline.  Denies episodes of chest pain associated with his shortness of breath.  Further denies diaphoresis, nausea, syncope/near syncope, hemoptysis, fever.  He has chronic lower extremity swelling which has been largely unchanged.  He has been compliant with his torsemide.  Subsequently also reporting some burning pain in his bilateral feet which he attributes to his chronic edema, though he does have documented history of diabetic neuropathy.  He has not been taking any medications for this at home.  He has reported hx of CHF; however I am unable to view any prior echocardiogram or evaluations confirming this diagnosis.  This was also notated at his initial visit with cardiology at [facility] in August.  The history is provided by the patient and medical records. No language interpreter was used.  Shortness of Breath      Prior to Admission medications  Medication Sig Start Date End Date Taking? Authorizing Provider  acetaminophen  (TYLENOL ) 325 MG tablet Take 325 mg by mouth 3 (three) times daily as needed for mild pain. 12/05/20   [provider]  empagliflozin  (JARDIANCE ) 25 MG TABS tablet Take 25 mg by mouth daily. 05/31/23   [provider]  omeprazole (PRILOSEC) 20 MG capsule Take 20 mg by mouth daily.    [provider]  psyllium (METAMUCIL) 58.6 % powder Take 1 packet by mouth daily as needed (constipation).    [provider]  spironolactone (ALDACTONE) 25 MG tablet Take 25 mg by mouth daily.    [provider]  torsemide (DEMADEX) 20 MG tablet Take 20 mg by mouth daily.    [provider]    Allergies: Nsaids and Other    Review of Systems  Respiratory:  Positive for shortness of breath.   Ten systems reviewed and are negative for acute change, except as noted in the HPI.    Updated Vital Signs BP (!) 167/73 (BP Location: Right Arm)   Pulse 65   Temp 97.9 F (36.6 C) (Oral)   Resp 15   Ht 6' 2 (1.88 m)   Wt (!) 170.1 kg   SpO2 94%   BMI 48.15 kg/m   Physical Exam Vitals and nursing note reviewed.  Constitutional:      General: He is not in acute distress.    Appearance: He is well-developed. He is not diaphoretic.     Comments: Nontoxic-appearing, pleasant and obese African-American male.  HENT:     Head: Normocephalic and atraumatic.  Eyes:     General: No scleral icterus.    Conjunctiva/sclera: Conjunctivae normal.  Cardiovascular:     Rate and Rhythm: Normal rate and regular rhythm.     Pulses: Normal pulses.  Pulmonary:     Effort: Pulmonary effort is normal. No respiratory distress.     Comments: Moving air fairly well.  No dyspnea or tachypnea noted.  Chest expansion symmetric.  No rales or rhonchi noted. Musculoskeletal:  General: Normal range of motion.     Cervical back: Normal range of motion.     Comments: Significant edema bilateral lower extremities, right slightly worse than left.  Mild pitting noted.  Skin:    General: Skin is warm and dry.     Coloration: Skin is not pale.     Findings: No erythema or rash.  Neurological:     Mental Status: He is alert and oriented to person, place, and time.  Psychiatric:        Behavior: Behavior normal.     (all labs ordered are listed, but only abnormal results are displayed) Labs Reviewed  BASIC METABOLIC PANEL WITH GFR - Abnormal; Notable for the following components:      Result  Value   Glucose, Bld 106 (*)    All other components within normal limits  CBC - Abnormal; Notable for the following components:   Hemoglobin 12.9 (*)    All other components within normal limits  D-DIMER, QUANTITATIVE - Abnormal; Notable for the following components:   D-Dimer, Quant 1.26 (*)    All other components within normal limits  PRO BRAIN NATRIURETIC PEPTIDE    EKG: None  Radiology: DG Chest 2 View Result Date: 07/28/2024 CLINICAL DATA:  Shortness of breath EXAM: CHEST - 2 VIEW COMPARISON:  07/21/2024, 09/28/2012 FINDINGS: Hypoventilatory changes. Cardiomegaly with mild central congestion. Prominent appearing central pulmonary vessels. No consolidation or pneumothorax. Biapical pleural thickening is chronic. IMPRESSION: Cardiomegaly with mild central congestion. Prominent appearing central pulmonary vessels, possible pulmonary arterial hypertension. Electronically Signed   By: Luke Bun M.D.   On: 07/28/2024 16:18    {Document cardiac monitor, telemetry assessment procedure when appropriate:32947} Procedures   Medications Ordered in the ED  oxyCODONE  (Oxy IR/ROXICODONE ) immediate release tablet 5 mg (has no administration in time range)  acetaminophen  (TYLENOL ) tablet 1,000 mg (1,000 mg Oral Given 07/28/24 2301)  gabapentin  (NEURONTIN ) capsule 100 mg (100 mg Oral Given 07/28/24 2301)    Clinical Course as of 07/28/24 2337  Sat Jul 28, 2024  2234 Patient presenting for ongoing chest pain.  He is pleasant, overall well-appearing, but morbidly obese.  Certainly possible that his symptoms may be related to obesity/physical deconditioning.  He does, however, reported history of CHF.  He was presumed to be experiencing a CHF exacerbation a week or so ago contributing to his symptomatic complaint, though his BNP today is improved compared to prior.  He does have some cardiomegaly at baseline which appears stable.  There is mild central congestion and prominent pulmonary vessels on  chest x-ray today suggestive of pulmonary arterial hypertension.  He has a few documented oxygen saturations below 95%.  Will add D-dimer to exclude pulmonary embolus.  No concern for symptomatic anemia; hemoglobin is stable today.  Low suspicion for infectious etiology as patient has no associated fever.  No leukocytosis.  Chest x-ray negative for pneumonia.  Chronicity also makes infectious etiology less likely. [KH]  2336 Dimer returns elevated at 1.26.  Will obtain CTA.  Patient did ambulate with his rolling walker.  Saturations ranged from 93 to 98% with ambulation. [KH]    Clinical Course User Index [KH] Keith Sor, PA-C   {Click here for ABCD2, HEART and other calculators REFRESH Note before signing:1}                              Medical Decision Making Amount and/or Complexity of Data Reviewed Labs: ordered.  Radiology: ordered.  Risk OTC drugs. Prescription drug management.   ***  {Document critical care time when appropriate  Document review of labs and clinical decision tools ie CHADS2VASC2, etc  Document your independent review of radiology images and any outside records  Document your discussion with family members, caretakers and with consultants  Document social determinants of health affecting pt's care  Document your decision making why or why not admission, treatments were needed:32947:::1}   Final diagnoses:  None    ED Discharge Orders     None        "

## 2024-07-28 NOTE — ED Triage Notes (Signed)
 Patient BIB EMS for shobr Patient evaluated a week ago for same H/o CHF  Currently has increased BLE swelling  VS WNL w EMS  Patient says pain in legs and knees is 9/10

## 2024-07-28 NOTE — ED Notes (Signed)
 During walk O2 stayed between 93-98 %. Pt did use walker. MD and RN notified

## 2024-07-29 ENCOUNTER — Inpatient Hospital Stay (HOSPITAL_COMMUNITY)

## 2024-07-29 DIAGNOSIS — F32A Depression, unspecified: Secondary | ICD-10-CM | POA: Diagnosis present

## 2024-07-29 DIAGNOSIS — H409 Unspecified glaucoma: Secondary | ICD-10-CM | POA: Diagnosis present

## 2024-07-29 DIAGNOSIS — I5021 Acute systolic (congestive) heart failure: Secondary | ICD-10-CM

## 2024-07-29 DIAGNOSIS — D649 Anemia, unspecified: Secondary | ICD-10-CM | POA: Diagnosis present

## 2024-07-29 DIAGNOSIS — I509 Heart failure, unspecified: Secondary | ICD-10-CM

## 2024-07-29 DIAGNOSIS — Z7984 Long term (current) use of oral hypoglycemic drugs: Secondary | ICD-10-CM | POA: Diagnosis not present

## 2024-07-29 DIAGNOSIS — I5023 Acute on chronic systolic (congestive) heart failure: Secondary | ICD-10-CM | POA: Diagnosis present

## 2024-07-29 DIAGNOSIS — Z8711 Personal history of peptic ulcer disease: Secondary | ICD-10-CM | POA: Diagnosis not present

## 2024-07-29 DIAGNOSIS — K219 Gastro-esophageal reflux disease without esophagitis: Secondary | ICD-10-CM | POA: Diagnosis present

## 2024-07-29 DIAGNOSIS — I872 Venous insufficiency (chronic) (peripheral): Secondary | ICD-10-CM | POA: Diagnosis present

## 2024-07-29 DIAGNOSIS — H5461 Unqualified visual loss, right eye, normal vision left eye: Secondary | ICD-10-CM | POA: Diagnosis present

## 2024-07-29 DIAGNOSIS — E119 Type 2 diabetes mellitus without complications: Secondary | ICD-10-CM

## 2024-07-29 DIAGNOSIS — H9190 Unspecified hearing loss, unspecified ear: Secondary | ICD-10-CM | POA: Diagnosis present

## 2024-07-29 DIAGNOSIS — E785 Hyperlipidemia, unspecified: Secondary | ICD-10-CM | POA: Diagnosis present

## 2024-07-29 DIAGNOSIS — E662 Morbid (severe) obesity with alveolar hypoventilation: Secondary | ICD-10-CM | POA: Diagnosis present

## 2024-07-29 DIAGNOSIS — Z91199 Patient's noncompliance with other medical treatment and regimen due to unspecified reason: Secondary | ICD-10-CM | POA: Diagnosis not present

## 2024-07-29 DIAGNOSIS — Z6841 Body Mass Index (BMI) 40.0 and over, adult: Secondary | ICD-10-CM | POA: Diagnosis not present

## 2024-07-29 DIAGNOSIS — I10 Essential (primary) hypertension: Secondary | ICD-10-CM | POA: Diagnosis not present

## 2024-07-29 DIAGNOSIS — I110 Hypertensive heart disease with heart failure: Secondary | ICD-10-CM | POA: Diagnosis present

## 2024-07-29 DIAGNOSIS — F4310 Post-traumatic stress disorder, unspecified: Secondary | ICD-10-CM | POA: Diagnosis present

## 2024-07-29 DIAGNOSIS — E1142 Type 2 diabetes mellitus with diabetic polyneuropathy: Secondary | ICD-10-CM | POA: Diagnosis present

## 2024-07-29 DIAGNOSIS — N400 Enlarged prostate without lower urinary tract symptoms: Secondary | ICD-10-CM | POA: Diagnosis present

## 2024-07-29 DIAGNOSIS — I480 Paroxysmal atrial fibrillation: Secondary | ICD-10-CM | POA: Diagnosis present

## 2024-07-29 DIAGNOSIS — Z888 Allergy status to other drugs, medicaments and biological substances status: Secondary | ICD-10-CM | POA: Diagnosis not present

## 2024-07-29 DIAGNOSIS — Z8546 Personal history of malignant neoplasm of prostate: Secondary | ICD-10-CM | POA: Diagnosis not present

## 2024-07-29 DIAGNOSIS — Z860101 Personal history of adenomatous and serrated colon polyps: Secondary | ICD-10-CM | POA: Diagnosis not present

## 2024-07-29 DIAGNOSIS — Z96651 Presence of right artificial knee joint: Secondary | ICD-10-CM | POA: Diagnosis present

## 2024-07-29 DIAGNOSIS — C61 Malignant neoplasm of prostate: Secondary | ICD-10-CM | POA: Diagnosis not present

## 2024-07-29 DIAGNOSIS — R0602 Shortness of breath: Secondary | ICD-10-CM | POA: Diagnosis present

## 2024-07-29 DIAGNOSIS — Z886 Allergy status to analgesic agent status: Secondary | ICD-10-CM | POA: Diagnosis not present

## 2024-07-29 LAB — ECHOCARDIOGRAM COMPLETE
Area-P 1/2: 5.24 cm2
Height: 74 in
S' Lateral: 3 cm
Weight: 6000 [oz_av]

## 2024-07-29 MED ORDER — ALPRAZOLAM 0.5 MG PO TABS: 1.0000 mg | ORAL_TABLET | Freq: Four times a day (QID) | ORAL | Status: DC | PRN

## 2024-07-29 MED ORDER — ROSUVASTATIN CALCIUM 20 MG PO TABS
20.0000 mg | ORAL_TABLET | Freq: Every day | ORAL | Status: DC
  Administered 2024-07-29 – 2024-07-31 (×3): 20 mg via ORAL
  Filled 2024-07-29 (×3): qty 1

## 2024-07-29 MED ORDER — QUETIAPINE FUMARATE 200 MG PO TABS
400.0000 mg | ORAL_TABLET | Freq: Every day | ORAL | Status: DC
  Administered 2024-07-29 – 2024-07-31 (×3): 400 mg via ORAL
  Filled 2024-07-29 (×3): qty 2

## 2024-07-29 MED ORDER — ALBUTEROL SULFATE (2.5 MG/3ML) 0.083% IN NEBU
3.0000 mL | INHALATION_SOLUTION | RESPIRATORY_TRACT | Status: DC | PRN
  Administered 2024-07-30: 3 mL via RESPIRATORY_TRACT
  Filled 2024-07-29: qty 3

## 2024-07-29 MED ORDER — CARVEDILOL 3.125 MG PO TABS
3.1250 mg | ORAL_TABLET | Freq: Two times a day (BID) | ORAL | Status: DC
  Administered 2024-07-29 – 2024-08-01 (×7): 3.125 mg via ORAL
  Filled 2024-07-29 (×7): qty 1

## 2024-07-29 MED ORDER — OXYCODONE HCL 5 MG PO TABS
5.0000 mg | ORAL_TABLET | ORAL | Status: DC | PRN
  Administered 2024-07-29: 5 mg via ORAL
  Filled 2024-07-29: qty 1

## 2024-07-29 MED ORDER — ENOXAPARIN SODIUM 80 MG/0.8ML IJ SOSY
80.0000 mg | PREFILLED_SYRINGE | INTRAMUSCULAR | Status: DC
  Administered 2024-07-29 – 2024-08-01 (×4): 80 mg via SUBCUTANEOUS
  Filled 2024-07-29 (×5): qty 0.8

## 2024-07-29 MED ORDER — ACETAMINOPHEN 650 MG RE SUPP: 650.0000 mg | Freq: Four times a day (QID) | RECTAL | Status: DC | PRN

## 2024-07-29 MED ORDER — EMPAGLIFLOZIN 10 MG PO TABS
10.0000 mg | ORAL_TABLET | Freq: Every day | ORAL | Status: DC
  Administered 2024-07-29 – 2024-08-01 (×4): 10 mg via ORAL
  Filled 2024-07-29 (×4): qty 1

## 2024-07-29 MED ORDER — ONDANSETRON HCL 4 MG PO TABS: 4.0000 mg | ORAL_TABLET | Freq: Four times a day (QID) | ORAL | Status: DC | PRN

## 2024-07-29 MED ORDER — FUROSEMIDE 10 MG/ML IJ SOLN
80.0000 mg | Freq: Once | INTRAMUSCULAR | Status: AC
  Administered 2024-07-29: 80 mg via INTRAVENOUS
  Filled 2024-07-29: qty 8

## 2024-07-29 MED ORDER — LATANOPROST 0.005 % OP SOLN
1.0000 [drp] | Freq: Every day | OPHTHALMIC | Status: DC
  Administered 2024-07-29 – 2024-07-31 (×3): 1 [drp] via OPHTHALMIC
  Filled 2024-07-29: qty 2.5

## 2024-07-29 MED ORDER — LOSARTAN POTASSIUM 25 MG PO TABS
25.0000 mg | ORAL_TABLET | Freq: Every day | ORAL | Status: DC
  Administered 2024-07-29 – 2024-08-01 (×4): 25 mg via ORAL
  Filled 2024-07-29 (×4): qty 1

## 2024-07-29 MED ORDER — FUROSEMIDE 10 MG/ML IJ SOLN
80.0000 mg | Freq: Two times a day (BID) | INTRAMUSCULAR | Status: DC
  Administered 2024-07-29 – 2024-07-30 (×2): 80 mg via INTRAVENOUS
  Filled 2024-07-29 (×2): qty 8

## 2024-07-29 MED ORDER — PERFLUTREN LIPID MICROSPHERE
1.0000 mL | INTRAVENOUS | Status: AC | PRN
  Administered 2024-07-29: 2 mL via INTRAVENOUS

## 2024-07-29 MED ORDER — ACETAMINOPHEN 325 MG PO TABS: 650.0000 mg | ORAL_TABLET | Freq: Four times a day (QID) | ORAL | Status: DC | PRN

## 2024-07-29 MED ORDER — ONDANSETRON HCL 4 MG/2ML IJ SOLN: 4.0000 mg | Freq: Four times a day (QID) | INTRAMUSCULAR | Status: DC | PRN

## 2024-07-29 NOTE — Plan of Care (Incomplete)
  Problem: Education: Goal: Knowledge of General Education information will improve Description: Including pain rating scale, medication(s)/side effects and non-pharmacologic comfort measures Outcome: Progressing   Problem: Health Behavior/Discharge Planning: Goal: Ability to manage health-related needs will improve Outcome: Progressing   Problem: Clinical Measurements: Goal: Ability to maintain clinical measurements within normal limits will improve Outcome: Progressing Goal: Will remain free from infection Outcome: Progressing Goal: Diagnostic test results will improve Outcome: Progressing Goal: Respiratory complications will improve Outcome: Progressing Goal: Cardiovascular complication will be avoided Outcome: Progressing   Problem: Activity: Goal: Risk for activity intolerance will decrease Outcome: Progressing   Problem: Nutrition: Goal: Adequate nutrition will be maintained Outcome: Adequate for Discharge   Problem: Coping: Goal: Level of anxiety will decrease Outcome: Adequate for Discharge   Problem: Elimination: Goal: Will not experience complications related to bowel motility Outcome: Adequate for Discharge Goal: Will not experience complications related to urinary retention Outcome: Adequate for Discharge   Problem: Pain Managment: Goal: General experience of comfort will improve and/or be controlled Outcome: Adequate for Discharge   Problem: Safety: Goal: Ability to remain free from injury will improve Outcome: Adequate for Discharge   Problem: Skin Integrity: Goal: Risk for impaired skin integrity will decrease Outcome: Adequate for Discharge

## 2024-07-29 NOTE — ED Notes (Signed)
 Nurse placed pt on 2 liters nasal cannula due to pt's oxygen saturation dropping while she was in the room.

## 2024-07-29 NOTE — Progress Notes (Signed)
 [facility] PROGRESS NOTE    Progress Note  Christopher Riley  FMW:[id number] DOB: 08-09-1950 DOA: 07/28/2024 PCP: Claude Reagin, NP     Brief Narrative:   Christopher Riley is an 74 y.o. male past medical history significant for HFrEF unknown EF, essential hypertension, obstructive sleep apnea who does not follow-up much with his cardiology or PCP comes in for shortness of breath and lower extremity swelling, he relates he is taking his diuretic torsemide and Aldactone with no improvement.  CT angio of the chest showed no evidence of PE, BNP was 350.  Going back to the chart in February 2025 he weighed about 155 kg, this last visit to the ED he was weighing 170 kg    Assessment/Plan:   Shortness of breath and lower extremity edema: He has a lot of work to do here. Agree with 2D echo. Start him back on his Coreg , continue Lasix  IV twice daily. I am concerned about pulmonary hypertension Monitor strict I's and O's and daily weights. PT OT consulted. He is on Seroquel  at home I see no documentation of psychiatric disorder he cannot tell me why he takes Seroquel .  Question if it is for PTSD but there is no documentation older notes  Essential hypertension: Continue Coreg  Lasix  start him on an ACE inhibitor.  Hyperlipidemia Continue statins.  Type 2 diabetes mellitus without complications (HCC) Last hemoglobin A1c was 6.5 at home he is on [facility] and Jardiance . Continue Jardiance  and sliding scale.  Obstructive sleep apnea/obesity hypoventilation syndrome: Noncompliant with the CPAP at home.  History of paroxysmal atrial fibrillation: He follows up at the [facility] he has declined DOAC in the past. Rate controlled on Coreg  continue.  Twelve-lead EKG showed sinus rhythm left axis deviation nonspecific T wave changes  Severe morbid obesity (HCC): Noted  Normocytic anemia: Noted.  History of prostate cancer : he cannot provide me any more details I see only documentation from  2022, he is not on chemotherapy.    DVT prophylaxis: lovenox  Family Communication:none Status is: Inpatient Remains inpatient appropriate because: Shortness of breath and lower extremity edema    Code Status:     Code Status Orders  (From admission, onward)           Start     Ordered   07/29/24 0542  Full code  Continuous       Question:  By:  Answer:  Consent: discussion documented in EHR   07/29/24 0541           Code Status History     This patient has a current code status but no historical code status.         IV Access:   Peripheral IV   Procedures and diagnostic studies:   CT Angio Chest PE W and/or Wo Contrast Result Date: 07/29/2024 EXAM: CTA CHEST 07/29/2024 12:26:08 AM TECHNIQUE: CTA of the chest was performed without and with the administration of 75 mL of intravenous iohexol  (OMNIPAQUE ) 350 MG/ML injection. Multiplanar reformatted images are provided for review. MIP images are provided for review. Automated exposure control, iterative reconstruction, and/or weight based adjustment of the mA/kV was utilized to reduce the radiation dose to as low as reasonably achievable. COMPARISON: None available. CLINICAL HISTORY: Pulmonary embolism (PE) suspected, low to intermediate prob, positive D-dimer. FINDINGS: PULMONARY ARTERIES: Pulmonary arteries are adequately opacified for evaluation. No acute pulmonary embolus. Main pulmonary artery is normal in caliber. MEDIASTINUM: The heart and pericardium demonstrate no acute abnormality. There is no acute  abnormality of the thoracic aorta. LYMPH NODES: No mediastinal, hilar or axillary lymphadenopathy. LUNGS AND PLEURA: Dependent ground glass opacities, likely dependent atelectasis. No confluent opacities. No effusions. No pneumothorax. UPPER ABDOMEN: Limited images of the upper abdomen demonstrate a moderate-sized hiatal hernia. SOFT TISSUES AND BONES: No acute bone or soft tissue abnormality. IMPRESSION: 1. No  pulmonary embolism. 2. Moderate-sized hiatal hernia. 3. No acute cardiopulmonary disease. Electronically signed by: Franky Crease MD 07/29/2024 12:29 AM EST RP Workstation: [id number]   DG Chest 2 View Result Date: 07/28/2024 CLINICAL DATA:  Shortness of breath EXAM: CHEST - 2 VIEW COMPARISON:  07/21/2024, 09/28/2012 FINDINGS: Hypoventilatory changes. Cardiomegaly with mild central congestion. Prominent appearing central pulmonary vessels. No consolidation or pneumothorax. Biapical pleural thickening is chronic. IMPRESSION: Cardiomegaly with mild central congestion. Prominent appearing central pulmonary vessels, possible pulmonary arterial hypertension. Electronically Signed   By: Luke Bun M.D.   On: 07/28/2024 16:18     Medical Consultants:   None.   Subjective:    Christopher Riley continues to be short of breath  Objective:    Vitals:   07/29/24 0345 07/29/24 0400 07/29/24 0512 07/29/24 0646  BP: (!) 190/84 (!) 190/93 131/71   Pulse: (!) 57 60 68   Resp:  18 20   Temp:    98 F (36.7 C)  TempSrc:      SpO2:  92% 97%   Weight:      Height:       SpO2: 97 %  No intake or output data in the 24 hours ending 07/29/24 0656 Filed Weights   07/28/24 1508  Weight: (!) 170.1 kg    Exam: General exam: In no acute distress.  Morbidly obese Respiratory system: Good air movement and clear to auscultation. Cardiovascular system: S1 & S2 heard, RRR. No JVD. Gastrointestinal system: Abdomen is nondistended, soft and nontender.  Extremities: 3+ edema dry scaly skin Skin: No rashes, lesions or ulcers Psychiatry: Judgement and insight appear normal. Mood & affect appropriate.    Data Reviewed:    Labs: Basic Metabolic Panel: Recent Labs  Lab 07/28/24 1525  NA 142  K 3.9  CL 104  CO2 27  GLUCOSE 106*  BUN 15  CREATININE 0.83  CALCIUM  9.1   GFR Estimated Creatinine Clearance: 127.7 mL/min (by C-G formula based on SCr of 0.83 mg/dL). Liver Function Tests: No results  for input(s): AST, ALT, ALKPHOS, BILITOT, PROT, ALBUMIN in the last 168 hours. No results for input(s): LIPASE, AMYLASE in the last 168 hours. No results for input(s): AMMONIA in the last 168 hours. Coagulation profile No results for input(s): INR, PROTIME in the last 168 hours. COVID-19 Labs  Recent Labs    07/28/24 2315  DDIMER 1.26*    No results found for: SARSCOV2NAA  CBC: Recent Labs  Lab 07/28/24 1525  WBC 8.5  HGB 12.9*  HCT 42.7  MCV 87.5  PLT 300   Cardiac Enzymes: No results for input(s): CKTOTAL, CKMB, CKMBINDEX, TROPONINI in the last 168 hours. BNP (last 3 results) Recent Labs    07/21/24 0039 07/28/24 1525  PROBNP 351.0* 180.0   CBG: No results for input(s): GLUCAP in the last 168 hours. D-Dimer: Recent Labs    07/28/24 2315  DDIMER 1.26*   Hgb A1c: No results for input(s): HGBA1C in the last 72 hours. Lipid Profile: No results for input(s): CHOL, HDL, LDLCALC, TRIG, CHOLHDL, LDLDIRECT in the last 72 hours. Thyroid function studies: No results for input(s): TSH, T4TOTAL, T3FREE, THYROIDAB in the  last 72 hours.  Invalid input(s): FREET3 Anemia work up: No results for input(s): VITAMINB12, FOLATE, FERRITIN, TIBC, IRON, RETICCTPCT in the last 72 hours. Sepsis Labs: Recent Labs  Lab 07/28/24 1525  WBC 8.5   Microbiology No results found for this or any previous visit (from the past 240 hours).   Medications:    enoxaparin  (LOVENOX ) injection  80 mg Subcutaneous Q24H   Continuous Infusions:    LOS: 0 days   Erle Odell Castor  [facility]  07/29/2024, 6:56 AM

## 2024-07-29 NOTE — Progress Notes (Signed)
" °  Echocardiogram 2D Echocardiogram has been performed.  Tinnie FORBES Gosling RDCS 07/29/2024, 11:40 AM "

## 2024-07-29 NOTE — ED Notes (Signed)
 Pt advised he feels like it is difficult for him to breathe, however pt is speaking in complete sentences without any difficulty. No obvious respiratory distress noted. Pt breathing normal with equal rise and fall of the chest.
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
## 2024-07-30 LAB — CBC
HCT: 38.4 % — ABNORMAL LOW (ref 39.0–52.0)
Hemoglobin: 11.8 g/dL — ABNORMAL LOW (ref 13.0–17.0)
MCH: 26 pg (ref 26.0–34.0)
MCHC: 30.7 g/dL (ref 30.0–36.0)
MCV: 84.6 fL (ref 80.0–100.0)
Platelets: 298 K/uL (ref 150–400)
RBC: 4.54 MIL/uL (ref 4.22–5.81)
RDW: 14.6 % (ref 11.5–15.5)
WBC: 8.3 K/uL (ref 4.0–10.5)
nRBC: 0 % (ref 0.0–0.2)

## 2024-07-30 LAB — BASIC METABOLIC PANEL WITH GFR
Anion gap: 9 (ref 5–15)
BUN: 16 mg/dL (ref 8–23)
CO2: 30 mmol/L (ref 22–32)
Calcium: 8.7 mg/dL — ABNORMAL LOW (ref 8.9–10.3)
Chloride: 102 mmol/L (ref 98–111)
Creatinine, Ser: 1.01 mg/dL (ref 0.61–1.24)
GFR, Estimated: >60 mL/min
Glucose, Bld: 92 mg/dL (ref 70–99)
Potassium: 3.3 mmol/L — ABNORMAL LOW (ref 3.5–5.1)
Sodium: 141 mmol/L (ref 135–145)

## 2024-07-30 LAB — PROTIME-INR
INR: 1.1 (ref 0.8–1.2)
Prothrombin Time: 14.7 s (ref 11.4–15.2)

## 2024-07-30 MED ORDER — POTASSIUM CHLORIDE CRYS ER 20 MEQ PO TBCR
40.0000 meq | EXTENDED_RELEASE_TABLET | Freq: Two times a day (BID) | ORAL | Status: AC
  Administered 2024-07-30 (×2): 40 meq via ORAL
  Filled 2024-07-30 (×2): qty 2

## 2024-07-30 MED ORDER — FUROSEMIDE 10 MG/ML IJ SOLN
80.0000 mg | Freq: Two times a day (BID) | INTRAMUSCULAR | Status: AC
  Administered 2024-07-30 – 2024-07-31 (×2): 80 mg via INTRAVENOUS
  Filled 2024-07-30 (×2): qty 8

## 2024-07-30 MED ORDER — OXYCODONE HCL 5 MG PO TABS
10.0000 mg | ORAL_TABLET | ORAL | Status: DC | PRN
  Administered 2024-07-30 – 2024-07-31 (×4): 10 mg via ORAL
  Filled 2024-07-30 (×4): qty 2

## 2024-07-30 NOTE — Evaluation (Signed)
 Physical Therapy Evaluation Patient Details Name: Christopher Riley MRN: [id number] DOB: [birth year] Today's Date: 07/30/2024  History of Present Illness  Pt is a 74 y.o. male who presented 07/28/24 due to SOB and LE edema. PMH: HF, GERD, HTN, HLD, OSA, DM2 afib  Clinical Impression  Pt admitted with above diagnosis. PT reports PTA living alone in single level home with 3 steps to enter, ind with rollator walker, drives, completes simple meals, has a cleaning lady, grocery shops, does have a daughter locally but she has medical issues. On eval, pt with generalized weakness, slow to power to stand using rocking momentum and BUE to assist in rising from elevated bed. Trialed bed set to rollator seat height and pt able to power to stand again using rocking momentum and no physical assistance from therapists. Pt amb 60 ft with rollator, CGA, no knee buckling or near falls, decreased cadence with forward flexed posture and flat foot gait. Pt reports he wants to return home, reports [facility] and recommended to reach out to have that complete so pt can age in place as he reports that is his wish. Recommend HHPT and pt verbalizes agreement. Pt currently with functional limitations due to the deficits listed below (see PT Problem List). Pt will benefit from acute skilled PT to increase their independence and safety with mobility to allow discharge.           If plan is discharge home, recommend the following: A little help with walking and/or transfers;A little help with bathing/dressing/bathroom;Assistance with cooking/housework;Assist for transportation;Help with stairs or ramp for entrance   Can travel by private vehicle        Equipment Recommendations None recommended by PT  Recommendations for Other Services       Functional Status Assessment Patient has had a recent decline in their functional status and demonstrates the ability to make significant improvements in  function in a reasonable and predictable amount of time.     Precautions / Restrictions Precautions Precautions: Fall Recall of Precautions/Restrictions: Intact Precaution/Restrictions Comments: Pt reproted have not had a recent fall but had one in the past they needed medical services to attempt to get off the floor Restrictions Weight Bearing Restrictions Per Provider Order: No      Mobility  Bed Mobility Overal bed mobility: Needs Assistance Bed Mobility: Supine to Sit     Supine to sit: HOB elevated, Used rails, Supervision     General bed mobility comments: pt reports uses rails/HOB at home and takes increase in time at baseline    Transfers Overall transfer level: Needs assistance Equipment used: Rollator (4 wheels) Transfers: Sit to/from Stand Sit to Stand: Contact guard assist, From elevated surface           General transfer comment: pt will attempt to max height of surface and education about practicing about matching atleast 4ww    Ambulation/Gait Ambulation/Gait assistance: Contact guard assist Gait Distance (Feet): 60 Feet Assistive device: Rollator (4 wheels) Gait Pattern/deviations: Decreased step length - right, Decreased step length - left, Decreased stride length, Trunk flexed, Wide base of support Gait velocity: decreased     General Gait Details: short steps near step through gait pattern, decreased cadence, flat foot posture with decreased heel-toe pattern, wide BOS and trunk forward flexed onto rollator  Stairs            Wheelchair Mobility     Tilt Bed    Modified Rankin (Stroke Patients  Only)       Balance Overall balance assessment: Needs assistance Sitting-balance support: Feet supported, Bilateral upper extremity supported Sitting balance-Leahy Scale: Fair     Standing balance support: Bilateral upper extremity supported, During functional activity, Reliant on assistive device for balance Standing balance-Leahy Scale:  Poor                               Pertinent Vitals/Pain Pain Assessment Pain Assessment: 0-10 Pain Score: 8  Pain Location: RLE ankle/skin Pain Descriptors / Indicators: Discomfort, Grimacing Pain Intervention(s): Limited activity within patient's tolerance, Monitored during session, Repositioned, Patient requesting pain meds-RN notified    Home Living Family/patient expects to be discharged to:: Private residence Living Arrangements: Alone Available Help at Discharge: Family;Available PRN/intermittently Type of Home: House Home Access: Stairs to enter Entrance Stairs-Rails: None Entrance Stairs-Number of Steps: 3   Home Layout: One level Home Equipment: Rollator (4 wheels);Shower seat;Grab bars - toilet;Grab bars - tub/shower;Adaptive equipment;Hospital bed      Prior Function Prior Level of Function : Independent/Modified Independent;Driving             Mobility Comments: pt reports ind with rollator ADLs Comments: pt reports indep and would complete in standing     Extremity/Trunk Assessment   Upper Extremity Assessment Upper Extremity Assessment: Defer to OT evaluation    Lower Extremity Assessment Lower Extremity Assessment: Generalized weakness (reports peripheral neuropathy in bil feet)    Cervical / Trunk Assessment Cervical / Trunk Assessment: Kyphotic  Communication   Communication Communication: Impaired Factors Affecting Communication: Reduced clarity of speech    Cognition Arousal: Alert Behavior During Therapy: WFL for tasks assessed/performed   PT - Cognitive impairments: No apparent impairments                         Following commands: Intact       Cueing Cueing Techniques: Verbal cues     General Comments      Exercises     Assessment/Plan    PT Assessment Patient needs continued PT services  PT Problem List Decreased strength;Decreased activity tolerance;Decreased balance;Cardiopulmonary status  limiting activity;Obesity;Impaired sensation;Pain       PT Treatment Interventions DME instruction;Gait training;Stair training;Functional mobility training;Therapeutic activities;Therapeutic exercise;Balance training;Patient/family education    PT Goals (Current goals can be found in the Care Plan section)  Acute Rehab PT Goals Patient Stated Goal: return home PT Goal Formulation: With patient Time For Goal Achievement: 08/13/24 Potential to Achieve Goals: Good    Frequency Min 3X/week     Co-evaluation PT/OT/SLP Co-Evaluation/Treatment: Yes Reason for Co-Treatment: Complexity of the patient's impairments (multi-system involvement) PT goals addressed during session: Mobility/safety with mobility;Balance;Proper use of DME OT goals addressed during session: ADL's and self-care       AM-PAC PT 6 Clicks Mobility  Outcome Measure Help needed turning from your back to your side while in a flat bed without using bedrails?: A Lot Help needed moving from lying on your back to sitting on the side of a flat bed without using bedrails?: A Lot Help needed moving to and from a bed to a chair (including a wheelchair)?: A Little Help needed standing up from a chair using your arms (e.g., wheelchair or bedside chair)?: A Little Help needed to walk in hospital room?: A Little Help needed climbing 3-5 steps with a railing? : A Lot 6 Click Score: 15    End of  Session Equipment Utilized During Treatment: Gait belt Activity Tolerance: Patient tolerated treatment well Patient left: in chair;with call bell/phone within reach;with chair alarm set Nurse Communication: Mobility status PT Visit Diagnosis: Other abnormalities of gait and mobility (R26.89);Muscle weakness (generalized) (M62.81);Pain Pain - Right/Left: Right Pain - part of body: Ankle and joints of foot    Time: 8944-8864 PT Time Calculation (min) (ACUTE ONLY): 40 min   Charges:   PT Evaluation $PT Eval Low Complexity: 1  Low PT Treatments $Therapeutic Activity: 8-22 mins PT General Charges $$ ACUTE PT VISIT: 1 Visit         Tori Riker Collier PT, DPT 07/30/2024, 12:03 PM

## 2024-07-30 NOTE — Evaluation (Signed)
 Occupational Therapy Evaluation Patient Details Name: Christopher Riley MRN: [id number] DOB: [birth year] Today's Date: 07/30/2024   History of Present Illness   Pt is a 74 y.o. male who presented 07/28/24 due to SOB and LE edema. PMH: HF, GERD, HTN, HLD, OSA, DM2 afib     Clinical Impressions Pt reported at PLOF they live alone and they do not have family support as his daughter lives in [place] but has medical issues. He lives in a single level home with a 3 STE without a rail and uses a 4WW for all mobility. Pt reported he was driving and getting his own groceries. At this time needs max increase in time for all tasks and cues on pacing with breathing throughout the session. Pt requires set up for UE and typically uses AE to complete all LE ADLS.  He reports he wants to go home and is agreeable to [facility] services. It is recommended they have a ramp to be installed ([facility] offered to [facility] but declined) , look into groceries being delivered to home/ Meals on Wheels, use of BSC and energy conservation with monitoring of oxygen with the return to home.      If plan is discharge home, recommend the following:   Assistance with cooking/housework;Assist for transportation;Help with stairs or ramp for entrance     Functional Status Assessment   Patient has had a recent decline in their functional status and demonstrates the ability to make significant improvements in function in a reasonable and predictable amount of time.     Equipment Recommendations   None recommended by OT     Recommendations for Other Services         Precautions/Restrictions   Precautions Precautions: Fall Recall of Precautions/Restrictions: Intact Precaution/Restrictions Comments: Pt reproted have not had a recent fall but had one in the past they needed medical services to attempt to get off the floor Restrictions Weight Bearing Restrictions Per Provider Order: No     Mobility Bed  Mobility Overal bed mobility: Needs Assistance Bed Mobility: Supine to Sit     Supine to sit: HOB elevated, Used rails     General bed mobility comments: pt reports uses rails/HOB at home and takes increase in time at baseline    Transfers Overall transfer level: Needs assistance Equipment used: Rollator (4 wheels) Transfers: Sit to/from Stand Sit to Stand: Contact guard assist, From elevated surface           General transfer comment: pt will attempt to max height of surface and education about practicing about matching atleast 4ww      Balance Overall balance assessment: Needs assistance Sitting-balance support: Feet supported, Bilateral upper extremity supported Sitting balance-Leahy Scale: Fair Sitting balance - Comments: when attempting to complete BLE needs atleast unlater UE support   Standing balance support: Bilateral upper extremity supported Standing balance-Leahy Scale: Fair Standing balance comment: relaint on4ww                           ADL either performed or assessed with clinical judgement   ADL Overall ADL's : Needs assistance/impaired Eating/Feeding: Independent;Sitting   Grooming: Wash/dry hands;Set up;Sitting   Upper Body Bathing: Set up;Sitting   Lower Body Bathing: With adaptive equipment;Cueing for safety;Cueing for sequencing;Contact guard assist;Minimal assistance;Sit to/from stand;Sitting/lateral leans   Upper Body Dressing : Set up;Sitting   Lower Body Dressing: Contact guard assist;Minimal assistance;With adaptive equipment;Cueing for safety;Cueing for sequencing;Sit to/from stand  Toilet Transfer: Contact guard assist;Cueing for safety;Cueing for sequencing Toilet Transfer Details (indicate cue type and reason): needs to rock multiple time before completion Toileting- Clothing Manipulation and Hygiene: Contact guard assist;Minimal assistance;Sit to/from stand       Functional mobility during ADLs: Contact guard  assist;Minimal assistance;Cueing for safety;Cueing for sequencing;Rolling walker (2 wheels)       Vision Baseline Vision/History: 0 No visual deficits Ability to See in Adequate Light: 0 Adequate Patient Visual Report: No change from baseline Vision Assessment?: No apparent visual deficits     Perception Perception: Within Functional Limits       Praxis Praxis: WFL       Pertinent Vitals/Pain Pain Assessment Pain Assessment: 0-10 Pain Score: 8  Pain Location: RLE ankle/skin Pain Descriptors / Indicators: Discomfort, Grimacing Pain Intervention(s): Limited activity within patient's tolerance, Monitored during session, Repositioned, Patient requesting pain meds-RN notified     Extremity/Trunk Assessment Upper Extremity Assessment Upper Extremity Assessment: Generalized weakness;Right hand dominant (BUE decrease in AROM to about 90 deg shoulder flexion but WFL to reach for washing hair and peri care)   Lower Extremity Assessment Lower Extremity Assessment: Defer to PT evaluation   Cervical / Trunk Assessment Cervical / Trunk Assessment: Kyphotic   Communication Communication Communication: Impaired Factors Affecting Communication: Reduced clarity of speech   Cognition Arousal: Alert Behavior During Therapy: WFL for tasks assessed/performed Cognition: No apparent impairments                               Following commands: Intact       Cueing  General Comments   Cueing Techniques: Verbal cues      Exercises     Shoulder Instructions      Home Living Family/patient expects to be discharged to:: Private residence Living Arrangements: Alone Available Help at Discharge: Family   Home Access: Stairs to enter Secretary/administrator of Steps: 3 Entrance Stairs-Rails: None Home Layout: One level     Bathroom Shower/Tub: Producer, Television/film/video: Standard Bathroom Accessibility: Yes How Accessible: Accessible via walker Home  Equipment: Rollator (4 wheels);Shower seat;Grab bars - toilet;Grab bars - tub/shower;Adaptive equipment Adaptive Equipment: Reacher;Sock aid        Prior Functioning/Environment Prior Level of Function : Independent/Modified Independent;Driving             Mobility Comments: pt reports ind with rollator ADLs Comments: pt reports indep and would complete in standing    OT Problem List: Decreased strength;Decreased activity tolerance;Impaired balance (sitting and/or standing);Decreased safety awareness;Decreased knowledge of use of DME or AE;Pain   OT Treatment/Interventions: Self-care/ADL training;Therapeutic exercise;DME and/or AE instruction;Energy conservation;Therapeutic activities;Balance training;Patient/family education      OT Goals(Current goals can be found in the care plan section)   Acute Rehab OT Goals Patient Stated Goal: to go home OT Goal Formulation: With patient Time For Goal Achievement: 08/13/24 Potential to Achieve Goals: Good   OT Frequency:  Min 2X/week    Co-evaluation PT/OT/SLP Co-Evaluation/Treatment: Yes Reason for Co-Treatment: Complexity of the patient's impairments (multi-system involvement)   OT goals addressed during session: ADL's and self-care      AM-PAC OT 6 Clicks Daily Activity     Outcome Measure Help from another person eating meals?: None Help from another person taking care of personal grooming?: None Help from another person toileting, which includes using toliet, bedpan, or urinal?: A Little Help from another person bathing (including washing, rinsing, drying)?: A Little  Help from another person to put on and taking off regular upper body clothing?: None Help from another person to put on and taking off regular lower body clothing?: A Little 6 Click Score: 21   End of Session Equipment Utilized During Treatment: Gait belt;Rollator (4 wheels) Nurse Communication: Mobility status  Activity Tolerance: Patient tolerated  treatment well Patient left: in chair;with call bell/phone within reach;with chair alarm set  OT Visit Diagnosis: Other abnormalities of gait and mobility (R26.89);Muscle weakness (generalized) (M62.81);Pain Pain - Right/Left: Right Pain - part of body: Ankle and joints of foot                Time: 8965-8864 OT Time Calculation (min): 61 min Charges:  OT General Charges $OT Visit: 1 Visit OT Evaluation $OT Eval Low Complexity: 1 Low OT Treatments $Self Care/Home Management : 8-22 mins  Warrick POUR OTR/L  Acute Rehab Services  [phone] office number   Warrick Berber 07/30/2024, 11:48 AM

## 2024-07-30 NOTE — Progress Notes (Signed)
 [facility] PROGRESS NOTE    Progress Note  Christopher Riley  FMW:[id number] DOB: 10/24/1950 DOA: 07/28/2024 PCP: Claude Reagin, NP     Brief Narrative:   Christopher Riley is an 74 y.o. male past medical history significant for HFrEF unknown EF, essential hypertension, obstructive sleep apnea who does not follow-up much with his cardiology or PCP comes in for shortness of breath and lower extremity swelling, he relates he is taking his diuretic torsemide and Aldactone with no improvement.  CT angio of the chest showed no evidence of PE, BNP was 350.  Going back to the chart in February 2025 he weighed about 155 kg, this last visit to the ED he was weighing 170 kg.  Assessment/Plan:   Shortness of breath and lower extremity edema: 2D echo showed an EF of 55% no regional wall motion abnormality some degree of left diastolic dysfunction right ventricle was poorly visualized.  This tricuspid valve was poorly visualized. He was started on IV Lasix  is negative about 1-1/2 L. Continue Coreg  and losartan . Monitor strict I's and O's and daily weights. PT eval is pending. He is on Seroquel  at home I see no documentation of psychiatric disorder he cannot tell me why he takes Seroquel .  Question if it is for PTSD but there is no documentation older notes  Essential hypertension: Blood pressure is well-controlled continue Lasix  Coreg  and ARB.  Hyperlipidemia Continue statins.  Type 2 diabetes mellitus without complications (HCC) Last hemoglobin A1c was 6.5 at home he is on [facility] and Jardiance . Continue Jardiance  and sliding scale.  Obstructive sleep apnea/obesity hypoventilation syndrome: Noncompliant with the CPAP at home.  History of paroxysmal atrial fibrillation: He follows up at the [facility] he has declined DOAC in the past. Rate controlled on Coreg  continue.  Twelve-lead EKG showed sinus rhythm left axis deviation nonspecific T wave changes  Severe morbid obesity  (HCC): Noted  Normocytic anemia: Noted.  History of prostate cancer : he cannot provide me any more details I see only documentation from 2022, he is not on chemotherapy.    DVT prophylaxis: lovenox  Family Communication:none Status is: Inpatient Remains inpatient appropriate because: Shortness of breath and lower extremity edema    Code Status:     Code Status Orders  (From admission, onward)           Start     Ordered   07/29/24 0542  Full code  Continuous       Question:  By:  Answer:  Consent: discussion documented in EHR   07/29/24 0541           Code Status History     This patient has a current code status but no historical code status.         IV Access:   Peripheral IV   Procedures and diagnostic studies:   ECHOCARDIOGRAM COMPLETE Result Date: 07/29/2024    ECHOCARDIOGRAM REPORT   Patient Name:   Christopher Riley Date of Exam: 07/29/2024 Medical Rec #:  [id number]        Height:       74.0 in Accession #:    [id number]       Weight:       375.0 lb Date of Birth:  1951-03-14        BSA:          2.839 m Patient Age:    73 years         BP:  125/59 mmHg Patient Gender: M                HR:           79 bpm. Exam Location:  Inpatient Procedure: 2D Echo, Color Doppler, Cardiac Doppler and Intracardiac            Opacification Agent (Both Spectral and Color Flow Doppler were            utilized during procedure). Indications:    CHF I50.21  History:        Patient has no prior history of Echocardiogram examinations.                 CHF; Risk Factors:Hypertension, Diabetes and Dyslipidemia.  Sonographer:    Tinnie Gosling RDCS Referring Phys: [id number] ROBERT DORRELL IMPRESSIONS  1. Very limited study due to poor sound transmission and patient's body habitus. Commentary on the patient's valvular and right ventricular function cannot be made by this current study.  2. Left ventricular ejection fraction, by estimation, is 55 to 60%. The left ventricle has  normal function. The left ventricle has no regional wall motion abnormalities. There is moderate concentric left ventricular hypertrophy. Left ventricular diastolic function could not be evaluated.  3. Right ventricular systolic function was not well visualized. The right ventricular size is not well visualized. Tricuspid regurgitation signal is inadequate for assessing PA pressure.  4. The mitral valve was poorly visualized prohbiting adequate assessment.  5. The tricuspid valve was poorly visualized prohibiting adequate assessment.  6. The aortic valve is tricuspid, but aortic regurgitation and stenosis could not be assessed. Comparison(s): No prior Echocardiogram. Conclusion(s)/Recommendation(s): LV systolci function is clearly preserved but this study is otherwise non-diagnostic for any right ventricular or valvular pathology. Consider obtaining a cardiac MRI or TEE if information on the right ventricle or valves  is required. FINDINGS  Left Ventricle: Left ventricular ejection fraction, by estimation, is 55 to 60%. The left ventricle has normal function. The left ventricle has no regional wall motion abnormalities. The left ventricular internal cavity size was normal in size. There is  moderate concentric left ventricular hypertrophy. Left ventricular diastolic function could not be evaluated. Right Ventricle: The right ventricular size is not well visualized. Right vetricular wall thickness was not well visualized. Right ventricular systolic function was not well visualized. Tricuspid regurgitation signal is inadequate for assessing PA pressure. Left Atrium: Left atrial size was not well visualized. Right Atrium: Right atrial size was not well visualized. Pericardium: There is no evidence of pericardial effusion. Mitral Valve: The mitral valve was poorly visualized prohbiting adequate assessment. Tricuspid Valve: The tricuspid valve was poorly visualized prohibiting adequate assessment. Aortic Valve: The  aortic valve is tricuspid, but aortic regurgitation and stenosis could not be assessed. Pulmonic Valve: The pulmonic valve was not well visualized. Pulmonic valve regurgitation is not visualized. No evidence of pulmonic stenosis. Aorta: The aortic root and ascending aorta are structurally normal, with no evidence of dilitation. Venous: The inferior vena cava was not well visualized. IAS/Shunts: The interatrial septum was not well visualized.  LEFT VENTRICLE PLAX 2D LVIDd:         4.30 cm   Diastology LVIDs:         3.00 cm   LV e' medial:    11.40 cm/s LV PW:         1.50 cm   LV E/e' medial:  5.8 LV IVS:        1.40 cm   LV e'  lateral:   6.64 cm/s LVOT diam:     2.10 cm   LV E/e' lateral: 10.0 LVOT Area:     3.46 cm  IVC IVC diam: 1.40 cm LEFT ATRIUM         Index LA diam:    4.70 cm 1.66 cm/m   AORTA Ao Root diam: 3.20 cm Ao Asc diam:  3.20 cm MITRAL VALVE MV Area (PHT): 5.24 cm    SHUNTS MV E velocity: 66.40 cm/s  Systemic Diam: 2.10 cm MV A velocity: 66.80 cm/s MV E/A ratio:  0.99 Georganna Archer Electronically signed by Georganna Archer Signature Date/Time: 07/29/2024/2:18:37 PM    Final    CT Angio Chest PE W and/or Wo Contrast Result Date: 07/29/2024 EXAM: CTA CHEST 07/29/2024 12:26:08 AM TECHNIQUE: CTA of the chest was performed without and with the administration of 75 mL of intravenous iohexol  (OMNIPAQUE ) 350 MG/ML injection. Multiplanar reformatted images are provided for review. MIP images are provided for review. Automated exposure control, iterative reconstruction, and/or weight based adjustment of the mA/kV was utilized to reduce the radiation dose to as low as reasonably achievable. COMPARISON: None available. CLINICAL HISTORY: Pulmonary embolism (PE) suspected, low to intermediate prob, positive D-dimer. FINDINGS: PULMONARY ARTERIES: Pulmonary arteries are adequately opacified for evaluation. No acute pulmonary embolus. Main pulmonary artery is normal in caliber. MEDIASTINUM: The heart and  pericardium demonstrate no acute abnormality. There is no acute abnormality of the thoracic aorta. LYMPH NODES: No mediastinal, hilar or axillary lymphadenopathy. LUNGS AND PLEURA: Dependent ground glass opacities, likely dependent atelectasis. No confluent opacities. No effusions. No pneumothorax. UPPER ABDOMEN: Limited images of the upper abdomen demonstrate a moderate-sized hiatal hernia. SOFT TISSUES AND BONES: No acute bone or soft tissue abnormality. IMPRESSION: 1. No pulmonary embolism. 2. Moderate-sized hiatal hernia. 3. No acute cardiopulmonary disease. Electronically signed by: Franky Crease MD 07/29/2024 12:29 AM EST RP Workstation: [id number]   DG Chest 2 View Result Date: 07/28/2024 CLINICAL DATA:  Shortness of breath EXAM: CHEST - 2 VIEW COMPARISON:  07/21/2024, 09/28/2012 FINDINGS: Hypoventilatory changes. Cardiomegaly with mild central congestion. Prominent appearing central pulmonary vessels. No consolidation or pneumothorax. Biapical pleural thickening is chronic. IMPRESSION: Cardiomegaly with mild central congestion. Prominent appearing central pulmonary vessels, possible pulmonary arterial hypertension. Electronically Signed   By: Luke Bun M.D.   On: 07/28/2024 16:18     Medical Consultants:   None.   Subjective:    Christopher Riley relates his shortness of breath is improved this morning.  Objective:    Vitals:   07/29/24 1801 07/29/24 2159 07/30/24 0500 07/30/24 0553  BP: 136/68 118/70  (!) 122/56  Pulse: 74 75  82  Resp: 17     Temp: 98.3 F (36.8 C) 98.4 F (36.9 C)  98.5 F (36.9 C)  TempSrc:  Oral  Oral  SpO2: 99% 100%  97%  Weight:   (!) 172.3 kg   Height:       SpO2: 97 % O2 Flow Rate (L/min): 2 L/min   Intake/Output Summary (Last 24 hours) at 07/30/2024 0725 Last data filed at 07/30/2024 9386 Gross per 24 hour  Intake 720 ml  Output 2050 ml  Net -1330 ml   Filed Weights   07/28/24 1508 07/30/24 0500  Weight: (!) 170.1 kg (!) 172.3 kg     Exam: General exam: In no acute distress.  When I was in the room he was in the bed without his oxygen breathing comfortably. Respiratory system: Good air movement and clear to auscultation.  Cardiovascular system: S1 & S2 heard, RRR. No JVD. Gastrointestinal system: Abdomen is nondistended, soft and nontender.  Extremities: 3+ edema Skin: No rashes, lesions or ulcers Psychiatry: Judgement and insight appear normal. Mood & affect appropriate.  Data Reviewed:    Labs: Basic Metabolic Panel: Recent Labs  Lab 07/28/24 1525 07/30/24 0331  NA 142 141  K 3.9 3.3*  CL 104 102  CO2 27 30  GLUCOSE 106* 92  BUN 15 16  CREATININE 0.83 1.01  CALCIUM  9.1 8.7*   GFR Estimated Creatinine Clearance: 105.7 mL/min (by C-G formula based on SCr of 1.01 mg/dL). Liver Function Tests: No results for input(s): AST, ALT, ALKPHOS, BILITOT, PROT, ALBUMIN in the last 168 hours. No results for input(s): LIPASE, AMYLASE in the last 168 hours. No results for input(s): AMMONIA in the last 168 hours. Coagulation profile Recent Labs  Lab 07/30/24 0331  INR 1.1   COVID-19 Labs  Recent Labs    07/28/24 2315  DDIMER 1.26*    No results found for: SARSCOV2NAA  CBC: Recent Labs  Lab 07/28/24 1525 07/30/24 0331  WBC 8.5 8.3  HGB 12.9* 11.8*  HCT 42.7 38.4*  MCV 87.5 84.6  PLT 300 298   Cardiac Enzymes: No results for input(s): CKTOTAL, CKMB, CKMBINDEX, TROPONINI in the last 168 hours. BNP (last 3 results) Recent Labs    07/21/24 0039 07/28/24 1525  PROBNP 351.0* 180.0   CBG: No results for input(s): GLUCAP in the last 168 hours. D-Dimer: Recent Labs    07/28/24 2315  DDIMER 1.26*   Hgb A1c: No results for input(s): HGBA1C in the last 72 hours. Lipid Profile: No results for input(s): CHOL, HDL, LDLCALC, TRIG, CHOLHDL, LDLDIRECT in the last 72 hours. Thyroid function studies: No results for input(s): TSH, T4TOTAL, T3FREE,  THYROIDAB in the last 72 hours.  Invalid input(s): FREET3 Anemia work up: No results for input(s): VITAMINB12, FOLATE, FERRITIN, TIBC, IRON, RETICCTPCT in the last 72 hours. Sepsis Labs: Recent Labs  Lab 07/28/24 1525 07/30/24 0331  WBC 8.5 8.3   Microbiology No results found for this or any previous visit (from the past 240 hours).   Medications:    carvedilol   3.125 mg Oral BID WC   empagliflozin   10 mg Oral Daily   enoxaparin  (LOVENOX ) injection  80 mg Subcutaneous Q24H   furosemide   80 mg Intravenous Q12H   latanoprost   1 drop Both Eyes QHS   losartan   25 mg Oral Daily   QUEtiapine   400 mg Oral QHS   rosuvastatin   20 mg Oral QHS   Continuous Infusions:    LOS: 1 day   Erle Odell Castor  [facility]  07/30/2024, 7:25 AM

## 2024-07-31 LAB — BASIC METABOLIC PANEL WITH GFR
Anion gap: 7 (ref 5–15)
BUN: 15 mg/dL (ref 8–23)
CO2: 33 mmol/L — ABNORMAL HIGH (ref 22–32)
Calcium: 8.9 mg/dL (ref 8.9–10.3)
Chloride: 99 mmol/L (ref 98–111)
Creatinine, Ser: 1.1 mg/dL (ref 0.61–1.24)
GFR, Estimated: >60 mL/min
Glucose, Bld: 109 mg/dL — ABNORMAL HIGH (ref 70–99)
Potassium: 3.8 mmol/L (ref 3.5–5.1)
Sodium: 139 mmol/L (ref 135–145)

## 2024-07-31 MED ORDER — FUROSEMIDE 40 MG PO TABS: 80.0000 mg | ORAL_TABLET | Freq: Two times a day (BID) | ORAL | Status: DC

## 2024-07-31 MED ORDER — OXYCODONE HCL 5 MG PO TABS
15.0000 mg | ORAL_TABLET | ORAL | Status: DC | PRN
  Administered 2024-08-01: 15 mg via ORAL
  Filled 2024-07-31: qty 3

## 2024-07-31 MED ORDER — FUROSEMIDE 10 MG/ML IJ SOLN
80.0000 mg | Freq: Two times a day (BID) | INTRAMUSCULAR | Status: AC
  Administered 2024-07-31 (×2): 80 mg via INTRAVENOUS
  Filled 2024-07-31 (×2): qty 8

## 2024-07-31 NOTE — Progress Notes (Addendum)
 [facility] PROGRESS NOTE    Progress Note  Christopher Riley  FMW:[id number] DOB: 1950-10-29 DOA: 07/28/2024 PCP: Claude Reagin, NP     Brief Narrative:   Christopher Riley is an 74 y.o. male past medical history significant for HFrEF unknown EF, essential hypertension, obstructive sleep apnea who does not follow-up much with his cardiology or PCP comes in for shortness of breath and lower extremity swelling, he relates he is taking his diuretic torsemide and Aldactone with no improvement.  CT angio of the chest showed no evidence of PE, BNP was 350.  Going back to the chart in February 2025 he weighed about 155 kg, this last visit to the ED he was weighing 170 kg.  Assessment/Plan:   Acute left sided clinical Hear failure: 2D echo showed an EF of 55% no regional wall motion abnormality some degree of left diastolic dysfunction right ventricle was poorly visualized.  This tricuspid valve was poorly visualized. Continue IV Lasix  negative about 2-1/2 L, has been weaned to room air. Continue Coreg  and losartan . Monitor strict I's and O's and daily weights. PT and OT evaluated the patient, will need home health PT.  Essential hypertension: Blood pressure is well-controlled continue Lasix , Coreg  and ARB.  Hyperlipidemia Continue statins.  Type 2 diabetes mellitus without complications (HCC) Last hemoglobin A1c was 6.5 at home he is on [facility] and Jardiance . Continue Jardiance  and sliding scale.  Obstructive sleep apnea/obesity hypoventilation syndrome: Noncompliant with the CPAP at home.  History of paroxysmal atrial fibrillation: He follows up at the [facility] he has declined DOAC in the past. Rate controlled on Coreg  continue.  Twelve-lead EKG showed sinus rhythm left axis deviation nonspecific T wave changes  Severe morbid obesity (HCC): Noted  Normocytic anemia: Noted.  History of prostate cancer : he cannot provide me any more details I see only documentation from 2022,  he is not on chemotherapy.    DVT prophylaxis: lovenox  Family Communication:none Status is: Inpatient Remains inpatient appropriate because: Shortness of breath and lower extremity edema    Code Status:     Code Status Orders  (From admission, onward)           Start     Ordered   07/29/24 0542  Full code  Continuous       Question:  By:  Answer:  Consent: discussion documented in EHR   07/29/24 0541           Code Status History     This patient has a current code status but no historical code status.         IV Access:   Peripheral IV   Procedures and diagnostic studies:   ECHOCARDIOGRAM COMPLETE Result Date: 07/29/2024    ECHOCARDIOGRAM REPORT   Patient Name:   Christopher Riley Date of Exam: 07/29/2024 Medical Rec #:  [id number]        Height:       74.0 in Accession #:    [id number]       Weight:       375.0 lb Date of Birth:  1950/08/02        BSA:          2.839 m Patient Age:    73 years         BP:           125/59 mmHg Patient Gender: M                HR:  79 bpm. Exam Location:  Inpatient Procedure: 2D Echo, Color Doppler, Cardiac Doppler and Intracardiac            Opacification Agent (Both Spectral and Color Flow Doppler were            utilized during procedure). Indications:    CHF I50.21  History:        Patient has no prior history of Echocardiogram examinations.                 CHF; Risk Factors:Hypertension, Diabetes and Dyslipidemia.  Sonographer:    Tinnie Gosling RDCS Referring Phys: [id number] ROBERT DORRELL IMPRESSIONS  1. Very limited study due to poor sound transmission and patient's body habitus. Commentary on the patient's valvular and right ventricular function cannot be made by this current study.  2. Left ventricular ejection fraction, by estimation, is 55 to 60%. The left ventricle has normal function. The left ventricle has no regional wall motion abnormalities. There is moderate concentric left ventricular hypertrophy. Left ventricular  diastolic function could not be evaluated.  3. Right ventricular systolic function was not well visualized. The right ventricular size is not well visualized. Tricuspid regurgitation signal is inadequate for assessing PA pressure.  4. The mitral valve was poorly visualized prohbiting adequate assessment.  5. The tricuspid valve was poorly visualized prohibiting adequate assessment.  6. The aortic valve is tricuspid, but aortic regurgitation and stenosis could not be assessed. Comparison(s): No prior Echocardiogram. Conclusion(s)/Recommendation(s): LV systolci function is clearly preserved but this study is otherwise non-diagnostic for any right ventricular or valvular pathology. Consider obtaining a cardiac MRI or TEE if information on the right ventricle or valves  is required. FINDINGS  Left Ventricle: Left ventricular ejection fraction, by estimation, is 55 to 60%. The left ventricle has normal function. The left ventricle has no regional wall motion abnormalities. The left ventricular internal cavity size was normal in size. There is  moderate concentric left ventricular hypertrophy. Left ventricular diastolic function could not be evaluated. Right Ventricle: The right ventricular size is not well visualized. Right vetricular wall thickness was not well visualized. Right ventricular systolic function was not well visualized. Tricuspid regurgitation signal is inadequate for assessing PA pressure. Left Atrium: Left atrial size was not well visualized. Right Atrium: Right atrial size was not well visualized. Pericardium: There is no evidence of pericardial effusion. Mitral Valve: The mitral valve was poorly visualized prohbiting adequate assessment. Tricuspid Valve: The tricuspid valve was poorly visualized prohibiting adequate assessment. Aortic Valve: The aortic valve is tricuspid, but aortic regurgitation and stenosis could not be assessed. Pulmonic Valve: The pulmonic valve was not well visualized. Pulmonic  valve regurgitation is not visualized. No evidence of pulmonic stenosis. Aorta: The aortic root and ascending aorta are structurally normal, with no evidence of dilitation. Venous: The inferior vena cava was not well visualized. IAS/Shunts: The interatrial septum was not well visualized.  LEFT VENTRICLE PLAX 2D LVIDd:         4.30 cm   Diastology LVIDs:         3.00 cm   LV e' medial:    11.40 cm/s LV PW:         1.50 cm   LV E/e' medial:  5.8 LV IVS:        1.40 cm   LV e' lateral:   6.64 cm/s LVOT diam:     2.10 cm   LV E/e' lateral: 10.0 LVOT Area:     3.46 cm  IVC IVC diam:  1.40 cm LEFT ATRIUM         Index LA diam:    4.70 cm 1.66 cm/m   AORTA Ao Root diam: 3.20 cm Ao Asc diam:  3.20 cm MITRAL VALVE MV Area (PHT): 5.24 cm    SHUNTS MV E velocity: 66.40 cm/s  Systemic Diam: 2.10 cm MV A velocity: 66.80 cm/s MV E/A ratio:  0.99 Georganna Archer Electronically signed by Georganna Archer Signature Date/Time: 07/29/2024/2:18:37 PM    Final      Medical Consultants:   None.   Subjective:    WAI LITT relates his shortness of breath is improved this morning.  Objective:    Vitals:   07/30/24 1347 07/30/24 2150 07/31/24 0500 07/31/24 0531  BP: 125/60 134/64  (!) 148/64  Pulse: 82 75  81  Resp: 18 18  18   Temp: 98.6 F (37 C) 98 F (36.7 C)  98.5 F (36.9 C)  TempSrc: Oral   Oral  SpO2: 96% 98%  95%  Weight:   (!) 170.6 kg   Height:       SpO2: 95 % O2 Flow Rate (L/min): 2 L/min   Intake/Output Summary (Last 24 hours) at 07/31/2024 0856 Last data filed at 07/31/2024 0530 Gross per 24 hour  Intake 840 ml  Output 1750 ml  Net -910 ml   Filed Weights   07/28/24 1508 07/30/24 0500 07/31/24 0500  Weight: (!) 170.1 kg (!) 172.3 kg (!) 170.6 kg    Exam: General exam: In no acute distress. Respiratory system: Good air movement and clear to auscultation. Cardiovascular system: S1 & S2 heard, RRR. No JVD. Gastrointestinal system: Abdomen is nondistended, soft and nontender.   Extremities: 2+ edema Skin: No rashes, lesions or ulcers Psychiatry: Judgement and insight appear normal. Mood & affect appropriate.  Data Reviewed:    Labs: Basic Metabolic Panel: Recent Labs  Lab 07/28/24 1525 07/30/24 0331  NA 142 141  K 3.9 3.3*  CL 104 102  CO2 27 30  GLUCOSE 106* 92  BUN 15 16  CREATININE 0.83 1.01  CALCIUM  9.1 8.7*   GFR Estimated Creatinine Clearance: 105.1 mL/min (by C-G formula based on SCr of 1.01 mg/dL). Liver Function Tests: No results for input(s): AST, ALT, ALKPHOS, BILITOT, PROT, ALBUMIN in the last 168 hours. No results for input(s): LIPASE, AMYLASE in the last 168 hours. No results for input(s): AMMONIA in the last 168 hours. Coagulation profile Recent Labs  Lab 07/30/24 0331  INR 1.1   COVID-19 Labs  Recent Labs    07/28/24 2315  DDIMER 1.26*    No results found for: SARSCOV2NAA  CBC: Recent Labs  Lab 07/28/24 1525 07/30/24 0331  WBC 8.5 8.3  HGB 12.9* 11.8*  HCT 42.7 38.4*  MCV 87.5 84.6  PLT 300 298   Cardiac Enzymes: No results for input(s): CKTOTAL, CKMB, CKMBINDEX, TROPONINI in the last 168 hours. BNP (last 3 results) Recent Labs    07/21/24 0039 07/28/24 1525  PROBNP 351.0* 180.0   CBG: No results for input(s): GLUCAP in the last 168 hours. D-Dimer: Recent Labs    07/28/24 2315  DDIMER 1.26*   Hgb A1c: No results for input(s): HGBA1C in the last 72 hours. Lipid Profile: No results for input(s): CHOL, HDL, LDLCALC, TRIG, CHOLHDL, LDLDIRECT in the last 72 hours. Thyroid function studies: No results for input(s): TSH, T4TOTAL, T3FREE, THYROIDAB in the last 72 hours.  Invalid input(s): FREET3 Anemia work up: No results for input(s): VITAMINB12, FOLATE, FERRITIN, TIBC, IRON,  RETICCTPCT in the last 72 hours. Sepsis Labs: Recent Labs  Lab 07/28/24 1525 07/30/24 0331  WBC 8.5 8.3   Microbiology No results found for this or any  previous visit (from the past 240 hours).   Medications:    carvedilol   3.125 mg Oral BID WC   empagliflozin   10 mg Oral Daily   enoxaparin  (LOVENOX ) injection  80 mg Subcutaneous Q24H   furosemide   80 mg Intravenous Q12H   latanoprost   1 drop Both Eyes QHS   losartan   25 mg Oral Daily   QUEtiapine   400 mg Oral QHS   rosuvastatin   20 mg Oral QHS   Continuous Infusions:    LOS: 2 days   Erle Odell Castor  [facility]  07/31/2024, 8:56 AM

## 2024-07-31 NOTE — Progress Notes (Signed)
 Mobility Specialist Progress Note:  During Mobility: 95-115 BPM HR  07/31/24 1502  Mobility  Activity Ambulated with assistance  Level of Assistance Minimal assist, patient does 75% or more  Assistive Device Four wheel walker  Distance Ambulated (ft) 120 ft  Activity Response Tolerated well  Mobility Referral Yes  Mobility visit 1 Mobility  Mobility Specialist Start Time (ACUTE ONLY) 1420  Mobility Specialist Stop Time (ACUTE ONLY) 1444  Mobility Specialist Time Calculation (min) (ACUTE ONLY) 24 min   Pt was received in recliner and agreed to mobility. Min A sit to stand. 2x brief standing breaks during ambulation. Pt wanted to return to rollator chair, left with all needs met. Call bell in reach.  [employer] - Mobility Specialist

## 2024-07-31 NOTE — Progress Notes (Signed)
 MD notified that patient is complaining of 8/10 left top shoulder pain. Patient told RN that he can barely lift left arm due to the pain. RN did give patient PRN oral 10 mg oxyCODONE  and a heat pack to see if it helps. Patient was requesting if it was possible to get an xray. MD told RN to continue pain control measures and MD will reevaluate tomorrow. Will pass along to night shift RN.

## 2024-07-31 NOTE — TOC Initial Note (Signed)
 Transition of Care [facility]) - Initial/Assessment Note    Patient Details  Name: Christopher Riley MRN: [id number] Date of Birth: 1951/06/12  Transition of Care [facility]) CM/SW Contact:    Tawni CHRISTELLA Eva, LCSW Phone Number: 07/31/2024, 3:57 PM  Clinical Narrative:                  CSW met with the pt at the bedside to discuss home health services. The pt is agreeable and has no agency preference. The pt reports that he lives alone at home and has no other home services in place. The pt has no DME needs at this time. He is requesting transportation assistance at the time of discharge. ICM to follow.   Expected Discharge Plan: Home w Home Health Services Barriers to Discharge: Continued Medical Work up   Patient Goals and CMS Choice Patient states their goals for this hospitalization and ongoing recovery are:: retrun home with home helth          Expected Discharge Plan and Services       Living arrangements for the past 2 months: Single Family Home                                      Prior Living Arrangements/Services Living arrangements for the past 2 months: Single Family Home Lives with:: Self Patient language and need for interpreter reviewed:: Yes Do you feel safe going back to the place where you live?: Yes      Need for Family Participation in Patient Care: No (Comment) Care giver support system in place?: No (comment)   Criminal Activity/Legal Involvement Pertinent to Current Situation/Hospitalization: No - Comment as needed  Activities of Daily Living   ADL Screening (condition at time of admission) Independently performs ADLs?: No Does the patient have a NEW difficulty with bathing/dressing/toileting/self-feeding that is expected to last >3 days?: Yes (Initiates electronic notice to provider for possible OT consult) Does the patient have a NEW difficulty with getting in/out of bed, walking, or climbing stairs that is expected to last >3 days?: Yes  (Initiates electronic notice to provider for possible PT consult) Does the patient have a NEW difficulty with communication that is expected to last >3 days?: No Is the patient deaf or have difficulty hearing?: No Does the patient have difficulty seeing, even when wearing glasses/contacts?: No Does the patient have difficulty concentrating, remembering, or making decisions?: No  Permission Sought/Granted                  Emotional Assessment Appearance:: Appears stated age Attitude/Demeanor/Rapport: Gracious Affect (typically observed): Accepting Orientation: : Oriented to Self, Oriented to Place, Oriented to  Time, Oriented to Situation   Psych Involvement: No (comment)  Admission diagnosis:  Shortness of breath [R06.02] CHF exacerbation (HCC) [I50.9] Patient Active Problem List   Diagnosis Date Noted   Acute clinical systolic heart failure (HCC) 07/29/2024   Normocytic anemia 07/29/2024   Body mass index (BMI) 40.0-44.9, adult (HCC) 03/20/2021   Morbid obesity (HCC) 03/20/2021   Xerosis cutis 03/20/2021   Asymptomatic varicose veins 01/23/2021   Barrett's esophagus 01/23/2021   Bilateral primary osteoarthritis of knee 01/23/2021   Cataract 01/23/2021   Dependence on continuous positive airway pressure ventilation 01/23/2021   Difficulty in walking, not elsewhere classified 01/23/2021   Disorder of the skin and subcutaneous tissue, unspecified 01/23/2021   Elevated prostate specific antigen (PSA) 01/23/2021  Encounter for dental examination 01/23/2021   Enthesopathy of ankle and tarsus 01/23/2021   First degree hemorrhoids 01/23/2021   Gastritis and gastroduodenitis with hemorrhage 01/23/2021   Generalized osteoarthrosis, involving multiple sites 01/23/2021   Hemorrhage of anus and rectum 01/23/2021   History of falling 01/23/2021   Hypersomnia with sleep apnea 01/23/2021   Lymphedema, not elsewhere classified 01/23/2021   Microscopic hematuria 01/23/2021   Muscle  weakness (generalized) 01/23/2021   Other abnormalities of gait and mobility 01/23/2021   Other joint derangement not elsewhere classified of lower leg 01/23/2021   Other reduced mobility 01/23/2021   Pain in right knee 01/23/2021   Other chronic pain 01/23/2021   Polyp of colon 01/23/2021   Presbyopia 01/23/2021   Presence of right artificial knee joint 01/23/2021   Primary open-angle glaucoma, right eye, severe stage 01/23/2021   Problem related to unspecified psychosocial circumstances 01/23/2021   Type 2 diabetes mellitus with hyperglycemia (HCC) 01/23/2021   Type 2 diabetes mellitus without complications (HCC) 01/23/2021   Urinary incontinence 01/23/2021   Calculus of kidney 01/23/2021   Plantar flexed metatarsal bone of left foot 01/23/2021   Intertrigo 08/03/2016   History of nephrolithiasis 02/18/2016   Scrotal swelling 06/27/2013   Chest pain 09/27/2012   Fatigue 08/15/2012   Neuropathy 03/22/2011   WEAKNESS 06/30/2010   ANKLE PAIN, RIGHT 04/02/2010   CONSTIPATION 08/28/2009   ANEMIA, SECONDARY TO CHRONIC BLOOD LOSS 08/21/2009   HEMORRHOIDS, WITH BLEEDING 07/28/2009   NECK PAIN 02/19/2009   HYPERTENSION 01/16/2009   SOMNOLENCE 01/16/2009   Obstructive sleep apnea 01/16/2009   SHOULDER PAIN, RIGHT 07/12/2008   Hyperlipidemia 03/22/2008   LOW BACK PAIN, CHRONIC 03/22/2008   ADENOCARCINOMA, PROSTATE 03/12/2008   OTHER ACUTE REACTIONS TO STRESS 03/12/2008   MEMORY LOSS 03/12/2008   PARESTHESIA 03/12/2008   History of prostate cancer 03/12/2008   PCP:  Claude Reagin, NP Pharmacy:   [id number] - [place] ([place]), [place] - [address] [place] ([place]) [place] [address] Phone: [phone] Fax: [phone]  [facility] - [place], [place] - [address] [place] [address] Phone: [phone] Fax: [phone]  [facility] - [facility] [address] [place] [address] Phone: [phone]  Fax: [phone]     Social Drivers of Health (SDOH) Social History: SDOH Screenings   Food Insecurity: No Food Insecurity (07/29/2024)  Housing: Low Risk (07/29/2024)  Transportation Needs: Unmet Transportation Needs (07/29/2024)  Utilities: Not At Risk (07/29/2024)  Social Connections: Socially Isolated (07/29/2024)  Tobacco Use: Low Risk (07/28/2024)   SDOH Interventions:     Readmission Risk Interventions     No data to display

## 2024-08-01 DIAGNOSIS — C61 Malignant neoplasm of prostate: Secondary | ICD-10-CM

## 2024-08-01 DIAGNOSIS — D649 Anemia, unspecified: Secondary | ICD-10-CM

## 2024-08-01 LAB — BASIC METABOLIC PANEL WITH GFR
Anion gap: 8 (ref 5–15)
BUN: 19 mg/dL (ref 8–23)
CO2: 33 mmol/L — ABNORMAL HIGH (ref 22–32)
Calcium: 8.7 mg/dL — ABNORMAL LOW (ref 8.9–10.3)
Chloride: 99 mmol/L (ref 98–111)
Creatinine, Ser: 1.29 mg/dL — ABNORMAL HIGH (ref 0.61–1.24)
GFR, Estimated: 58 mL/min — ABNORMAL LOW
Glucose, Bld: 108 mg/dL — ABNORMAL HIGH (ref 70–99)
Potassium: 3.6 mmol/L (ref 3.5–5.1)
Sodium: 139 mmol/L (ref 135–145)

## 2024-08-01 NOTE — Plan of Care (Signed)

## 2024-08-01 NOTE — TOC Transition Note (Signed)
 Transition of Care [facility]) - Discharge Note   Patient Details  Name: Christopher Riley MRN: [id number] Date of Birth: 09/09/1950  Transition of Care [facility]) CM/SW Contact:  Tawni CHRISTELLA Eva, LCSW Phone Number: 08/01/2024, 12:06 PM   Clinical Narrative:     Pt to d/c home with home health, pt has chosen [facility] for home health services. Pt is requesting transportation assistance to return home. CSW arranged Safe [employer] upon d/c. No further ICM need ICM sign off.    Final next level of care: Home w Home Health Services Barriers to Discharge: Continued Medical Work up   Patient Goals and CMS Choice Patient states their goals for this hospitalization and ongoing recovery are:: retrun home with home health          Discharge Placement                  Name of family member notified: Friend, Emergency Contact  [phone] (Mobile) Patient and family notified of of transfer: 08/01/24  Discharge Plan and Services Additional resources added to the After Visit Summary for                                       Social Drivers of Health (SDOH) Interventions SDOH Screenings   Food Insecurity: No Food Insecurity (07/29/2024)  Housing: Low Risk (07/29/2024)  Transportation Needs: Unmet Transportation Needs (07/29/2024)  Utilities: Not At Risk (07/29/2024)  Social Connections: Socially Isolated (07/29/2024)  Tobacco Use: Low Risk (07/28/2024)     Readmission Risk Interventions     No data to display

## 2024-08-01 NOTE — Discharge Summary (Signed)
 "  Physician Discharge Summary  LAWRANCE WIEDEMANN FMW:[id number] DOB: 21-Jun-1951 DOA: 07/28/2024  PCP: Claude Reagin, NP  Admit date: 07/28/2024 Discharge date: 08/01/2024  Admitted From: Home Disposition: Home Recommendations for Outpatient Follow-up:  Outpatient follow-up with PCP and cardiology in 1 to 2 weeks Check fluid status, blood pressure, CMP and CBC at follow-up Please follow up on the following pending results: None  Home Health: HHPT/OT ordered Equipment/Devices: Patient has appropriate DME's.  Discharge Condition: Stable CODE STATUS: Full code   Contact information for follow-up providers     Claude Reagin, NP. Schedule an appointment as soon as possible for a visit in 1 week(s).   Specialty: Nurse Practitioner Contact information: [address] [place] [address] [phone]              Contact information for after-discharge care     Home Medical Care     [facility]) .   Service: [facility] course 74 year old M with PMH of HFrEF, OSA, DM-2, HTN, morbid obesity, stasis dermatitis presented to ED with shortness of breath and bilateral lower extremity edema, and admitted with working diagnosis of acute on chronic HFrEF.  Reports compliance with his diuretics including torsemide, Aldactone and Jardiance .  In ED, stable vitals.  No oxygen requirement.  Basic labs including CMP and CBC without significant finding.  proBNP 180.  D-dimer slightly elevated to 1.26.  CT angio chest negative for PE.  Patient was started on diuretics.  Echocardiogram ordered.   TTE with LVEF of 55 to 60%, moderate concentric LVH and indeterminate DD.  Not able to assess RV and TV.  On the day of discharge, he had net -5 L.  Breathing and edema improved and he felt well ready to go home.  He gets his care from [facility].  He is encouraged to schedule follow-up with PCP and his primary cardiologist as  soon as possible.  See individual problem list below for more.   Problems addressed during this hospitalization Acute left sided clinical Hear failure: History of HFrEF but improved EF on current TTE.  Diuresed with IV Lasix .  Breathing improved.  Difficult to assess fluid status due to body habitus and stasis dermatitis. -Discharged on p.o. Lasix , Aldactone and Jardiance . -Reassess fluid status, renal functions and electrolytes at follow-up.   Essential hypertension: -Continue Coreg , losartan , torsemide, Aldactone and Jardiance    Hyperlipidemia -Continue statins.   Controlled DM-2: A1c 6.5%. -Continue home Wegovy and Jardiance    OSA/possible OHS: Noncompliant with CPAP   History of paroxysmal atrial fibrillation: Follows at [facility].  Declined DOAC in the past. -Continue home Coreg    Normocytic anemia: Noted.   History of prostate cancer - Outpatient follow-up.  Severe morbid obesity Body mass index is 48.29 kg/m.  -Continue [phone] - Encourage lifestyle change         Consultations: None  Time spent 35  minutes  Vital signs Vitals:   07/31/24 2104 08/01/24 0500 08/01/24 0507 08/01/24 0832  BP: (!) 143/62  (!) 126/56 139/72  Pulse: 85  85 80  Temp: 99 F (37.2 C)  97.7 F (36.5 C)   Resp: 18  18   Height:      Weight:  (!) 170.6 kg    SpO2: 94%  92%   TempSrc: Oral  Oral   BMI (Calculated):  48.27  Discharge exam  GENERAL: No apparent distress.  Nontoxic. HEENT: MMM.  Vision and hearing grossly intact.  NECK: Supple.  No apparent JVD.  RESP:  No IWOB.  Fair aeration bilaterally but limited exam due to body habitus. CVS:  RRR. Heart sounds normal.  ABD/GI/GU: BS+. Abd soft, NTND.  MSK/EXT:  Moves extremities.  Bilateral stasis dermatitis. SKIN: no apparent skin lesion or wound NEURO: Awake and alert. Oriented appropriately.  No apparent focal neuro deficit. PSYCH: Calm. Normal affect.   Discharge Instructions Discharge Instructions      Discharge instructions   Complete by: As directed    It has been a pleasure taking care of you!  You were hospitalized heart failure exacerbation for which you have been treated with fluid medication.  Symptoms improved.  Continue taking medications as prescribed.  It is very important that you follow-up with your primary care doctor and cardiologist in 1 to 2 weeks or sooner if needed.  Note that clonazepam can affect your breathing. Please talk to your prescriber about this medication   Take care,   Increase activity slowly   Complete by: As directed       Allergies as of 08/01/2024       Reactions   Nsaids    Other Other (See Comments)   Non steroidal anti inflammatory analgesic - GI bleed        Medication List     STOP taking these medications    ALPRAZolam  1 MG tablet Commonly known as: XANAX        TAKE these medications    albuterol  108 (90 Base) MCG/ACT inhaler Commonly known as: VENTOLIN  HFA Inhale 1 puff into the lungs in the morning and at bedtime.   carvedilol  3.125 MG tablet Commonly known as: COREG  Take 3.125 mg by mouth 2 (two) times daily with a meal.   clonazePAM 2 MG tablet Commonly known as: KLONOPIN Take 2 mg by mouth 2 (two) times daily as needed for anxiety.   diclofenac Sodium 1 % Gel Commonly known as: VOLTAREN Apply 4 g topically 4 (four) times daily as needed.   empagliflozin  25 MG Tabs tablet Commonly known as: JARDIANCE  Take 12.5 mg by mouth daily.   latanoprost  0.005 % ophthalmic solution Commonly known as: XALATAN  Place 1 drop into both eyes at bedtime.   MENTHOL-METHYL SALICYLATE EX Apply 1 Application topically 3 (three) times daily as needed.   miconazole 2 % powder Commonly known as: MICOTIN Apply 1 Application topically as needed for itching.   omeprazole 20 MG capsule Commonly known as: PRILOSEC Take 20 mg by mouth daily.   QUEtiapine  400 MG tablet Commonly known as: SEROQUEL  Take 400 mg by mouth at  bedtime.   rosuvastatin  20 MG tablet Commonly known as: CRESTOR  Take 20 mg by mouth at bedtime.   spironolactone 25 MG tablet Commonly known as: ALDACTONE Take 25 mg by mouth daily.   torsemide 20 MG tablet Commonly known as: DEMADEX Take 20 mg by mouth daily.   triamcinolone  0.1 % cream : eucerin Crea Apply 1 Application topically daily as needed.   WEGOVY [place] Inject 0.25 mg into the skin once a week. 0.25 mg/0.375 ml         Procedures/Studies:   ECHOCARDIOGRAM COMPLETE Result Date: 07/29/2024    ECHOCARDIOGRAM REPORT   Patient Name:   Christopher Riley Date of Exam: 07/29/2024 Medical Rec #:  [id number]        Height:       74.0 in  Accession #:    [id number]       Weight:       375.0 lb Date of Birth:  03-08-1951        BSA:          2.839 m Patient Age:    73 years         BP:           125/59 mmHg Patient Gender: M                HR:           79 bpm. Exam Location:  Inpatient Procedure: 2D Echo, Color Doppler, Cardiac Doppler and Intracardiac            Opacification Agent (Both Spectral and Color Flow Doppler were            utilized during procedure). Indications:    CHF I50.21  History:        Patient has no prior history of Echocardiogram examinations.                 CHF; Risk Factors:Hypertension, Diabetes and Dyslipidemia.  Sonographer:    Tinnie Gosling RDCS Referring Phys: [id number] ROBERT DORRELL IMPRESSIONS  1. Very limited study due to poor sound transmission and patient's body habitus. Commentary on the patient's valvular and right ventricular function cannot be made by this current study.  2. Left ventricular ejection fraction, by estimation, is 55 to 60%. The left ventricle has normal function. The left ventricle has no regional wall motion abnormalities. There is moderate concentric left ventricular hypertrophy. Left ventricular diastolic function could not be evaluated.  3. Right ventricular systolic function was not well visualized. The right ventricular size is not well  visualized. Tricuspid regurgitation signal is inadequate for assessing PA pressure.  4. The mitral valve was poorly visualized prohbiting adequate assessment.  5. The tricuspid valve was poorly visualized prohibiting adequate assessment.  6. The aortic valve is tricuspid, but aortic regurgitation and stenosis could not be assessed. Comparison(s): No prior Echocardiogram. Conclusion(s)/Recommendation(s): LV systolci function is clearly preserved but this study is otherwise non-diagnostic for any right ventricular or valvular pathology. Consider obtaining a cardiac MRI or TEE if information on the right ventricle or valves  is required. FINDINGS  Left Ventricle: Left ventricular ejection fraction, by estimation, is 55 to 60%. The left ventricle has normal function. The left ventricle has no regional wall motion abnormalities. The left ventricular internal cavity size was normal in size. There is  moderate concentric left ventricular hypertrophy. Left ventricular diastolic function could not be evaluated. Right Ventricle: The right ventricular size is not well visualized. Right vetricular wall thickness was not well visualized. Right ventricular systolic function was not well visualized. Tricuspid regurgitation signal is inadequate for assessing PA pressure. Left Atrium: Left atrial size was not well visualized. Right Atrium: Right atrial size was not well visualized. Pericardium: There is no evidence of pericardial effusion. Mitral Valve: The mitral valve was poorly visualized prohbiting adequate assessment. Tricuspid Valve: The tricuspid valve was poorly visualized prohibiting adequate assessment. Aortic Valve: The aortic valve is tricuspid, but aortic regurgitation and stenosis could not be assessed. Pulmonic Valve: The pulmonic valve was not well visualized. Pulmonic valve regurgitation is not visualized. No evidence of pulmonic stenosis. Aorta: The aortic root and ascending aorta are structurally normal, with no  evidence of dilitation. Venous: The inferior vena cava was not well visualized. IAS/Shunts: The interatrial septum was not well visualized.  LEFT VENTRICLE  PLAX 2D LVIDd:         4.30 cm   Diastology LVIDs:         3.00 cm   LV e' medial:    11.40 cm/s LV PW:         1.50 cm   LV E/e' medial:  5.8 LV IVS:        1.40 cm   LV e' lateral:   6.64 cm/s LVOT diam:     2.10 cm   LV E/e' lateral: 10.0 LVOT Area:     3.46 cm  IVC IVC diam: 1.40 cm LEFT ATRIUM         Index LA diam:    4.70 cm 1.66 cm/m   AORTA Ao Root diam: 3.20 cm Ao Asc diam:  3.20 cm MITRAL VALVE MV Area (PHT): 5.24 cm    SHUNTS MV E velocity: 66.40 cm/s  Systemic Diam: 2.10 cm MV A velocity: 66.80 cm/s MV E/A ratio:  0.99 Georganna Archer Electronically signed by Georganna Archer Signature Date/Time: 07/29/2024/2:18:37 PM    Final    CT Angio Chest PE W and/or Wo Contrast Result Date: 07/29/2024 EXAM: CTA CHEST 07/29/2024 12:26:08 AM TECHNIQUE: CTA of the chest was performed without and with the administration of 75 mL of intravenous iohexol  (OMNIPAQUE ) 350 MG/ML injection. Multiplanar reformatted images are provided for review. MIP images are provided for review. Automated exposure control, iterative reconstruction, and/or weight based adjustment of the mA/kV was utilized to reduce the radiation dose to as low as reasonably achievable. COMPARISON: None available. CLINICAL HISTORY: Pulmonary embolism (PE) suspected, low to intermediate prob, positive D-dimer. FINDINGS: PULMONARY ARTERIES: Pulmonary arteries are adequately opacified for evaluation. No acute pulmonary embolus. Main pulmonary artery is normal in caliber. MEDIASTINUM: The heart and pericardium demonstrate no acute abnormality. There is no acute abnormality of the thoracic aorta. LYMPH NODES: No mediastinal, hilar or axillary lymphadenopathy. LUNGS AND PLEURA: Dependent ground glass opacities, likely dependent atelectasis. No confluent opacities. No effusions. No pneumothorax. UPPER  ABDOMEN: Limited images of the upper abdomen demonstrate a moderate-sized hiatal hernia. SOFT TISSUES AND BONES: No acute bone or soft tissue abnormality. IMPRESSION: 1. No pulmonary embolism. 2. Moderate-sized hiatal hernia. 3. No acute cardiopulmonary disease. Electronically signed by: Franky Crease MD 07/29/2024 12:29 AM EST RP Workstation: [id number]   DG Chest 2 View Result Date: 07/28/2024 CLINICAL DATA:  Shortness of breath EXAM: CHEST - 2 VIEW COMPARISON:  07/21/2024, 09/28/2012 FINDINGS: Hypoventilatory changes. Cardiomegaly with mild central congestion. Prominent appearing central pulmonary vessels. No consolidation or pneumothorax. Biapical pleural thickening is chronic. IMPRESSION: Cardiomegaly with mild central congestion. Prominent appearing central pulmonary vessels, possible pulmonary arterial hypertension. Electronically Signed   By: Luke Bun M.D.   On: 07/28/2024 16:18   DG Chest 2 View Result Date: 07/21/2024 EXAM: 2 VIEW(S) XRAY OF THE CHEST 07/21/2024 12:43:00 AM COMPARISON: Prominent date chest x ray 36 2014. CLINICAL HISTORY: FINDINGS: LUNGS AND PLEURA: Lungs are clear. No pleural effusion. No pneumothorax. HEART AND MEDIASTINUM: The aorta appears ectatic, a new finding. The heart is mildly enlarged. BONES AND SOFT TISSUES: No acute osseous abnormality. IMPRESSION: 1. No acute findings. 2. Mild cardiomegaly. 3. Ectatic aorta. Electronically signed by: Greig Pique MD 07/21/2024 12:52 AM EST RP Workstation: [id number]       The results of significant diagnostics from this hospitalization (including imaging, microbiology, ancillary and laboratory) are listed below for reference.     Microbiology: No results found for this or any previous visit (from the past  240 hours).   Labs:  CBC: Recent Labs  Lab 07/28/24 1525 07/30/24 0331  WBC 8.5 8.3  HGB 12.9* 11.8*  HCT 42.7 38.4*  MCV 87.5 84.6  PLT 300 298   BMP &GFR Recent Labs  Lab 07/28/24 1525 07/30/24 0331  07/31/24 1218 08/01/24 0422  NA 142 141 139 139  K 3.9 3.3* 3.8 3.6  CL 104 102 99 99  CO2 27 30 33* 33*  GLUCOSE 106* 92 109* 108*  BUN 15 16 15 19   CREATININE 0.83 1.01 1.10 1.29*  CALCIUM  9.1 8.7* 8.9 8.7*   Estimated Creatinine Clearance: 82.3 mL/min (A) (by C-G formula based on SCr of 1.29 mg/dL (H)). Liver & Pancreas: No results for input(s): AST, ALT, ALKPHOS, BILITOT, PROT, ALBUMIN in the last 168 hours. No results for input(s): LIPASE, AMYLASE in the last 168 hours. No results for input(s): AMMONIA in the last 168 hours. Diabetic: No results for input(s): HGBA1C in the last 72 hours. No results for input(s): GLUCAP in the last 168 hours. Cardiac Enzymes: No results for input(s): CKTOTAL, CKMB, CKMBINDEX, TROPONINI in the last 168 hours. Recent Labs    07/21/24 0039 07/28/24 1525  PROBNP 351.0* 180.0   Coagulation Profile: Recent Labs  Lab 07/30/24 0331  INR 1.1   Thyroid Function Tests: No results for input(s): TSH, T4TOTAL, FREET4, T3FREE, THYROIDAB in the last 72 hours. Lipid Profile: No results for input(s): CHOL, HDL, LDLCALC, TRIG, CHOLHDL, LDLDIRECT in the last 72 hours. Anemia Panel: No results for input(s): VITAMINB12, FOLATE, FERRITIN, TIBC, IRON, RETICCTPCT in the last 72 hours. Urine analysis:    Component Value Date/Time   COLORURINE YELLOW 06/26/2009 1405   APPEARANCEUR CLEAR 06/26/2009 1405   LABSPEC 1.024 06/26/2009 1405   LABSPEC 1.015 11/02/2007 0906   PHURINE 5.5 06/26/2009 1405   GLUCOSEU NEGATIVE 06/26/2009 1405   GLUCOSEU NEGATIVE 03/13/2008 0926   HGBUR NEGATIVE 06/26/2009 1405   BILIRUBINUR negative 02/18/2016 0911   BILIRUBINUR Negative 11/02/2007 0906   KETONESUR NEGATIVE 06/26/2009 1405   PROTEINUR positive 02/18/2016 0911   PROTEINUR NEGATIVE 06/26/2009 1405   UROBILINOGEN 0.2 02/18/2016 0911   UROBILINOGEN 0.2 06/26/2009 1405   NITRITE negative 02/18/2016 0911    NITRITE NEGATIVE 06/26/2009 1405   LEUKOCYTESUR Negative 02/18/2016 0911   LEUKOCYTESUR Negative 11/02/2007 0906   Sepsis Labs: Invalid input(s): PROCALCITONIN, LACTICIDVEN   SIGNED:  Dossie Ocanas T Minnetta Sandora, MD  [facility] 08/01/2024, 5:51 PM   "

## 2024-08-08 NOTE — Progress Notes (Signed)
 " Subjective   Patient ID:  Christopher Riley is a 74 y.o. (DOB February 19, 1951) male    Patient presents with   Follow-up CHF, leg swelling, hypertension     HPI: Christopher Riley is a 74 year old male who saw me in August.  He had a history of CHF and hypertension but seems stable.  I did not have an assessment of LV function so I ordered an echo and I asked him to come back in 6 months.  He did not have that echo done.  He had an appointment to be seen by me this morning but he could not make that.  He showed up this afternoon and we worked him in.  It seems that he has been admitted twice in the past few weeks with CHF/lower extremity edema.  He was admitted to a [facility] facility.  The echo report from January at [facility] reads technically difficult with EF 55 to 60% and moderate LVH.  The right sided chambers and valves were poorly visualized.  I cannot see the images, I only have the report.  Chest x-ray report says cardiomegaly with mild congestion and prominent pulmonary arteries.  CTA report reads no pulmonary embolus and normal PA size. Jan 7 K 3.6 and Scr 1.29--eGFR 58. January 5 Hgb 11.8 and MCV nl. Pro BNP 12/27 351 (normal less than 300).  January 3 proBNP was normal at 180.  He says he has 2 reasons for being here today.  #1 is that he ran out or lost or misplaced his torsemide  60 mg daily.  He needs a new prescription.  He says the torsemide  seems to control his lower extremity edema fairly well.  #2 reason is that someone recommended pumps for his leg swelling and he says he needs an okay from me to begin those pumps.  I asked him for a list of medications and he does not know his medications.  He does not have a list of his medications and he did not bring the bottles.  I again emphasized to him the importance of every single time he comes to the office that he must bring an accurate list of medications and ordered the bottles.  He really could not tell me any more details regarding  these pumps.  It sounds like lymphedema pumps were recommended.  While I was in the room, he actually called the representative for the pump company and handed the phone to me.  I gave them the fax number for them to fax an approval.  He seems satisfied at this point.  CHF: He was short of breath when he went to the hospital but that is essentially gone.  He had lots of lower extremity edema recently-right leg worse than left.  He has a small sore in his right posterior lower leg.  His right lower leg is somewhat tender to touch.  He is breathing comfortably at rest and his O2 sat is normal.  He has had some mild chest heaviness at times when he is short of breath.  He has had a nonproductive cough.  Hypertension: Today's pressure 122/66.  He does not seem to have a problem with his current medications.  Again, he did not bring his current medications or a list of his medications with him.  Today's BMI is 44.68.   Reviewed and updated this visit by provider: Tobacco  Allergies  Meds  Problems  Med Hx  Surg Hx  Fam Hx  Review of Systems  Constitutional:  Positive for fatigue. Negative for activity change and unexpected weight change.  Respiratory:  Positive for shortness of breath. Negative for cough.   Cardiovascular:  Positive for leg swelling. Negative for chest pain and palpitations.  Gastrointestinal:  Negative for abdominal pain, diarrhea, nausea and vomiting.  Musculoskeletal:  Positive for gait problem.       He is using a wheelchair today  Neurological:  Negative for dizziness, syncope and light-headedness.     Objective   Vitals:   08/08/24 1333  BP: 122/66  Patient Position: Sitting  Pulse: 94  Height: 6' 2 (1.88 m)  Weight: (!) 348 lb (157.9 kg)  SpO2: 97%  BMI (Calculated): 44.7  PainSc: 0-No pain     Physical Exam Vitals reviewed.  Constitutional:      General: He is not in acute distress.    Appearance: Normal appearance. He is obese. He is not  ill-appearing.  HENT:     Head: Normocephalic and atraumatic.     Nose: Nose normal.  Eyes:     General: No scleral icterus.    Conjunctiva/sclera: Conjunctivae normal.  Cardiovascular:     Rate and Rhythm: Normal rate and regular rhythm. No extrasystoles are present.    Pulses:          Radial pulses are 2+ on the right side and 2+ on the left side.     Heart sounds: Normal heart sounds, S1 normal and S2 normal. No murmur heard.    No friction rub.     Comments: Right lower leg somewhat tender to touch but there is no heat to the leg.  There is a small skin defect posterior right lower leg-no erythema or drainage. Musculoskeletal:     Right lower leg: 2+ Pitting Edema present.     Left lower leg: 2+ Pitting Edema present.  Pulmonary:     Effort: Pulmonary effort is normal. No respiratory distress.     Breath sounds: Normal breath sounds. No decreased breath sounds, wheezing, rhonchi or rales.  Abdominal:     General: Abdomen is protuberant. There is no distension.     Palpations: Abdomen is soft. There is no mass.     Tenderness: There is no abdominal tenderness. There is no guarding.  Skin:    General: Skin is warm and dry.     Coloration: Skin is not jaundiced or pale.     Findings: No bruising, erythema or rash.  Neurological:     Mental Status: He is alert and oriented to person, place, and time. Mental status is at baseline.  Psychiatric:        Mood and Affect: Mood normal.        Behavior: Behavior normal.        Thought Content: Thought content normal.        Judgment: Judgment normal.       Assessment and Plan  1. Chronic diastolic CHF (congestive heart failure) (*) (Primary) -     torsemide  (DEMADEX ) 20 mg tablet; Take three tablets (60 mg dose) by mouth daily., Starting Wed 08/08/2024, Normal 2. Primary hypertension -     torsemide  (DEMADEX ) 20 mg tablet; Take three tablets (60 mg dose) by mouth daily., Starting Wed 08/08/2024, Normal 3. Class 3 severe obesity with  serious comorbidity and body mass index (BMI) of 40.0 to 44.9 in adult, unspecified obesity type (*)    Impression: 1.  Chronic diastolic CHF.  He has had a couple  presentations to [facility] facilities recently and required diuresis.  He is better but still has chronic lower extremity edema. 2.  Hypertension fairly well-controlled on current regimen. 3.  Obesity.  This almost certainly is contributing to his lower extremity edema.  Plan: I discussed heart failure with him.  I tried my best to get an accurate list of medications but he does not have a list.  He notes his diuretic is torsemide  and that he takes 60 mg daily-he ran out or lost his torsemide  a few days ago.  1.  Resume torsemide  60 mg daily.  I sent a prescription to his pharmacy. 2.  He needs to lose weight.  He needs a low-sodium diet.  He needs to elevate his legs as possible. 3.  See PCP regularly.  Return to see me in 3 months.  I emphasized to him the importance of bringing an accurate list of medications or his actual medication bottles with him each and every time he comes here.  During this visit he let me know that another provider has prescribed what sounds like lymphedema pumps for his legs and that they need clearance from me for that.  He called a rep for the pump company and I spoke to the representative-they will be faxing what ever clearance they need to our office. Follow up in about 3 months (around 11/06/2024).    Addendum: The representative for the pump company called later and said they already had written clearance from me for him to start the lymphedema pumps.  Total time spent with patient and chart: 50 minutes  Patient's Medications  New Prescriptions   No medications on file  Previous Medications   ALBUTEROL  SULFATE HFA (PROVENTIL ,VENTOLIN ,PROAIR ) 108 (90 BASE) MCG/ACT INHALER    2 [puff]s by inhalation route.   ALPRAZOLAM  (XANAX ) 1 MG TABLET    Take one tablet (1 mg dose) by mouth.   BRIMONIDINE  (ALPHAGAN P) 0.15% OPHTHALMIC SOLUTION    brimonidine 0.15 % eye drops   CARVEDILOL  (COREG ) 3.125 MG TABLET    Take one tablet (3.125 mg dose) by mouth 2 (two) times daily with meals.   CLONAZEPAM  (KLONOPIN ) 2 MG TABLET    Take one tablet (2 mg dose) by mouth 2 (two) times daily.   DICLOFENAC SODIUM (VOLTAREN) 1 % GEL    as directed Externally two times per day; Duration: 30 days   EMPAGLIFLOZIN  (JARDIANCE ) 25 MG TABS TABLET    Take one tablet (25 mg dose) by mouth daily.   ESCITALOPRAM OXALATE (LEXAPRO) 20 MG TABLET    Take one tablet (20 mg dose) by mouth every morning.   FERROUS SULFATE 325 (65 FE) MG TABLET    FeroSul 325 mg (65 mg iron) tablet   IPRATROPIUM-ALBUTEROL  (COMBIVENT RESPIMAT) 20-100 MCG/ACT INHALER    1 [puff] by inhalation route.   LATANOPROST  (XALATAN ) 0.005% OPHTHALMIC SOLUTION    one drop.   METAMUCIL FIBER PO    daily as needed.   QUETIAPINE  FUMARATE (SEROQUEL ) 200 MG TABLET    Take one tablet (200 mg dose) by mouth at bedtime.   ROSUVASTATIN  CALCIUM  (CRESTOR ) 20 MG TABLET    Take one tablet (20 mg dose) by mouth daily.   SEMAGLUTIDE,0.25 OR 0.5 MG/DOSE, (OZEMPIC) 2 MG/3 ML SOPN PEN INJECTION    Inject into the skin once a week.   SPIRONOLACTONE  (ALDACTONE ) 25 MG TABLET    Take one tablet (25 mg dose) by mouth daily.  Modified Medications   Modified Medication Previous  Medication   TORSEMIDE  (DEMADEX ) 20 MG TABLET torsemide  (DEMADEX ) 20 mg tablet      Take three tablets (60 mg dose) by mouth daily.    Take three tablets (60 mg dose) by mouth daily.  Discontinued Medications   No medications on file      Risks, benefits, and alternatives of the medications and treatment plan prescribed today were discussed, and patient expressed understanding. Plan follow-up as discussed or as needed if any worsening symptoms or change in condition.   A yearly preventative health exam was recommended and current age based recommendations were discussed.   "

## 2024-08-13 ENCOUNTER — Encounter (HOSPITAL_COMMUNITY): Payer: Self-pay | Admitting: Internal Medicine

## 2024-08-13 ENCOUNTER — Emergency Department (HOSPITAL_COMMUNITY)

## 2024-08-13 ENCOUNTER — Inpatient Hospital Stay (HOSPITAL_COMMUNITY)
Admission: EM | Admit: 2024-08-13 | Discharge: 2024-08-23 | DRG: 871 | Disposition: A | Discharge status: Home / Self Care | Attending: Family Medicine | Admitting: Family Medicine

## 2024-08-13 ENCOUNTER — Other Ambulatory Visit: Payer: Self-pay

## 2024-08-13 DIAGNOSIS — I130 Hypertensive heart and chronic kidney disease with heart failure and stage 1 through stage 4 chronic kidney disease, or unspecified chronic kidney disease: Secondary | ICD-10-CM | POA: Diagnosis present

## 2024-08-13 DIAGNOSIS — F419 Anxiety disorder, unspecified: Secondary | ICD-10-CM | POA: Diagnosis present

## 2024-08-13 DIAGNOSIS — E875 Hyperkalemia: Secondary | ICD-10-CM | POA: Diagnosis not present

## 2024-08-13 DIAGNOSIS — Z8546 Personal history of malignant neoplasm of prostate: Secondary | ICD-10-CM

## 2024-08-13 DIAGNOSIS — M79604 Pain in right leg: Secondary | ICD-10-CM | POA: Diagnosis present

## 2024-08-13 DIAGNOSIS — F418 Other specified anxiety disorders: Secondary | ICD-10-CM

## 2024-08-13 DIAGNOSIS — L03115 Cellulitis of right lower limb: Secondary | ICD-10-CM | POA: Diagnosis present

## 2024-08-13 DIAGNOSIS — K219 Gastro-esophageal reflux disease without esophagitis: Secondary | ICD-10-CM | POA: Diagnosis present

## 2024-08-13 DIAGNOSIS — E66813 Obesity, class 3: Secondary | ICD-10-CM | POA: Diagnosis present

## 2024-08-13 DIAGNOSIS — A419 Sepsis, unspecified organism: Principal | ICD-10-CM | POA: Diagnosis present

## 2024-08-13 DIAGNOSIS — E1122 Type 2 diabetes mellitus with diabetic chronic kidney disease: Secondary | ICD-10-CM | POA: Diagnosis present

## 2024-08-13 DIAGNOSIS — N179 Acute kidney failure, unspecified: Secondary | ICD-10-CM | POA: Diagnosis not present

## 2024-08-13 DIAGNOSIS — Z7984 Long term (current) use of oral hypoglycemic drugs: Secondary | ICD-10-CM

## 2024-08-13 DIAGNOSIS — L0390 Cellulitis, unspecified: Principal | ICD-10-CM

## 2024-08-13 DIAGNOSIS — E869 Volume depletion, unspecified: Secondary | ICD-10-CM | POA: Diagnosis present

## 2024-08-13 DIAGNOSIS — Z833 Family history of diabetes mellitus: Secondary | ICD-10-CM

## 2024-08-13 DIAGNOSIS — D631 Anemia in chronic kidney disease: Secondary | ICD-10-CM | POA: Diagnosis present

## 2024-08-13 DIAGNOSIS — Z1152 Encounter for screening for COVID-19: Secondary | ICD-10-CM | POA: Diagnosis not present

## 2024-08-13 DIAGNOSIS — E785 Hyperlipidemia, unspecified: Secondary | ICD-10-CM | POA: Diagnosis present

## 2024-08-13 DIAGNOSIS — Z79899 Other long term (current) drug therapy: Secondary | ICD-10-CM

## 2024-08-13 DIAGNOSIS — Z7985 Long-term (current) use of injectable non-insulin antidiabetic drugs: Secondary | ICD-10-CM

## 2024-08-13 DIAGNOSIS — N182 Chronic kidney disease, stage 2 (mild): Secondary | ICD-10-CM | POA: Diagnosis present

## 2024-08-13 DIAGNOSIS — I5032 Chronic diastolic (congestive) heart failure: Secondary | ICD-10-CM | POA: Insufficient documentation

## 2024-08-13 DIAGNOSIS — F32A Depression, unspecified: Secondary | ICD-10-CM | POA: Diagnosis present

## 2024-08-13 DIAGNOSIS — L89323 Pressure ulcer of left buttock, stage 3: Secondary | ICD-10-CM | POA: Diagnosis present

## 2024-08-13 DIAGNOSIS — E119 Type 2 diabetes mellitus without complications: Secondary | ICD-10-CM

## 2024-08-13 DIAGNOSIS — I10 Essential (primary) hypertension: Secondary | ICD-10-CM | POA: Diagnosis present

## 2024-08-13 DIAGNOSIS — I5033 Acute on chronic diastolic (congestive) heart failure: Secondary | ICD-10-CM | POA: Diagnosis not present

## 2024-08-13 DIAGNOSIS — K5900 Constipation, unspecified: Secondary | ICD-10-CM | POA: Diagnosis not present

## 2024-08-13 DIAGNOSIS — M7989 Other specified soft tissue disorders: Secondary | ICD-10-CM | POA: Diagnosis not present

## 2024-08-13 DIAGNOSIS — E1142 Type 2 diabetes mellitus with diabetic polyneuropathy: Secondary | ICD-10-CM | POA: Diagnosis present

## 2024-08-13 DIAGNOSIS — Z96651 Presence of right artificial knee joint: Secondary | ICD-10-CM | POA: Diagnosis present

## 2024-08-13 DIAGNOSIS — T500X5A Adverse effect of mineralocorticoids and their antagonists, initial encounter: Secondary | ICD-10-CM | POA: Diagnosis not present

## 2024-08-13 DIAGNOSIS — I480 Paroxysmal atrial fibrillation: Secondary | ICD-10-CM | POA: Diagnosis present

## 2024-08-13 DIAGNOSIS — G4733 Obstructive sleep apnea (adult) (pediatric): Secondary | ICD-10-CM | POA: Diagnosis present

## 2024-08-13 DIAGNOSIS — H409 Unspecified glaucoma: Secondary | ICD-10-CM | POA: Diagnosis present

## 2024-08-13 DIAGNOSIS — E8809 Other disorders of plasma-protein metabolism, not elsewhere classified: Secondary | ICD-10-CM | POA: Diagnosis not present

## 2024-08-13 DIAGNOSIS — Z6841 Body Mass Index (BMI) 40.0 and over, adult: Secondary | ICD-10-CM | POA: Diagnosis not present

## 2024-08-13 DIAGNOSIS — Z860101 Personal history of adenomatous and serrated colon polyps: Secondary | ICD-10-CM

## 2024-08-13 DIAGNOSIS — Z8711 Personal history of peptic ulcer disease: Secondary | ICD-10-CM

## 2024-08-13 DIAGNOSIS — N400 Enlarged prostate without lower urinary tract symptoms: Secondary | ICD-10-CM | POA: Diagnosis present

## 2024-08-13 LAB — CBC
HCT: 38.3 % — ABNORMAL LOW (ref 39.0–52.0)
Hemoglobin: 12.1 g/dL — ABNORMAL LOW (ref 13.0–17.0)
MCH: 26.9 pg (ref 26.0–34.0)
MCHC: 31.6 g/dL (ref 30.0–36.0)
MCV: 85.1 fL (ref 80.0–100.0)
Platelets: 254 K/uL (ref 150–400)
RBC: 4.5 MIL/uL (ref 4.22–5.81)
RDW: 14.8 % (ref 11.5–15.5)
WBC: 12.4 K/uL — ABNORMAL HIGH (ref 4.0–10.5)
nRBC: 0 % (ref 0.0–0.2)

## 2024-08-13 LAB — COMPREHENSIVE METABOLIC PANEL WITH GFR
ALT: 16 U/L (ref 0–44)
AST: 20 U/L (ref 15–41)
Albumin: 3.9 g/dL (ref 3.5–5.0)
Alkaline Phosphatase: 72 U/L (ref 38–126)
Anion gap: 7 (ref 5–15)
BUN: 14 mg/dL (ref 8–23)
CO2: 32 mmol/L (ref 22–32)
Calcium: 9.2 mg/dL (ref 8.9–10.3)
Chloride: 102 mmol/L (ref 98–111)
Creatinine, Ser: 1.11 mg/dL (ref 0.61–1.24)
GFR, Estimated: >60 mL/min
Glucose, Bld: 113 mg/dL — ABNORMAL HIGH (ref 70–99)
Potassium: 4 mmol/L (ref 3.5–5.1)
Sodium: 141 mmol/L (ref 135–145)
Total Bilirubin: 0.4 mg/dL (ref 0.0–1.2)
Total Protein: 7.2 g/dL (ref 6.5–8.1)

## 2024-08-13 LAB — BASIC METABOLIC PANEL WITH GFR
Anion gap: 8 (ref 5–15)
BUN: 16 mg/dL (ref 8–23)
CO2: 29 mmol/L (ref 22–32)
Calcium: 8.8 mg/dL — ABNORMAL LOW (ref 8.9–10.3)
Chloride: 102 mmol/L (ref 98–111)
Creatinine, Ser: 1.05 mg/dL (ref 0.61–1.24)
GFR, Estimated: >60 mL/min
Glucose, Bld: 112 mg/dL — ABNORMAL HIGH (ref 70–99)
Potassium: 4.5 mmol/L (ref 3.5–5.1)
Sodium: 139 mmol/L (ref 135–145)

## 2024-08-13 LAB — GLUCOSE, CAPILLARY
Glucose-Capillary: 112 mg/dL — ABNORMAL HIGH (ref 70–99)
Glucose-Capillary: 126 mg/dL — ABNORMAL HIGH (ref 70–99)

## 2024-08-13 LAB — CBG MONITORING, ED
Glucose-Capillary: 107 mg/dL — ABNORMAL HIGH (ref 70–99)
Glucose-Capillary: 112 mg/dL — ABNORMAL HIGH (ref 70–99)

## 2024-08-13 LAB — RESP PANEL BY RT-PCR (RSV, FLU A&B, COVID)  RVPGX2
Influenza A by PCR: NEGATIVE
Influenza B by PCR: NEGATIVE
Resp Syncytial Virus by PCR: NEGATIVE
SARS Coronavirus 2 by RT PCR: NEGATIVE

## 2024-08-13 LAB — HEMOGLOBIN A1C
Hgb A1c MFr Bld: 6.6 % — ABNORMAL HIGH (ref 4.8–5.6)
Mean Plasma Glucose: 142.72 mg/dL

## 2024-08-13 LAB — LACTIC ACID, PLASMA: Lactic Acid, Venous: 0.6 mmol/L (ref 0.5–1.9)

## 2024-08-13 MED ORDER — VANCOMYCIN HCL 10 G IV SOLR
2500.0000 mg | INTRAVENOUS | Status: DC
  Administered 2024-08-14: 2500 mg via INTRAVENOUS
  Filled 2024-08-13 (×4): qty 25

## 2024-08-13 MED ORDER — ROSUVASTATIN CALCIUM 20 MG PO TABS
20.0000 mg | ORAL_TABLET | Freq: Every day | ORAL | Status: DC
  Administered 2024-08-13 – 2024-08-22 (×10): 20 mg via ORAL
  Filled 2024-08-13 (×10): qty 1

## 2024-08-13 MED ORDER — LATANOPROST 0.005 % OP SOLN
1.0000 [drp] | Freq: Every day | OPHTHALMIC | Status: DC
  Administered 2024-08-13 – 2024-08-22 (×10): 1 [drp] via OPHTHALMIC
  Filled 2024-08-13: qty 2.5

## 2024-08-13 MED ORDER — ACETAMINOPHEN 325 MG PO TABS
650.0000 mg | ORAL_TABLET | Freq: Four times a day (QID) | ORAL | Status: DC | PRN
  Administered 2024-08-13 – 2024-08-21 (×7): 650 mg via ORAL
  Filled 2024-08-13 (×7): qty 2

## 2024-08-13 MED ORDER — EMPAGLIFLOZIN 10 MG PO TABS
10.0000 mg | ORAL_TABLET | Freq: Every day | ORAL | Status: DC
  Administered 2024-08-14 – 2024-08-23 (×10): 10 mg via ORAL
  Filled 2024-08-13 (×11): qty 1

## 2024-08-13 MED ORDER — QUETIAPINE FUMARATE 200 MG PO TABS
400.0000 mg | ORAL_TABLET | Freq: Every day | ORAL | Status: DC
  Administered 2024-08-13 – 2024-08-22 (×10): 400 mg via ORAL
  Filled 2024-08-13: qty 2
  Filled 2024-08-13 (×2): qty 8
  Filled 2024-08-13 (×6): qty 2
  Filled 2024-08-13 (×2): qty 8
  Filled 2024-08-13: qty 2
  Filled 2024-08-13 (×2): qty 8
  Filled 2024-08-13: qty 2
  Filled 2024-08-13 (×2): qty 8
  Filled 2024-08-13 (×2): qty 2

## 2024-08-13 MED ORDER — ENOXAPARIN SODIUM 80 MG/0.8ML IJ SOSY
80.0000 mg | PREFILLED_SYRINGE | INTRAMUSCULAR | Status: DC
  Administered 2024-08-13 – 2024-08-22 (×10): 80 mg via SUBCUTANEOUS
  Filled 2024-08-13 (×10): qty 0.8

## 2024-08-13 MED ORDER — PANTOPRAZOLE SODIUM 40 MG PO TBEC
40.0000 mg | DELAYED_RELEASE_TABLET | Freq: Every day | ORAL | Status: DC
  Administered 2024-08-14 – 2024-08-23 (×10): 40 mg via ORAL
  Filled 2024-08-13 (×10): qty 1

## 2024-08-13 MED ORDER — HYDROMORPHONE HCL 1 MG/ML IJ SOLN
1.0000 mg | Freq: Once | INTRAMUSCULAR | Status: AC
  Administered 2024-08-13: 1 mg via INTRAVENOUS
  Filled 2024-08-13: qty 1

## 2024-08-13 MED ORDER — ONDANSETRON HCL 4 MG/2ML IJ SOLN
4.0000 mg | Freq: Once | INTRAMUSCULAR | Status: AC
  Administered 2024-08-13: 4 mg via INTRAVENOUS
  Filled 2024-08-13: qty 2

## 2024-08-13 MED ORDER — ONDANSETRON HCL 4 MG/2ML IJ SOLN: 4.0000 mg | Freq: Four times a day (QID) | INTRAMUSCULAR | Status: DC | PRN

## 2024-08-13 MED ORDER — INSULIN ASPART 100 UNIT/ML IJ SOLN: 0.0000 [IU] | Freq: Every day | INTRAMUSCULAR | Status: DC

## 2024-08-13 MED ORDER — SPIRONOLACTONE 25 MG PO TABS
25.0000 mg | ORAL_TABLET | Freq: Every day | ORAL | Status: DC
  Administered 2024-08-13 – 2024-08-23 (×11): 25 mg via ORAL
  Filled 2024-08-13 (×11): qty 1

## 2024-08-13 MED ORDER — ALBUTEROL SULFATE (2.5 MG/3ML) 0.083% IN NEBU
2.5000 mg | INHALATION_SOLUTION | RESPIRATORY_TRACT | Status: DC | PRN
  Administered 2024-08-19: 2.5 mg via RESPIRATORY_TRACT
  Filled 2024-08-13: qty 3

## 2024-08-13 MED ORDER — ONDANSETRON HCL 4 MG PO TABS: 4.0000 mg | ORAL_TABLET | Freq: Four times a day (QID) | ORAL | Status: DC | PRN

## 2024-08-13 MED ORDER — KETOROLAC TROMETHAMINE 15 MG/ML IJ SOLN
15.0000 mg | Freq: Once | INTRAMUSCULAR | Status: AC
  Administered 2024-08-13: 15 mg via INTRAVENOUS
  Filled 2024-08-13: qty 1

## 2024-08-13 MED ORDER — CARVEDILOL 3.125 MG PO TABS
3.1250 mg | ORAL_TABLET | Freq: Two times a day (BID) | ORAL | Status: DC
  Administered 2024-08-13 – 2024-08-23 (×20): 3.125 mg via ORAL
  Filled 2024-08-13 (×20): qty 1

## 2024-08-13 MED ORDER — VANCOMYCIN HCL 10 G IV SOLR
2500.0000 mg | Freq: Once | INTRAVENOUS | Status: AC
  Administered 2024-08-13: 2500 mg via INTRAVENOUS
  Filled 2024-08-13: qty 50

## 2024-08-13 MED ORDER — SODIUM CHLORIDE 0.9 % IV SOLN
2.0000 g | INTRAVENOUS | Status: DC
  Administered 2024-08-14 – 2024-08-15 (×2): 2 g via INTRAVENOUS
  Filled 2024-08-13 (×2): qty 20

## 2024-08-13 MED ORDER — INSULIN ASPART 100 UNIT/ML IJ SOLN
0.0000 [IU] | Freq: Three times a day (TID) | INTRAMUSCULAR | Status: DC
  Administered 2024-08-17: 2 [IU] via SUBCUTANEOUS
  Administered 2024-08-18: 3 [IU] via SUBCUTANEOUS
  Administered 2024-08-19 – 2024-08-20 (×2): 2 [IU] via SUBCUTANEOUS
  Filled 2024-08-13: qty 2
  Filled 2024-08-13: qty 3
  Filled 2024-08-13 (×3): qty 2

## 2024-08-13 MED ORDER — CLONAZEPAM 0.5 MG PO TABS
2.0000 mg | ORAL_TABLET | Freq: Two times a day (BID) | ORAL | Status: DC | PRN
  Administered 2024-08-14 – 2024-08-21 (×3): 2 mg via ORAL
  Filled 2024-08-13 (×3): qty 4

## 2024-08-13 MED ORDER — SODIUM CHLORIDE 0.9 % IV SOLN
2.0000 g | Freq: Once | INTRAVENOUS | Status: AC
  Administered 2024-08-13: 2 g via INTRAVENOUS
  Filled 2024-08-13: qty 20

## 2024-08-13 MED ORDER — IOHEXOL 350 MG/ML SOLN
100.0000 mL | Freq: Once | INTRAVENOUS | Status: AC | PRN
  Administered 2024-08-13: 100 mL via INTRAVENOUS

## 2024-08-13 MED ORDER — TORSEMIDE 20 MG PO TABS
20.0000 mg | ORAL_TABLET | Freq: Every day | ORAL | Status: DC
  Administered 2024-08-14 – 2024-08-22 (×9): 20 mg via ORAL
  Filled 2024-08-13 (×10): qty 1

## 2024-08-13 MED ORDER — ACETAMINOPHEN 650 MG RE SUPP: 650.0000 mg | Freq: Four times a day (QID) | RECTAL | Status: DC | PRN
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
## 2024-08-13 NOTE — ED Notes (Signed)
 Transferred patient from ER stretcher to hospital bed for patient comfort. Repositioned patient. Tolerated well. Pt able to take a few steps using his walker. Personal belongings, call light, and urinal within reach. Pt denies further needs at this time.

## 2024-08-13 NOTE — Progress Notes (Signed)
 Pharmacy Note   A consult was received from an ED physician for vancomycin  per pharmacy dosing.    The patient's profile has been reviewed for ht/wt/allergies/indication/available labs.    A one time order has been placed for vancomycin  2500 mg IV x1 .    Further antibiotics/pharmacy consults should be ordered by admitting physician if indicated.                       Thank you,  Baylei Siebels, PharmD, BCPS 08/13/2024 7:48 AM

## 2024-08-13 NOTE — Progress Notes (Signed)
 Pharmacy Antibiotic Note  Christopher Riley is a 74 y.o. male admitted on 08/13/2024 with cellulitis.  Pharmacy has been consulted for vancomycin  dosing.  Plan: Vancomycin  2500 mg IV q24 for eAUC 489.4 using SCr 1.11; VD 0.5 Ceftriaxone  2 gm IV q24 per MD    Temp (24hrs), Avg:99.6 F (37.6 C), Min:99.6 F (37.6 C), Max:99.6 F (37.6 C)  Recent Labs  Lab 08/13/24 0520  WBC 12.4*  CREATININE 1.11    Estimated Creatinine Clearance: 95.6 mL/min (by C-G formula based on SCr of 1.11 mg/dL).    Allergies[1]  Antimicrobials this admission: 1/19 vanc >> 1/19 ceftriaxone  Dose adjustments this admission:  Microbiology results: 1/19 BCx2:    Thank you for allowing pharmacy to be a part of this patients care.   Christopher Riley, Pharm.D Use secure chat for questions 08/13/2024 8:24 AM     [1]  Allergies Allergen Reactions   Nsaids    Other Other (See Comments)    Non steroidal anti inflammatory analgesic - GI bleed

## 2024-08-13 NOTE — ED Triage Notes (Signed)
 Pt BIB [facility] from home. Pt c/o leg pain. Pt reports having bleeding boil on left butt cheek. Aox4. Hx CHF, Knee replacement  148/72 110HR 98% RA

## 2024-08-14 DIAGNOSIS — I480 Paroxysmal atrial fibrillation: Secondary | ICD-10-CM

## 2024-08-14 DIAGNOSIS — E785 Hyperlipidemia, unspecified: Secondary | ICD-10-CM

## 2024-08-14 DIAGNOSIS — E66813 Obesity, class 3: Secondary | ICD-10-CM

## 2024-08-14 DIAGNOSIS — I10 Essential (primary) hypertension: Secondary | ICD-10-CM

## 2024-08-14 DIAGNOSIS — F418 Other specified anxiety disorders: Secondary | ICD-10-CM

## 2024-08-14 DIAGNOSIS — E119 Type 2 diabetes mellitus without complications: Secondary | ICD-10-CM

## 2024-08-14 DIAGNOSIS — G4733 Obstructive sleep apnea (adult) (pediatric): Secondary | ICD-10-CM

## 2024-08-14 DIAGNOSIS — I5032 Chronic diastolic (congestive) heart failure: Secondary | ICD-10-CM

## 2024-08-14 DIAGNOSIS — K219 Gastro-esophageal reflux disease without esophagitis: Secondary | ICD-10-CM

## 2024-08-14 LAB — CBC WITH DIFFERENTIAL/PLATELET
Blasts: 0 % (ref 0.0–0.1)
HCT: 38.3 % — ABNORMAL LOW (ref 39.0–52.0)
Hemoglobin: 11.9 g/dL — ABNORMAL LOW (ref 13.0–17.0)
Lymphocytes Relative: 3 %
Lymphs Abs: 0 % (ref 0.7–4.0)
MCH: 26.6 pg (ref 26.0–34.0)
MCHC: 31.1 g/dL (ref 30.0–36.0)
MCV: 85.5 fL (ref 80.0–100.0)
Metamyelocytes Relative: 0 %
Metamyelocytes Relative: 0.4 % (ref 0.0–0.5)
Monocytes Absolute: 0 % (ref 0.1–1.0)
Monocytes Relative: 0 %
Monocytes Relative: 10 %
Myelocytes: 0 % (ref 0.0–0.1)
Myelocytes: 0 % (ref 0.0–0.5)
Neutro Abs: 9 % (ref 1.7–7.7)
Neutrophils Relative %: 78 %
Other: 1.1 % (ref 0.7–4.0)
Platelets: 234 K/uL (ref 150–400)
Promyelocytes Relative: 0 %
Promyelocytes Relative: 0 % (ref 0.00–0.07)
RBC Morphology: 0 %
RBC: 4.48 MIL/uL (ref 4.22–5.81)
RDW: 14.8 % (ref 11.5–15.5)
Smear Review: 9.4 — AB (ref 1.7–7.7)
Smear Review: NORMAL (ref 0.00–0.07)
WBC: 12.1 K/uL — ABNORMAL HIGH (ref 4.0–10.5)
nRBC: 0 % (ref 0.0–0.2)
nRBC: 0 /100{WBCs}
nRBC: 1.2 /100{WBCs} — AB (ref 0.1–1.0)

## 2024-08-14 LAB — BASIC METABOLIC PANEL WITH GFR
Anion gap: 10 (ref 5–15)
BUN: 20 mg/dL (ref 8–23)
CO2: 29 mmol/L (ref 22–32)
Calcium: 8.7 mg/dL — ABNORMAL LOW (ref 8.9–10.3)
Chloride: 100 mmol/L (ref 98–111)
Creatinine, Ser: 1.29 mg/dL — ABNORMAL HIGH (ref 0.61–1.24)
GFR, Estimated: 58 mL/min — ABNORMAL LOW
Glucose, Bld: 115 mg/dL — ABNORMAL HIGH (ref 70–99)
Potassium: 4.4 mmol/L (ref 3.5–5.1)
Sodium: 138 mmol/L (ref 135–145)

## 2024-08-14 LAB — C-REACTIVE PROTEIN: CRP: 8.3 mg/dL — ABNORMAL HIGH

## 2024-08-14 LAB — CBC
HCT: 37.7 % — ABNORMAL LOW (ref 39.0–52.0)
Hemoglobin: 11.4 g/dL — ABNORMAL LOW (ref 13.0–17.0)
MCH: 26 pg (ref 26.0–34.0)
MCHC: 30.2 g/dL (ref 30.0–36.0)
MCV: 85.9 fL (ref 80.0–100.0)
Platelets: 250 K/uL (ref 150–400)
RBC: 4.39 MIL/uL (ref 4.22–5.81)
RDW: 14.5 % (ref 11.5–15.5)
WBC: 12.6 K/uL — ABNORMAL HIGH (ref 4.0–10.5)
nRBC: 0 % (ref 0.0–0.2)

## 2024-08-14 LAB — GLUCOSE, CAPILLARY
Glucose-Capillary: 76 mg/dL (ref 70–99)
Glucose-Capillary: 105 mg/dL — ABNORMAL HIGH (ref 70–99)
Glucose-Capillary: 124 mg/dL — ABNORMAL HIGH (ref 70–99)

## 2024-08-14 NOTE — TOC Initial Note (Signed)
 Transition of Care [facility]) - Initial/Assessment Note    Patient Details  Name: Christopher Riley MRN: [id number] Date of Birth: 10/15/1950  [facility]) CM/SW Contact:    [name], RN Phone Number: 08/14/2024, 3:04 PM  Clinical Narrative:  Admitted from home, presented to ED with c/o right leg pain, discharged from the hospital on 1/7 after a stay for acute left-sided heart failure. Resides in a private residence, PCP and insurance on file. CM will follow for dc needs.         Patient Goals and CMS Choice            Expected Discharge Plan and Services                                              Prior Living Arrangements/Services                       Activities of Daily Living   ADL Screening (condition at time of admission) Independently performs ADLs?: Yes (appropriate for developmental age) Is the patient deaf or have difficulty hearing?: No Does the patient have difficulty seeing, even when wearing glasses/contacts?: No Does the patient have difficulty concentrating, remembering, or making decisions?: No  Permission Sought/Granted                  Emotional Assessment              Admission diagnosis:  Sepsis due to cellulitis (HCC) [L03.90, A41.9] Cellulitis, unspecified cellulitis site [L03.90] Patient Active Problem List   Diagnosis Date Noted   Depression with anxiety 08/14/2024   Chronic heart failure with preserved ejection fraction (HFpEF) (HCC)    GERD (gastroesophageal reflux disease)    Paroxysmal atrial fibrillation (HCC)    Sepsis due to cellulitis (HCC) 08/13/2024   Acute clinical systolic heart failure (HCC) 07/29/2024   Normocytic anemia 07/29/2024   Obesity, Class III, BMI 40-49.9 (morbid obesity) (HCC) 03/20/2021   Morbid obesity (HCC) 03/20/2021   Xerosis cutis 03/20/2021   Asymptomatic varicose veins 01/23/2021   Barrett's esophagus 01/23/2021   Bilateral primary osteoarthritis of knee  01/23/2021   Cataract 01/23/2021   Dependence on continuous positive airway pressure ventilation 01/23/2021   Difficulty in walking, not elsewhere classified 01/23/2021   Disorder of the skin and subcutaneous tissue, unspecified 01/23/2021   Elevated prostate specific antigen (PSA) 01/23/2021   Encounter for dental examination 01/23/2021   Enthesopathy of ankle and tarsus 01/23/2021   First degree hemorrhoids 01/23/2021   Gastritis and gastroduodenitis with hemorrhage 01/23/2021   Generalized osteoarthrosis, involving multiple sites 01/23/2021   Hemorrhage of anus and rectum 01/23/2021   History of falling 01/23/2021   Hypersomnia with sleep apnea 01/23/2021   Lymphedema, not elsewhere classified 01/23/2021   Microscopic hematuria 01/23/2021   Muscle weakness (generalized) 01/23/2021   Other abnormalities of gait and mobility 01/23/2021   Other joint derangement not elsewhere classified of lower leg 01/23/2021   Other reduced mobility 01/23/2021   Pain in right knee 01/23/2021   Other chronic pain 01/23/2021   Polyp of colon 01/23/2021   Presbyopia 01/23/2021   Presence of right artificial knee joint 01/23/2021   Primary open-angle glaucoma, right eye, severe stage 01/23/2021   Problem related to unspecified psychosocial circumstances 01/23/2021   Type 2 diabetes mellitus with hyperglycemia (HCC) 01/23/2021  Type 2 diabetes mellitus (HCC) 01/23/2021   Urinary incontinence 01/23/2021   Calculus of kidney 01/23/2021   Plantar flexed metatarsal bone of left foot 01/23/2021   Intertrigo 08/03/2016   History of nephrolithiasis 02/18/2016   Scrotal swelling 06/27/2013   Chest pain 09/27/2012   Fatigue 08/15/2012   Neuropathy 03/22/2011   WEAKNESS 06/30/2010   ANKLE PAIN, RIGHT 04/02/2010   CONSTIPATION 08/28/2009   ANEMIA, SECONDARY TO CHRONIC BLOOD LOSS 08/21/2009   HEMORRHOIDS, WITH BLEEDING 07/28/2009   NECK PAIN 02/19/2009   Hypertension 01/16/2009   SOMNOLENCE 01/16/2009    OSA (obstructive sleep apnea) 01/16/2009   SHOULDER PAIN, RIGHT 07/12/2008   Dyslipidemia 03/22/2008   LOW BACK PAIN, CHRONIC 03/22/2008   ADENOCARCINOMA, PROSTATE 03/12/2008   OTHER ACUTE REACTIONS TO STRESS 03/12/2008   MEMORY LOSS 03/12/2008   PARESTHESIA 03/12/2008   History of prostate cancer 03/12/2008   PCP:  Claude Reagin, NP Pharmacy:   [id number] [address]), [place] - [address] [place] ([place]) [place] [address] Phone: [phone] Fax: [phone]  [facility] - [place], [place] - [address] [place] [address] Phone: [phone] Fax: [phone]  [facility] - [facility] [address] [place] [address] Phone: [phone] Fax: [phone]     Social Drivers of Health (SDOH) Social History: SDOH Screenings   Food Insecurity: No Food Insecurity (08/13/2024)  Housing: Low Risk (08/13/2024)  Transportation Needs: Unmet Transportation Needs (08/13/2024)  Utilities: Not At Risk (08/13/2024)  Social Connections: Socially Isolated (08/13/2024)  Tobacco Use: Low Risk (08/13/2024)   SDOH Interventions:     Readmission Risk Interventions     No data to display

## 2024-08-14 NOTE — Progress Notes (Signed)
" °   08/14/24 0011  BiPAP/CPAP/SIPAP  $ Non-Invasive Home Ventilator  Initial  $ Face Mask Large  Yes  BiPAP/CPAP/SIPAP Pt Type Adult  BiPAP/CPAP/SIPAP DREAMSTATIOND  Mask Type Full face mask  Dentures removed? Not applicable  Mask Size Large  Respiratory Rate 18 breaths/min  Flow Rate 2 lpm  Patient Home Machine No  Patient Home Mask No  Patient Home Tubing No  Auto Titrate Yes  Minimum cmH2O 5 cmH2O  Maximum cmH2O 20 cmH2O  Device Plugged into RED Power Outlet Yes    "

## 2024-08-14 NOTE — Progress Notes (Signed)
 Pt's vital signs @1917  was as follows: temp 102.1 oral, BP 184/64, MAP-90, pulse-91, respirations 18 and unlabored, sats on o2@2lpm  via [place] is 98% and stable. Status changed to YELLOW MEWS. Messaged Isaiah Lever, NP of the above findings and that he recently received the Coreg  dose that was due at 1700 and that Tylenol  was given. Nurse had to notify this same provider to update with the pt having a temp of 103 oral and BP 153/60 and that the pt was very warm to touch, ice packs would be applied. She entered new order for Toradol . This was administered as well as the collection of respiratory swab for testing as ordered by provider. Nurse also informed Rapid nurse of the above findings as well. See flow sheet under ME[facility], in addition.

## 2024-08-14 NOTE — Assessment & Plan Note (Addendum)
 Patient met sepsis criteria with fever, leukocytosis and tachycardia.  Likely secondary to right lower extremity cellulitis as evident on imaging.  Preliminary blood cultures negative. - Continue with ceftriaxone  and vancomycin  -Continue with supportive care -PT/OT evaluation ordered - Right lower extremity venous Doppler ordered

## 2024-08-14 NOTE — Progress Notes (Signed)
 " Progress Note   Patient: Christopher Riley FMW:[id number] DOB: Aug 26, 1950 DOA: 08/13/2024     1 DOS: the patient was seen and examined on 08/14/2024   Brief hospital course: Partly taken from prior notes.   Christopher Riley is a 74 y.o. male with medical history significant for heart failure with reduced EF not recovered, DM2, hypertension, OSA, morbid obesity and stasis dermatitis with sacral decubitus ulcer being admitted to the hospital with leg pain due to right lower extremity cellulitis.  Patient was just discharged from the hospital on 1/7 after a stay for acute left-sided heart failure, diuresed successfully and discharged back home.   Workup in ED with leukocytosis, CT scan consistent with leg edema and cellulitis.  Blood cultures were drawn and patient was started on Rocephin  and vancomycin .  1/20: Patient became febrile last night up to 103, preliminary blood cultures negative.  CTA abdomen was negative for any significant stenosis.  More consistent with cellulitis and inguinal lymphadenopathy.  Assessment and Plan: * Sepsis due to cellulitis Christopher Riley) Patient met sepsis criteria with fever, leukocytosis and tachycardia.  Likely secondary to right lower extremity cellulitis as evident on imaging.  Preliminary blood cultures negative. - Continue with ceftriaxone  and vancomycin  -Continue with supportive care -PT/OT evaluation ordered - Right lower extremity venous Doppler ordered  Chronic heart failure with preserved ejection fraction (HFpEF) (HCC) Recent echo with normal EF and indeterminate diastolic function.  Recent admission secondary to CHF exacerbation. - Avoid excessive IV fluid -Continue home Coreg , Crestor , Aldactone  and torsemide   Hypertension Blood pressure currently within goal. -Continuing home Coreg , Aldactone  and torsemide   Paroxysmal atrial fibrillation (HCC) Per documentation patient has declined DOAC in the past.  Also has history of GI bleed. - Continue with  Coreg   Type 2 diabetes mellitus (HCC) Seems well-controlled with CBG within goal and most recent A1c of 6.5. - Continue with SSI  Dyslipidemia - Continue with home Crestor   Depression with anxiety - Continue home Seroquel  and as needed clonazepam   GERD (gastroesophageal reflux disease) - Continue PPI  OSA (obstructive sleep apnea) - CPAP at night  Obesity, Class III, BMI 40-49.9 (morbid obesity) (HCC) Estimated body mass index is 48.29 kg/m as calculated from the following:   Height as of 07/28/24: 6' 2 (1.88 m).   Weight as of 08/01/24: 170.6 kg.   - This will complicate overall prognosis.   Subjective: Patient was seen and examined today.  Still having some pain involving right lower extremity.  Stating overall little better than before.  Physical Exam: Vitals:   08/14/24 0225 08/14/24 0232 08/14/24 0512 08/14/24 0736  BP: 124/67  (!) 118/53 135/72  Pulse: 79 73 76 80  Resp: 16  19 18   Temp: 99 F (37.2 C)  97.8 F (36.6 C) 99.1 F (37.3 C)  TempSrc: Axillary  Oral Oral  SpO2: 90% 96% 96% 95%   General.  Morbidly obese gentleman, in no acute distress. Pulmonary.  Lungs clear bilaterally, normal respiratory effort. CV.  Regular rate and rhythm, no JVD, rub or murmur. Abdomen.  Soft, nontender, nondistended, BS positive. CNS.  Alert and oriented .  No focal neurologic deficit. Extremities.  Significant edema and erythema of right lower extremity, pulses intact and symmetrical. Psychiatry.  Judgment and insight appears normal.   Data Reviewed: Prior data reviewed  Family Communication: Discussed with patient.  Disposition: Status is: Inpatient Remains inpatient appropriate because: Severity of illness  Planned Discharge Destination: Home  DVT prophylaxis.  Lovenox  Time spent: 50 minutes  This record has been created using Conservation officer, historic buildings. Errors have been sought and corrected,but may not always be located. Such creation errors do not  reflect on the standard of care.   Author: Amaryllis Dare, MD 08/14/2024 1:45 PM  For on call review [email or web address].  "

## 2024-08-14 NOTE — Assessment & Plan Note (Signed)
 Recent echo with normal EF and indeterminate diastolic function.  Recent admission secondary to CHF exacerbation. - Avoid excessive IV fluid -Continue home Coreg , Crestor , Aldactone  and torsemide 

## 2024-08-14 NOTE — Assessment & Plan Note (Signed)
 Estimated body mass index is 48.29 kg/m as calculated from the following:   Height as of 07/28/24: 6' 2 (1.88 m).   Weight as of 08/01/24: 170.6 kg.   - This will complicate overall prognosis.

## 2024-08-14 NOTE — Assessment & Plan Note (Signed)
 Blood pressure currently within goal. -Continuing home Coreg , Aldactone  and torsemide 

## 2024-08-14 NOTE — Hospital Course (Addendum)
 Christopher Riley is a 74 y.o. male with medical history significant for heart failure with reduced EF not recovered, DM2, hypertension, OSA, morbid obesity and stasis dermatitis with sacral decubitus ulcer being admitted to the hospital with leg pain with wound and was found to have a right leg cellulitis.  Subsequently he also had acute on chronic CHF.  He was given diuresis and placed on antibiotics and improved significantly.  He continued be extremely weak and initially declined SNF but now agreeable but currently has no bed offers.  Assessment and Plan:  Sepsis due to Cellulitis [facility]): Improving. Patient met sepsis criteria with fever, leukocytosis and tachycardia.  Likely secondary to right lower extremity cellulitis as evident on imaging.  Preliminary blood cultures negative. Continued with IV ceftriaxone  and vancomycin  but will change to Cefazolin  and Linezolid ; Leg has improved and not as erythematous; Will complete 10 days of Tx -Will need to be OOB and Elevate Extremity  -WBC resolved and last check was 9.4 -Continue with supportive care -PT/OT evaluation ordered and recommending SNF and he was refusing at this time however it subsequently changed his mind after reassessment today.  Willing to go to SNF for rehabilitation -Right lower extremity venous Doppler ordered and showed no DVT -He is Medically Stable for Discharge.   Acute on Chronic Heart Failure with preserved ejection fraction (HFpEF) (HCC) Recent echo with normal EF and indeterminate diastolic function.  Recent admission secondary to CHF Exacerbation. -Avoid excessive IV fluid -Pro-BNP is now 73.9 -Continue home Coreg , Crestor , Aldactone  and torsemide  and Jardiance . Strict I's and O's and daily Weights.  Stop the IV furosemide  that he was getting and discontinuing the p.o. torsemide  10 mg daily.  He received IV Furosemide  40 mg twice daily x 4 days and now appears to be very euvolemic so holding further IV  Diuresis.Intake/Output Summary (Last 24 hours) at 08/21/2024 1421 Last data filed at 08/21/2024 1326 Gross per 24 hour  Intake 2040 ml  Output 3350 ml  Net -1310 ml  -CXR as below  Dyspnea and Wheezing: Improving respiratory Status. Repeat CXR 1/25 showed Enlargement of the cardiopericardial silhouette with pulmonary vascular congestion. -Add DuoNebs scheduled q6h, Flutter Valve, Incentive Spirometry, and Guaifenesin  1200 mg po BID; C/w Albuterol  Nebs q2hprn -C/w DreamStation qHS -Repeat CXR intermittently   Constipation: She has bowel regimen with Senna-Docusate 1 tab p.o. twice daily, MiraLAX  17 g p.o. twice daily, and Bisacodyl  10 mg RC Dailyprn  Hyperkalemia: K+ is now improved and 4.7 on last check. May need to Hold Spironolactone  if persists. CTM & Trend & repeat CMP intermittently    Essential Hypertension: Blood pressure currently within goal. -Continuing home Coreg , Aldactone  and torsemide . CTM BP per Protocol. Last BP reading was 128/75   Paroxysmal Atrial Fibrillation [facility]): Per documentation patient has declined DOAC in the past.  Also has history of GI bleed. Continue with Carvedilol  3.125 mg po BID. Not on Telemetry   Type 2 Diabetes Mellitus (HCC): Seems well-controlled with CBG within goal and most recent A1c of 6.6 this hospitalization. Continue with SSI. CTM CBGs per protocol. CBG Trend ranging from 98-129   Renal Insufficiency: Fluctuating BUN/Cr Trend now stable and last check was 22/1.15. Avoid Nephrotoxic Medications, Contrast Dyes, Hypotension and Dehydration to Ensure Adequate Renal Perfusion and will need to Renally Adjust Meds. CTM & Trend Renal Fxn carefully & Repeat CMP intermittently   Dyslipidemia: Continue with home Rosuvastatin  20 mg po qHS   Depression with Anxiety:  Continue home Queitapine 400 mg po  qHS and 2 mg po Clonazepam  BIDprn   Normocytic Anemia: Hgb/Hct Trend fairly stable and last check was 12.6/42.1 w/ MCV was 85.9. Checked Anemia Panel and  iron levels 50, UIBC 234, TIBC 284, saturation which was 18%, ferritin of 319, folate levels 11.4 and vitamin B12 was 3228. CTM & Trend and Repeat CBC intermittently if still here  GERD (Gastroesophageal Reflux disease)/GI Prophylaxis: Continue PPI w/ Pantoprazole  40 mg po Daily    OSA (obstructive sleep apnea):  CPAP at night  Hypoalbuminemia: Patient's Albumin Lvl went from 3.9 -> 3.5 -> 3.4 -> 3.3 x2. CTM & Trend & Repeat CMP in the AM  Pressure Ulcer:  Wound 08/15/24 Pressure Injury Buttocks Left Stage 3 -  Full thickness tissue loss. Subcutaneous fat may be visible but bone, tendon or muscle are NOT exposed. (Active)    Class III (Morbid) Obesity: Complicates overall prognosis and care. Estimated body mass index is 48.2 kg/m as calculated from the following:   Height as of this encounter: 6' 2 (1.88 m).   Weight as of this encounter: 170.3 kg. Weight Loss and Dietary Counseling given

## 2024-08-14 NOTE — Assessment & Plan Note (Signed)
 Per documentation patient has declined DOAC in the past.  Also has history of GI bleed. - Continue with Coreg 

## 2024-08-14 NOTE — Assessment & Plan Note (Signed)
 -  CPAP at night

## 2024-08-14 NOTE — Assessment & Plan Note (Signed)
-   Continue home Seroquel  and as needed clonazepam

## 2024-08-14 NOTE — Assessment & Plan Note (Signed)
-  Continue PPI

## 2024-08-14 NOTE — Progress Notes (Signed)
" °   08/14/24 2255  BiPAP/CPAP/SIPAP  BiPAP/CPAP/SIPAP Pt Type Adult  BiPAP/CPAP/SIPAP DREAMSTATIOND  Mask Type Full face mask  Dentures removed? Not applicable  Mask Size Large  Respiratory Rate 19 breaths/min  Flow Rate 2 lpm (bled into circuit)  Patient Home Machine No  Patient Home Mask No  Patient Home Tubing No  Auto Titrate Yes  Minimum cmH2O 5 cmH2O  Maximum cmH2O 20 cmH2O  CPAP/SIPAP surface wiped down Yes  Device Plugged into RED Power Outlet Yes    "

## 2024-08-14 NOTE — Assessment & Plan Note (Signed)
 Seems well-controlled with CBG within goal and most recent A1c of 6.5. - Continue with SSI

## 2024-08-14 NOTE — Assessment & Plan Note (Signed)
-   Continue with home Crestor

## 2024-08-15 ENCOUNTER — Inpatient Hospital Stay (HOSPITAL_COMMUNITY)

## 2024-08-15 DIAGNOSIS — M7989 Other specified soft tissue disorders: Secondary | ICD-10-CM | POA: Diagnosis not present

## 2024-08-15 LAB — COMPREHENSIVE METABOLIC PANEL WITH GFR
ALT: 17 U/L (ref 0–44)
AST: 20 U/L (ref 15–41)
Albumin: 3.5 g/dL (ref 3.5–5.0)
Alkaline Phosphatase: 70 U/L (ref 38–126)
Anion gap: 10 (ref 5–15)
BUN: 19 mg/dL (ref 8–23)
CO2: 29 mmol/L (ref 22–32)
Calcium: 8.9 mg/dL (ref 8.9–10.3)
Chloride: 100 mmol/L (ref 98–111)
Creatinine, Ser: 1.29 mg/dL — ABNORMAL HIGH (ref 0.61–1.24)
GFR, Estimated: 58 mL/min — ABNORMAL LOW
Glucose, Bld: 102 mg/dL — ABNORMAL HIGH (ref 70–99)
Potassium: 5.2 mmol/L — ABNORMAL HIGH (ref 3.5–5.1)
Sodium: 139 mmol/L (ref 135–145)
Total Bilirubin: 0.3 mg/dL (ref 0.0–1.2)
Total Protein: 7.3 g/dL (ref 6.5–8.1)

## 2024-08-15 LAB — CBC WITH DIFFERENTIAL/PLATELET
Abs Immature Granulocytes: 0.09 K/uL — ABNORMAL HIGH (ref 0.00–0.07)
Basophils Absolute: 0 K/uL (ref 0.0–0.1)
Basophils Relative: 0 %
Eosinophils Absolute: 0.5 K/uL (ref 0.0–0.5)
Eosinophils Relative: 4 %
HCT: 39.6 % (ref 39.0–52.0)
Hemoglobin: 12.1 g/dL — ABNORMAL LOW (ref 13.0–17.0)
Immature Granulocytes: 1 %
Lymphocytes Relative: 6 %
Lymphs Abs: 0.9 K/uL (ref 0.7–4.0)
MCH: 26.1 pg (ref 26.0–34.0)
MCHC: 30.6 g/dL (ref 30.0–36.0)
MCV: 85.5 fL (ref 80.0–100.0)
Monocytes Absolute: 1.3 K/uL — ABNORMAL HIGH (ref 0.1–1.0)
Monocytes Relative: 9 %
Neutro Abs: 11 K/uL — ABNORMAL HIGH (ref 1.7–7.7)
Neutrophils Relative %: 80 %
Platelets: 259 K/uL (ref 150–400)
RBC: 4.63 MIL/uL (ref 4.22–5.81)
RDW: 14.4 % (ref 11.5–15.5)
WBC: 13.7 K/uL — ABNORMAL HIGH (ref 4.0–10.5)
nRBC: 0 % (ref 0.0–0.2)

## 2024-08-15 LAB — GLUCOSE, CAPILLARY
Glucose-Capillary: 104 mg/dL — ABNORMAL HIGH (ref 70–99)
Glucose-Capillary: 109 mg/dL — ABNORMAL HIGH (ref 70–99)
Glucose-Capillary: 110 mg/dL — ABNORMAL HIGH (ref 70–99)
Glucose-Capillary: 96 mg/dL (ref 70–99)

## 2024-08-15 LAB — PHOSPHORUS: Phosphorus: 3.6 mg/dL (ref 2.5–4.6)

## 2024-08-15 LAB — MAGNESIUM: Magnesium: 2.2 mg/dL (ref 1.7–2.4)

## 2024-08-15 MED ORDER — COLLAGENASE 250 UNIT/GM EX OINT
TOPICAL_OINTMENT | Freq: Every day | CUTANEOUS | Status: AC
  Administered 2024-08-17 – 2024-08-23 (×6): 1 via TOPICAL
  Filled 2024-08-15: qty 30

## 2024-08-15 MED ORDER — OXYCODONE HCL 5 MG PO TABS
5.0000 mg | ORAL_TABLET | Freq: Four times a day (QID) | ORAL | Status: DC | PRN
  Administered 2024-08-15 – 2024-08-23 (×10): 5 mg via ORAL
  Filled 2024-08-15 (×10): qty 1

## 2024-08-15 MED ORDER — VANCOMYCIN HCL 2000 MG/400ML IV SOLN
2000.0000 mg | INTRAVENOUS | Status: DC
  Administered 2024-08-15: 2000 mg via INTRAVENOUS
  Filled 2024-08-15: qty 400

## 2024-08-15 MED ORDER — CEFAZOLIN SODIUM-DEXTROSE 2-4 GM/100ML-% IV SOLN
2.0000 g | Freq: Three times a day (TID) | INTRAVENOUS | Status: DC
  Administered 2024-08-15 – 2024-08-22 (×21): 2 g via INTRAVENOUS
  Filled 2024-08-15 (×21): qty 100

## 2024-08-15 MED ORDER — LINEZOLID 600 MG/300ML IV SOLN
600.0000 mg | Freq: Two times a day (BID) | INTRAVENOUS | Status: DC
  Administered 2024-08-15 – 2024-08-22 (×14): 600 mg via INTRAVENOUS
  Filled 2024-08-15 (×15): qty 300

## 2024-08-15 NOTE — Plan of Care (Signed)
  Problem: Metabolic: Goal: Ability to maintain appropriate glucose levels will improve Outcome: Progressing   Problem: Nutritional: Goal: Maintenance of adequate nutrition will improve Outcome: Progressing

## 2024-08-15 NOTE — Evaluation (Signed)
 Physical Therapy Evaluation Patient Details Name: Christopher Riley MRN: [id number] DOB: [birth year] Today's Date: 08/15/2024  History of Present Illness  74 y.o. male with medical history significant for heart failure with reduced EF not recovered, DM2, hypertension, OSA, morbid obesity and stasis dermatitis with sacral decubitus ulcer being admitted to the hospital with leg pain due to right lower extremity cellulitis.  Patient was just discharged from the hospital on 1/7 after a stay for acute left-sided heart failure, diuresed successfully and discharged back home.  Clinical Impression  Pt admitted with above diagnosis. Pt reports he mobilizes with a rollator and mechanical bed independently at home. He denies falls in the past 6 months. Today pt required +2 max assist for supine to sit, +2 mod assist for sit to stand. He was able to take a few pivotal steps from bed to recliner with his rollator and contact guard assist. Patient will benefit from continued inpatient follow up therapy, <3 hours/day.  Pt currently with functional limitations due to the deficits listed below (see PT Problem List). Pt will benefit from acute skilled PT to increase their independence and safety with mobility to allow discharge.           If plan is discharge home, recommend the following: A lot of help with walking and/or transfers;A lot of help with bathing/dressing/bathroom;Assistance with cooking/housework;Assist for transportation;Help with stairs or ramp for entrance   Can travel by private vehicle   No    Equipment Recommendations None recommended by PT  Recommendations for Other Services       Functional Status Assessment Patient has had a recent decline in their functional status and demonstrates the ability to make significant improvements in function in a reasonable and predictable amount of time.     Precautions / Restrictions Precautions Precautions: Fall Recall of Precautions/Restrictions:  Intact Precaution/Restrictions Comments: pt denies falls in the past 6 months Restrictions Weight Bearing Restrictions Per Provider Order: No      Mobility  Bed Mobility Overal bed mobility: Needs Assistance Bed Mobility: Sidelying to Sit     Supine to sit: Max assist, +2 for physical assistance, HOB elevated, Used rails     General bed mobility comments: assist to pivot hips, advance RLE and raise trunk; increased time    Transfers Overall transfer level: Needs assistance Equipment used: Rollator (4 wheels) Transfers: Sit to/from Stand, Bed to chair/wheelchair/BSC Sit to Stand: Mod assist, +2 physical assistance, +2 safety/equipment, From elevated surface   Step pivot transfers: Contact guard assist, +2 safety/equipment       General transfer comment: assist to power up from elevated bed; able to take several pivotal steps bed to recliner    Ambulation/Gait                  Stairs            Wheelchair Mobility     Tilt Bed    Modified Rankin (Stroke Patients Only)       Balance                                             Pertinent Vitals/Pain Pain Assessment Pain Score: 8  Pain Location: RLE ankle/skin; buttocks Pain Descriptors / Indicators: Discomfort, Grimacing Pain Intervention(s): Limited activity within patient's tolerance, Monitored during session, Patient requesting pain meds-RN notified, Repositioned    Home Living Family/patient expects  to be discharged to:: Private residence Living Arrangements: Alone   Type of Home: House Home Access: Stairs to enter Entrance Stairs-Rails: None Entrance Stairs-Number of Steps: 3   Home Layout: One level Home Equipment: Rollator (4 wheels);Shower seat;Grab bars - toilet;Grab bars - tub/shower;Adaptive equipment;Hospital bed Additional Comments: pt reports he doesn't have assistance available, his 24 y.o. daughter is near end of life due to renal failure, reports other  family are not local and neighbors aren't of assistance to him    Prior Function Prior Level of Function : Independent/Modified Independent;Driving             Mobility Comments: pt reports ind with rollator ADLs Comments: pt reports indep and would complete in standing     Extremity/Trunk Assessment   Upper Extremity Assessment Upper Extremity Assessment: Defer to OT evaluation    Lower Extremity Assessment Lower Extremity Assessment: RLE deficits/detail;LLE deficits/detail RLE Deficits / Details: knee ext +4/5 RLE Sensation: decreased light touch LLE Deficits / Details: knee ext +4/5 LLE Sensation: decreased light touch    Cervical / Trunk Assessment Cervical / Trunk Assessment: Normal  Communication   Communication Communication: No apparent difficulties Factors Affecting Communication: Reduced clarity of speech    Cognition Arousal: Alert Behavior During Therapy: WFL for tasks assessed/performed   PT - Cognitive impairments: No apparent impairments                         Following commands: Intact       Cueing Cueing Techniques: Verbal cues     General Comments      Exercises     Assessment/Plan    PT Assessment Patient needs continued PT services  PT Problem List Decreased strength;Decreased activity tolerance;Decreased balance;Obesity;Impaired sensation;Pain       PT Treatment Interventions DME instruction;Gait training;Stair training;Functional mobility training;Therapeutic activities;Therapeutic exercise;Balance training;Patient/family education    PT Goals (Current goals can be found in the Care Plan section)  Acute Rehab PT Goals Patient Stated Goal: return home PT Goal Formulation: With patient Time For Goal Achievement: 08/29/24 Potential to Achieve Goals: Good    Frequency Min 3X/week     Co-evaluation               AM-PAC PT 6 Clicks Mobility  Outcome Measure Help needed turning from your back to your side  while in a flat bed without using bedrails?: A Lot Help needed moving from lying on your back to sitting on the side of a flat bed without using bedrails?: Total Help needed moving to and from a bed to a chair (including a wheelchair)?: A Lot Help needed standing up from a chair using your arms (e.g., wheelchair or bedside chair)?: Total Help needed to walk in hospital room?: A Lot Help needed climbing 3-5 steps with a railing? : Total 6 Click Score: 9    End of Session Equipment Utilized During Treatment: Gait belt Activity Tolerance: Patient limited by fatigue;Patient limited by pain Patient left: in chair;with chair alarm set;with call bell/phone within reach;with nursing/sitter in room Nurse Communication: Mobility status PT Visit Diagnosis: Other abnormalities of gait and mobility (R26.89);Muscle weakness (generalized) (M62.81);Pain Pain - Right/Left: Right Pain - part of body: Ankle and joints of foot    Time: 1419-1449 PT Time Calculation (min) (ACUTE ONLY): 30 min   Charges:   PT Evaluation $PT Eval Moderate Complexity: 1 Mod PT Treatments $Therapeutic Activity: 8-22 mins PT General Charges $$ ACUTE PT VISIT: 1 Visit  Sylvan Nest Kistler PT 08/15/2024  Acute Rehabilitation Services  Office [phone]

## 2024-08-15 NOTE — Progress Notes (Signed)
Pt to call when ready for CPAP.

## 2024-08-15 NOTE — Progress Notes (Signed)
 " PROGRESS NOTE    Christopher Riley  FMW:[id number] DOB: June 09, 1951 DOA: 08/13/2024 PCP: Claude Reagin, NP   Brief Narrative:  Partly taken from prior notes.   Christopher Riley is a 74 y.o. male with medical history significant for heart failure with reduced EF not recovered, DM2, hypertension, OSA, morbid obesity and stasis dermatitis with sacral decubitus ulcer being admitted to the hospital with leg pain due to right lower extremity cellulitis.  Patient was just discharged from the hospital on 1/7 after a stay for acute left-sided heart failure, diuresed successfully and discharged back home.   Workup in ED with leukocytosis, CT scan consistent with leg edema and cellulitis.  Blood cultures were drawn and patient was started on Rocephin  and vancomycin .  1/20: Patient became febrile last night up to 103, preliminary blood cultures negative.  CTA abdomen was negative for any significant stenosis.  More consistent with cellulitis and inguinal lymphadenopathy.  Assessment and Plan:  Sepsis due to cellulitis [facility]): Patient met sepsis criteria with fever, leukocytosis and tachycardia.  Likely secondary to right lower extremity cellulitis as evident on imaging.  Preliminary blood cultures negative. Continued with IV ceftriaxone  and vancomycin  but will change to Cefazolin  and Linezolid  -Continue with supportive care -PT/OT evaluation ordered and recommending SNF but patient is refusing  -Right lower extremity venous Doppler ordered and showed no DVT   Chronic heart failure with preserved ejection fraction (HFpEF) (HCC) Recent echo with normal EF and indeterminate diastolic function.  Recent admission secondary to CHF exacerbation. -Avoid excessive IV fluid -Continue home Coreg , Crestor , Aldactone  and torsemide  and Jardiance . Strict I's and O's and daily Weights.    Essential Hypertension: Blood pressure currently within goal. -Continuing home Coreg , Aldactone  and torsemide    Paroxysmal  atrial fibrillation (HCC) Per documentation patient has declined DOAC in the past.  Also has history of GI bleed. - Continue with Coreg    Type 2 diabetes mellitus (HCC): Seems well-controlled with CBG within goal and most recent A1c of 6.6 this hospitalization. Continue with SSI. CTM CBGs per protocol. CBG Trend:  Recent Labs  Lab 08/13/24 2141 08/14/24 0735 08/14/24 1700 08/14/24 2118 08/15/24 0754 08/15/24 1147 08/15/24 1641  GLUCAP 126* 76 105* 124* 104* 109* 110*    Renal Insufficiency: Fluctuating BUN/Cr Trend: Recent Labs  Lab 07/28/24 1525 07/30/24 0331 07/31/24 1218 08/01/24 0422 08/13/24 0520 08/13/24 2231 08/14/24 2115  BUN 15 16 15 19 14 16 20   CREATININE 0.83 1.01 1.10 1.29* 1.11 1.05 1.29*  -Avoid Nephrotoxic Medications, Contrast Dyes, Hypotension and Dehydration to Ensure Adequate Renal Perfusion and will need to Renally Adjust Meds -Continue to Monitor and Trend Renal Function carefully and repeat CMP in the AM   Dyslipidemia: Continue with home Crestor    Depression with anxiety:  Continue home Seroquel  and as needed clonazepam    Normocytic Anemia: Hgb/Hct Trend:  Recent Labs  Lab 07/21/24 0038 07/28/24 1525 07/30/24 0331 08/13/24 0520 08/13/24 2231 08/14/24 2115  HGB 13.0 12.9* 11.8* 12.1* 11.9* 11.4*  HCT 42.0 42.7 38.4* 38.3* 38.3* 37.7*  MCV 86.2 87.5 84.6 85.1 85.5 85.9  -Check Anemia Panel in the AM  GERD (gastroesophageal reflux disease)/GI Prophylaxis:  Continue PPI   OSA (obstructive sleep apnea):  CPAP at night  Pressure Ulcer:  Wound 08/15/24 Pressure Injury Buttocks Left Stage 3 -  Full thickness tissue loss. Subcutaneous fat may be visible but bone, tendon or muscle are NOT exposed. (Active)    Class III (Morbid) Obesity: Complicates overall prognosis and care. Estimated body  mass index is 48.2 kg/m as calculated from the following:   Height as of this encounter: 6' 2 (1.88 m).   Weight as of this encounter: 170.3 kg. Weight  Loss and Dietary Counseling given   DVT prophylaxis: SCDs    Code Status: Full Code Family Communication: No family present @ bedside  Disposition Plan:  Level of care: Med-Surg Status is: Inpatient Remains inpatient appropriate because: Needs further clinical improvement. PT/OT Recommending SNF but he is refusing   Consultants:  None  Procedures:  As delineated as above   Antimicrobials:  Anti-infectives (From admission, onward)    Start     Dose/Rate Route Frequency Ordered Stop   08/15/24 2200  linezolid  (ZYVOX ) IVPB 600 mg        600 mg 300 mL/hr over 60 Minutes Intravenous Every 12 hours 08/15/24 2024     08/15/24 1000  vancomycin  (VANCOREADY) IVPB 2000 mg/400 mL  Status:  Discontinued        2,000 mg 200 mL/hr over 120 Minutes Intravenous Every 24 hours 08/15/24 0828 08/15/24 2024   08/14/24 1000  vancomycin  (VANCOCIN ) 2,500 mg in sodium chloride  0.9 % 500 mL IVPB  Status:  Discontinued        2,500 mg 262.5 mL/hr over 120 Minutes Intravenous Every 24 hours 08/13/24 0826 08/15/24 0828   08/13/24 0815  cefTRIAXone  (ROCEPHIN ) 2 g in sodium chloride  0.9 % 100 mL IVPB  Status:  Discontinued        2 g 200 mL/hr over 30 Minutes Intravenous Every 24 hours 08/13/24 0808 08/15/24 2024   08/13/24 0800  vancomycin  (VANCOCIN ) 2,500 mg in sodium chloride  0.9 % 500 mL IVPB        2,500 mg 262.5 mL/hr over 120 Minutes Intravenous  Once 08/13/24 0748 08/13/24 1044   08/13/24 0545  cefTRIAXone  (ROCEPHIN ) 2 g in sodium chloride  0.9 % 100 mL IVPB        2 g 200 mL/hr over 30 Minutes Intravenous  Once 08/13/24 0542 08/13/24 9376       Subjective: Seen and examined at bedside and leg is less swollen per the patient. No lightheadedness. Denied any CP or SOB. Complaining of Left Buttock Pain. No other concerns or complaints at this time.   Objective: Vitals:   08/15/24 0508 08/15/24 0732 08/15/24 1309 08/15/24 2024  BP: (!) 161/73  136/74 (!) 144/72  Pulse: 82  82 97  Resp: 19  15  16   Temp: 98.1 F (36.7 C)  98.4 F (36.9 C) 99.7 F (37.6 C)  TempSrc: Oral   Oral  SpO2: 96%  96% 91%  Weight:  (!) 170.3 kg    Height:  6' 2 (1.88 m)      Intake/Output Summary (Last 24 hours) at 08/15/2024 2025 Last data filed at 08/15/2024 1800 Gross per 24 hour  Intake 1060.05 ml  Output 1050 ml  Net 10.05 ml   Filed Weights   08/15/24 0732  Weight: (!) 170.3 kg   Examination: Physical Exam:  Constitutional: WN/WD morbidly obese AAM in NAD Respiratory: Diminished to auscultation bilaterally with coarse breath sounds, no wheezing, rales, rhonchi or crackles. Normal respiratory effort and patient is not tachypenic. No accessory muscle use. Unlabored breathing  Cardiovascular: RRR, no murmurs / rubs / gallops. S1 and S2 auscultated. 1+ LE edema Abdomen: Soft, non-tender, Distended 2/2 body habitus. Bowel sounds positive.  GU: Deferred. Musculoskeletal: No clubbing / cyanosis of digits/nails. No joint deformity upper and lower extremities.  Skin: Right LE  a little erythematous and swollen. Not as warm Neurologic: CN 2-12 grossly intact with no focal deficits. Romberg sign and cerebellar reflexes not assessed.  Psychiatric: Normal judgment and insight. Alert and oriented x 3. Normal mood and appropriate affect.   Data Reviewed: I have personally reviewed following labs and imaging studies  CBC: Recent Labs  Lab 08/13/24 0520 08/13/24 2231 08/14/24 2115  WBC 12.4* 12.1* 12.6*  NEUTROABS  --  9.4*  --   HGB 12.1* 11.9* 11.4*  HCT 38.3* 38.3* 37.7*  MCV 85.1 85.5 85.9  PLT 254 234 250   Basic Metabolic Panel: Recent Labs  Lab 08/13/24 0520 08/13/24 2231 08/14/24 2115  NA 141 139 138  K 4.0 4.5 4.4  CL 102 102 100  CO2 32 29 29  GLUCOSE 113* 112* 115*  BUN 14 16 20   CREATININE 1.11 1.05 1.29*  CALCIUM  9.2 8.8* 8.7*   GFR: Estimated Creatinine Clearance: 82.2 mL/min (A) (by C-G formula based on SCr of 1.29 mg/dL (H)). Liver Function Tests: Recent Labs   Lab 08/13/24 0520  AST 20  ALT 16  ALKPHOS 72  BILITOT 0.4  PROT 7.2  ALBUMIN 3.9   No results for input(s): LIPASE, AMYLASE in the last 168 hours. No results for input(s): AMMONIA in the last 168 hours. Coagulation Profile: No results for input(s): INR, PROTIME in the last 168 hours. Cardiac Enzymes: No results for input(s): CKTOTAL, CKMB, CKMBINDEX, TROPONINI in the last 168 hours. BNP (last 3 results) Recent Labs    07/21/24 0039 07/28/24 1525  PROBNP 351.0* 180.0   HbA1C: Recent Labs    08/13/24 0520  HGBA1C 6.6*   CBG: Recent Labs  Lab 08/14/24 1700 08/14/24 2118 08/15/24 0754 08/15/24 1147 08/15/24 1641  GLUCAP 105* 124* 104* 109* 110*   Lipid Profile: No results for input(s): CHOL, HDL, LDLCALC, TRIG, CHOLHDL, LDLDIRECT in the last 72 hours. Thyroid Function Tests: No results for input(s): TSH, T4TOTAL, FREET4, T3FREE, THYROIDAB in the last 72 hours. Anemia Panel: No results for input(s): VITAMINB12, FOLATE, FERRITIN, TIBC, IRON, RETICCTPCT in the last 72 hours. Sepsis Labs: Recent Labs  Lab 08/13/24 1035  LATICACIDVEN 0.6   Recent Results (from the past 240 hours)  Culture, blood (Routine X 2) w Reflex to ID Panel     Status: None (Preliminary result)   Collection Time: 08/13/24  5:26 AM   Specimen: BLOOD  Result Value Ref Range Status   Specimen Description   Final    BLOOD LEFT ANTECUBITAL Performed at [facility], [address], [place], [place] [address]    Special Requests   Final    BOTTLES DRAWN AEROBIC AND ANAEROBIC Blood Culture results may not be optimal due to an inadequate volume of blood received in culture bottles Performed at [facility], [address] [place], [place] [address]    Culture   Final    NO GROWTH 2 DAYS Performed at [facility], [address] [place], [place] [address]    Report Status PENDING  Incomplete   Culture, blood (Routine X 2) w Reflex to ID Panel     Status: None (Preliminary result)   Collection Time: 08/13/24  5:37 AM   Specimen: BLOOD  Result Value Ref Range Status   Specimen Description   Final    BLOOD RIGHT ANTECUBITAL Performed at [facility], [address] [place], [place] [address]    Special Requests   Final    BOTTLES  DRAWN AEROBIC ONLY Blood Culture results may not be optimal due to an inadequate volume of blood received in culture bottles Performed at [facility], [address], [place], [place] [address]    Culture   Final    NO GROWTH 2 DAYS Performed at [facility], [address] [place], [place] [address]    Report Status PENDING  Incomplete  Resp panel by RT-PCR (RSV, Flu A&B, Covid) Anterior Nasal Swab     Status: None   Collection Time: 08/13/24 10:49 PM   Specimen: Anterior Nasal Swab  Result Value Ref Range Status   SARS Coronavirus 2 by RT PCR NEGATIVE NEGATIVE Final    Comment: (NOTE) SARS-CoV-2 target nucleic acids are NOT DETECTED.  The SARS-CoV-2 RNA is generally detectable in upper respiratory specimens during the acute phase of infection. The lowest concentration of SARS-CoV-2 viral copies this assay can detect is 138 copies/mL. A negative result does not preclude SARS-Cov-2 infection and should not be used as the sole basis for treatment or other patient management decisions. A negative result may occur with  improper specimen collection/handling, submission of specimen other than nasopharyngeal swab, presence of viral mutation(s) within the areas targeted by this assay, and inadequate number of viral copies(<138 copies/mL). A negative result must be combined with clinical observations, patient history, and epidemiological information. The expected result is Negative.  Fact Sheet for Patients:  bloggercourse.com  Fact Sheet for Healthcare Providers:   seriousbroker.it  This test is no t yet approved or cleared by the [place]  FDA and  has been authorized for detection and/or diagnosis of SARS-CoV-2 by FDA under an Emergency Use Authorization (EUA). This EUA will remain  in effect (meaning this test can be used) for the duration of the COVID-19 declaration under Section 564(b)(1) of the Act, 21 U.S.C.section 360bbb-3(b)(1), unless the authorization is terminated  or revoked sooner.       Influenza A by PCR NEGATIVE NEGATIVE Final   Influenza B by PCR NEGATIVE NEGATIVE Final    Comment: (NOTE) The Xpert Xpress SARS-CoV-2/FLU/RSV plus assay is intended as an aid in the diagnosis of influenza from Nasopharyngeal swab specimens and should not be used as a sole basis for treatment. Nasal washings and aspirates are unacceptable for Xpert Xpress SARS-CoV-2/FLU/RSV testing.  Fact Sheet for Patients: bloggercourse.com  Fact Sheet for Healthcare Providers: seriousbroker.it  This test is not yet approved or cleared by the [place]  FDA and has been authorized for detection and/or diagnosis of SARS-CoV-2 by FDA under an Emergency Use Authorization (EUA). This EUA will remain in effect (meaning this test can be used) for the duration of the COVID-19 declaration under Section 564(b)(1) of the Act, 21 U.S.C. section 360bbb-3(b)(1), unless the authorization is terminated or revoked.     Resp Syncytial Virus by PCR NEGATIVE NEGATIVE Final    Comment: (NOTE) Fact Sheet for Patients: bloggercourse.com  Fact Sheet for Healthcare Providers: seriousbroker.it  This test is not yet approved or cleared by the [place]  FDA and has been authorized for detection and/or diagnosis of SARS-CoV-2 by FDA under an Emergency Use Authorization (EUA). This EUA will remain in effect (meaning this test can be used) for  the duration of the COVID-19 declaration under Section 564(b)(1) of the Act, 21 U.S.C. section 360bbb-3(b)(1), unless the authorization is terminated or revoked.  Performed at [facility], [address], [place], [place] [address]     Radiology Studies: VAS US  LOWER EXTREMITY VENOUS (DVT)  Result Date: 08/15/2024  Lower Venous DVT Study Patient Name:  QUE MENEELY  Date of Exam:   08/15/2024 Medical Rec #: [id number]         Accession #:    [id number] Date of Birth: September 14, 1950         Patient Gender: M Patient Age:   89 years Exam Location:  [facility] Procedure:      VAS US  LOWER EXTREMITY VENOUS (DVT) Referring Phys: SUMAYYA AMIN --------------------------------------------------------------------------------  Indications: Swelling, and Edema.  Comparison Study: Previous study of the right lower extremity on 11.12.2025. Performing Technologist: Edilia Elden Appl  Examination Guidelines: A complete evaluation includes B-mode imaging, spectral Doppler, color Doppler, and power Doppler as needed of all accessible portions of each vessel. Bilateral testing is considered an integral part of a complete examination. Limited examinations for reoccurring indications may be performed as noted. The reflux portion of the exam is performed with the patient in reverse Trendelenburg.  +---------+---------------+---------+-----------+----------+-------------------+ RIGHT    CompressibilityPhasicitySpontaneityPropertiesThrombus Aging      +---------+---------------+---------+-----------+----------+-------------------+ CFV      Full           Yes      Yes                                      +---------+---------------+---------+-----------+----------+-------------------+ SFJ      Full           Yes      Yes                                      +---------+---------------+---------+-----------+----------+-------------------+ FV Prox  Full                                                              +---------+---------------+---------+-----------+----------+-------------------+ FV Mid   Full           No       Yes                  Limited             +---------+---------------+---------+-----------+----------+-------------------+ FV DistalFull           Yes      Yes                                      +---------+---------------+---------+-----------+----------+-------------------+ PFV      Full                                                             +---------+---------------+---------+-----------+----------+-------------------+ POP      Full           Yes      Yes                                      +---------+---------------+---------+-----------+----------+-------------------+  PTV      Full                                                             +---------+---------------+---------+-----------+----------+-------------------+ PERO                                                  Not well                                                                  visualized, patent                                                        by color.           +---------+---------------+---------+-----------+----------+-------------------+   +----+---------------+---------+-----------+----------+--------------+ LEFTCompressibilityPhasicitySpontaneityPropertiesThrombus Aging +----+---------------+---------+-----------+----------+--------------+ CFV Full           Yes      Yes                                 +----+---------------+---------+-----------+----------+--------------+ SFJ Full           Yes      Yes                                 +----+---------------+---------+-----------+----------+--------------+     Summary: RIGHT: - There is no evidence of deep vein thrombosis in the lower extremity. However, portions of this examination were limited- see technologist comments above.  - No cystic structure  found in the popliteal fossa.  LEFT: - No evidence of common femoral vein obstruction.   *See table(s) above for measurements and observations. Electronically signed by Norman Serve on 08/15/2024 at 1:12:12 PM.    Final    Scheduled Meds:  carvedilol   3.125 mg Oral BID WC   collagenase    Topical Daily   empagliflozin   10 mg Oral Daily   enoxaparin  (LOVENOX ) injection  80 mg Subcutaneous Q24H   insulin  aspart  0-15 Units Subcutaneous TID WC   insulin  aspart  0-5 Units Subcutaneous QHS   latanoprost   1 drop Both Eyes QHS   pantoprazole   40 mg Oral Daily   QUEtiapine   400 mg Oral QHS   rosuvastatin   20 mg Oral QHS   spironolactone   25 mg Oral Daily   torsemide   20 mg Oral Daily   Continuous Infusions:  linezolid  (ZYVOX ) IV      LOS: 2 days   Alejandro Marker, DO [facility] Available via Epic secure chat 7am-7pm After these hours, please refer to coverage provider listed on [email or web address] 08/15/2024, 8:25 PM  "

## 2024-08-15 NOTE — Progress Notes (Signed)
 PT Cancellation Note  Patient Details Name: Christopher Riley MRN: [id number] DOB: 1950-09-27   Cancelled Treatment:    Reason Eval/Treat Not Completed: Other (comment) (dopplers pending to r/o DVT. Will follow.)   Sylvan Delon Copp PT 08/15/2024  Acute Rehabilitation Services  Office [phone]

## 2024-08-15 NOTE — Progress Notes (Signed)
 OT Cancellation Note  Patient Details Name: Christopher Riley MRN: [id number] DOB: [birth year]   Cancelled Treatment:    Reason Eval/Treat Not Completed: Medical issues which prohibited therapy. Pt pending US  to rule out DVT, OT will follow and evaluate as appropriate.   Clyda Smyth L. Smith Mcnicholas, OTR/L  08/15/24, 11:20 AM

## 2024-08-15 NOTE — TOC Progression Note (Addendum)
 Transition of Care [facility]) - Progression Note    Patient Details  Name: Christopher Riley MRN: [id number] Date of Birth: 1950/09/22  Transition of Care [facility]) CM/SW Contact  [name], RN Phone Number: 08/15/2024, 4:16 PM  Clinical Narrative:   Met with patient at bedside to introduce role of INPT CM and review for dc planning, PT recommendation for short term rehab/SNF, patient declined, states he is open to [facility] PT/OT/HHA/RN, team notified. . Patient reports he resides alone, he does have children who live locally, states he is independent with his self care, still drives, uses a Rollator for functional mobility. Pt reports he has transportation available for discharge.     Expected Discharge Plan: Home w Home Health Services                 Expected Discharge Plan and Services       Living arrangements for the past 2 months: Single Family Home                                       Social Drivers of Health (SDOH) Interventions SDOH Screenings   Food Insecurity: No Food Insecurity (08/13/2024)  Housing: Low Risk (08/13/2024)  Transportation Needs: Unmet Transportation Needs (08/13/2024)  Utilities: Not At Risk (08/13/2024)  Social Connections: Socially Isolated (08/13/2024)  Tobacco Use: Low Risk (08/13/2024)    Readmission Risk Interventions     No data to display

## 2024-08-15 NOTE — Consult Note (Addendum)
" °  CLINICAL SUPPORT TEAM - WOUND OSTOMY AND CONTINENCE TEAM  CONSULTATION SERVICES   WOC Nurse-Inpatient Note  WOC Nurse Consult Note: Reason for Consult: Consult requested for buttock wound. Pt has a high BMI and limited mobility and is frequently incontinent.  Wound type: Left buttock with a chronic Stage 3/Unstageable pressure injury, 4X4X.2cm, 60% yellow 30% yellow, mod amt tan drainage.    Pressure Injury POA: Yes  Topical treatment orders provided for bedside nurses to perform as follows to assist with removal of nonviable tissue: Apply Santyl  to left buttock wound Q day, then cover with moist gauze and foam dressing.  Change foam dressing Q 3 days or PRN soiling.  Please re-consult if further assistance is needed.  Thank-you,  Stephane Fought MSN, RN, CWOCN, CWCN-AP, CNS Contact Mon-Fri 0700-1500: [phone]    "

## 2024-08-15 NOTE — Progress Notes (Signed)
 Pharmacy Antibiotic Note  Christopher Riley is a 74 y.o. male admitted on 08/13/2024 with cellulitis.  Pharmacy has been consulted for vancomycin  dosing.  Day 3 vanc/rocephin  Tmax 101 on 1/20 at 1539 WBC 12.6 SCr 1.29, up  Plan: Due to rise in SCr, change vancomycin  from 2500mg  IV q24 to 2g IV q24 - goal AUC 400-550 Ceftriaxone  2 g IV q24 per MD   Height: 6' 2 (188 cm) Weight: (!) 170.3 kg (375 lb 6.3 oz) IBW/kg (Calculated) : 82.2  Temp (24hrs), Avg:99.4 F (37.4 C), Min:98.1 F (36.7 C), Max:101 F (38.3 C)  Recent Labs  Lab 08/13/24 0520 08/13/24 1035 08/13/24 2231 08/14/24 2115  WBC 12.4*  --  12.1* 12.6*  CREATININE 1.11  --  1.05 1.29*  LATICACIDVEN  --  0.6  --   --     Estimated Creatinine Clearance: 82.2 mL/min (A) (by C-G formula based on SCr of 1.29 mg/dL (H)).    Allergies[1]  Antimicrobials this admission: 1/19 vanc >> 1/19 ceftriaxone  Dose adjustments this admission:  Microbiology results: 1/19 BCx2: ngtd   Christopher Riley, PharmD, BCPS Secure Chat if ?s 08/15/2024 8:26 AM       [1]  Allergies Allergen Reactions   Nsaids Other (See Comments)    Hisotory of G.I. bleeds   Other Other (See Comments)    Non steroidal anti inflammatory analgesic - GI bleed

## 2024-08-15 NOTE — Progress Notes (Signed)
 Right lower extremity venous duplex has been completed.  Results can be found in chart review under CV Proc.  08/15/2024 12:52 PM  Abdulmalik Darco Elden Appl, RVT.

## 2024-08-16 ENCOUNTER — Inpatient Hospital Stay (HOSPITAL_COMMUNITY)

## 2024-08-16 LAB — GLUCOSE, CAPILLARY
Glucose-Capillary: 95 mg/dL (ref 70–99)
Glucose-Capillary: 160 mg/dL — ABNORMAL HIGH (ref 70–99)
Glucose-Capillary: 78 mg/dL (ref 70–99)
Glucose-Capillary: 135 mg/dL — ABNORMAL HIGH (ref 70–99)

## 2024-08-16 MED ORDER — LIDOCAINE-PRILOCAINE 2.5-2.5 % EX CREA
1.0000 | TOPICAL_CREAM | Freq: Once | CUTANEOUS | Status: AC
  Administered 2024-08-16: 1 via TOPICAL
  Filled 2024-08-16: qty 5

## 2024-08-16 MED ORDER — IPRATROPIUM-ALBUTEROL 0.5-2.5 (3) MG/3ML IN SOLN
3.0000 mL | Freq: Four times a day (QID) | RESPIRATORY_TRACT | Status: DC
  Administered 2024-08-17 – 2024-08-18 (×3): 3 mL via RESPIRATORY_TRACT
  Filled 2024-08-16 (×5): qty 3

## 2024-08-16 MED ORDER — GUAIFENESIN ER 600 MG PO TB12
1200.0000 mg | ORAL_TABLET | Freq: Two times a day (BID) | ORAL | Status: DC
  Administered 2024-08-16 – 2024-08-22 (×13): 1200 mg via ORAL
  Filled 2024-08-16 (×13): qty 2

## 2024-08-16 MED ORDER — FUROSEMIDE 10 MG/ML IJ SOLN
40.0000 mg | Freq: Once | INTRAMUSCULAR | Status: AC
  Administered 2024-08-16: 40 mg via INTRAVENOUS
  Filled 2024-08-16: qty 4

## 2024-08-16 NOTE — Plan of Care (Signed)
  Problem: Metabolic: Goal: Ability to maintain appropriate glucose levels will improve Outcome: Progressing   Problem: Nutritional: Goal: Maintenance of adequate nutrition will improve Outcome: Progressing   Problem: Skin Integrity: Goal: Risk for impaired skin integrity will decrease Outcome: Progressing   

## 2024-08-16 NOTE — Plan of Care (Signed)
  Problem: Metabolic: Goal: Ability to maintain appropriate glucose levels will improve Outcome: Adequate for Discharge

## 2024-08-16 NOTE — Evaluation (Signed)
 Occupational Therapy Evaluation Patient Details Name: Christopher Riley MRN: [id number] DOB: [birth year] Today's Date: 08/16/2024   History of Present Illness   74 y.o. male with medical history significant for heart failure with reduced EF not recovered, DM2, hypertension, OSA, morbid obesity and stasis dermatitis with sacral decubitus ulcer being admitted to the hospital with leg pain due to right lower extremity cellulitis.  Patient was just discharged from the hospital on 1/7 after a stay for acute left-sided heart failure, diuresed successfully and discharged back home.     Clinical Impressions Pt admitted with the above concerns. Pt currently with functional limitations due to the deficits listed below (see OT Problem List). Prior to admit, pt reports that he lived alone and was mod I for all ADL's and IADL's utilizing Rolator for functional mobility.  Pt will benefit from acute skilled OT to increase their safety and independence with ADL and functional mobility for ADL to facilitate discharge. SNF recommended although pt adamant about returning home so home health OT recommended if pt does return home.     If plan is discharge home, recommend the following:   A lot of help with walking and/or transfers;A lot of help with bathing/dressing/bathroom;Help with stairs or ramp for entrance;Assistance with cooking/housework     Functional Status Assessment   Patient has had a recent decline in their functional status and demonstrates the ability to make significant improvements in function in a reasonable and predictable amount of time.     Equipment Recommendations   None recommended by OT      Precautions/Restrictions   Precautions Precautions: Fall Recall of Precautions/Restrictions: Intact Restrictions Weight Bearing Restrictions Per Provider Order: No     Mobility Bed Mobility Overal bed mobility: Needs Assistance Bed Mobility: Supine to Sit     Supine to sit:  Mod assist, HOB elevated, Used rails     General bed mobility comments: patient able to complete 50% of bed mobility while transitioning from supine to sitting EOB before requiring additional physical assistance. Bed pad used while pt scooted right hip towards EOB to allow feet to touch floor. Increased time needed to complete task with [facility] elevated all the way.    Transfers Overall transfer level: Needs assistance Equipment used: Rollator (4 wheels) Transfers: Sit to/from Stand, Bed to chair/wheelchair/BSC Sit to Stand: Contact guard assist, From elevated surface     Step pivot transfers: Contact guard assist     General transfer comment: Bed elevated. Pt utilized momentum and 2 attempts before achieving standing posture. Slow transition from sit to standing upright.      Balance Overall balance assessment: Needs assistance Sitting-balance support: No upper extremity supported, Feet supported Sitting balance-Leahy Scale: Good Sitting balance - Comments: sitting EOB   Standing balance support: Bilateral upper extremity supported, During functional activity, Reliant on assistive device for balance Standing balance-Leahy Scale: Poor      ADL either performed or assessed with clinical judgement   ADL       Grooming: Wash/dry hands;Set up;Sitting   Upper Body Bathing: Set up;Sitting   Lower Body Bathing: Maximal assistance;Sitting/lateral leans;Sit to/from stand   Upper Body Dressing : Set up;Sitting   Lower Body Dressing: Total assistance;Bed level (sock management)   Toilet Transfer: Contact guard assist;Cueing for safety;Cueing for sequencing;Rollator (4 wheels);BSC/3in1   Toileting- Clothing Manipulation and Hygiene: Total assistance;Sit to/from stand       Functional mobility during ADLs: Contact guard assist;Cueing for safety;Rollator (4 wheels)  Vision Baseline Vision/History: 0 No visual deficits Ability to See in Adequate Light: 0 Adequate Patient  Visual Report: No change from baseline Vision Assessment?: No apparent visual deficits     Perception Perception: Within Functional Limits       Praxis Praxis: WFL       Pertinent Vitals/Pain Pain Assessment Pain Assessment: Faces Faces Pain Scale: Hurts even more Pain Location: R leg when touched, left buttocks Pain Descriptors / Indicators: Discomfort, Grimacing Pain Intervention(s): Limited activity within patient's tolerance, Monitored during session, Repositioned     Extremity/Trunk Assessment Upper Extremity Assessment Upper Extremity Assessment: Generalized weakness   Lower Extremity Assessment Lower Extremity Assessment: Defer to PT evaluation   Cervical / Trunk Assessment Cervical / Trunk Assessment: Other exceptions Cervical / Trunk Exceptions: body habitus   Communication Communication Communication: No apparent difficulties   Cognition Arousal: Alert Behavior During Therapy: WFL for tasks assessed/performed Cognition: No apparent impairments     Following commands: Intact       Cueing  General Comments   Cueing Techniques: Verbal cues  VSS on RA           Home Living Family/patient expects to be discharged to:: Private residence Living Arrangements: Alone Available Help at Discharge:  (Pt reports he has no support system) Type of Home: House Home Access: Stairs to enter Secretary/administrator of Steps: 3 Entrance Stairs-Rails: None Home Layout: One level     Bathroom Shower/Tub: Producer, Television/film/video: Standard Bathroom Accessibility: No   Home Equipment: Rollator (4 wheels);Shower seat;Grab bars - toilet;Grab bars - tub/shower;Adaptive equipment;Hospital bed Adaptive Equipment: Reacher;Sock aid        Prior Functioning/Environment Prior Level of Function : Independent/Modified Independent;Driving    Mobility Comments: Uses Rolator      OT Problem List: Decreased strength;Decreased activity tolerance;Impaired balance  (sitting and/or standing);Pain;Obesity;Decreased knowledge of use of DME or AE   OT Treatment/Interventions: Self-care/ADL training;Therapeutic exercise;DME and/or AE instruction;Energy conservation;Therapeutic activities;Balance training;Patient/family education      OT Goals(Current goals can be found in the care plan section)   Acute Rehab OT Goals Patient Stated Goal: to go home OT Goal Formulation: With patient Time For Goal Achievement: 08/30/24 Potential to Achieve Goals: Good   OT Frequency:  Min 2X/week       AM-PAC OT 6 Clicks Daily Activity     Outcome Measure Help from another person eating meals?: None Help from another person taking care of personal grooming?: None Help from another person toileting, which includes using toliet, bedpan, or urinal?: A Lot Help from another person bathing (including washing, rinsing, drying)?: A Lot Help from another person to put on and taking off regular upper body clothing?: A Little Help from another person to put on and taking off regular lower body clothing?: Total 6 Click Score: 16   End of Session Equipment Utilized During Treatment: Gait belt;Rollator (4 wheels) Nurse Communication: Mobility status  Activity Tolerance: Patient tolerated treatment well Patient left: in chair;with call bell/phone within reach;with chair alarm set  OT Visit Diagnosis: Other abnormalities of gait and mobility (R26.89);Muscle weakness (generalized) (M62.81);Pain Pain - Right/Left: Right Pain - part of body: Ankle and joints of foot                Time: 8979-8887 OT Time Calculation (min): 52 min Charges:  OT General Charges $OT Visit: 1 Visit OT Evaluation $OT Eval Moderate Complexity: 1 Mod OT Treatments $Self Care/Home Management : 23-37 mins  Leita Howell, OTR/L,CBIS  Supplemental OT - [facility] and [facility] Secure Chat Preferred    Favor Hackler, Leita BIRCH 08/16/2024, 1:36 PM

## 2024-08-16 NOTE — Progress Notes (Signed)
 " PROGRESS NOTE    [name]  FMW:[id number] DOB: Jul 19, 1951 DOA: 08/13/2024 PCP: Claude Reagin, NP   Brief Narrative:  Partly taken from prior notes.   Christopher Riley is a 74 y.o. male with medical history significant for heart failure with reduced EF not recovered, DM2, hypertension, OSA, morbid obesity and stasis dermatitis with sacral decubitus ulcer being admitted to the hospital with leg pain due to right lower extremity cellulitis.  Patient was just discharged from the hospital on 1/7 after a stay for acute left-sided heart failure, diuresed successfully and discharged back home.   Workup in ED with leukocytosis, CT scan consistent with leg edema and cellulitis.  Blood cultures were drawn and patient was started on Rocephin  and vancomycin .  1/20: Patient became febrile last night up to 103, preliminary blood cultures negative.  CTA abdomen was negative for any significant stenosis.  More consistent with cellulitis and inguinal lymphadenopathy.  1/21: De-escalating Abx to IV Cefazolin  and Linezolid .   1/22: Had some Dyspnea and CXR showed mild cardiovascular congestion so will give a Dose of IV Lasix  in addition to Torsemide .   Assessment and Plan:  Sepsis due to cellulitis [facility]): Patient met sepsis criteria with fever, leukocytosis and tachycardia.  Likely secondary to right lower extremity cellulitis as evident on imaging.  Preliminary blood cultures negative. Continued with IV ceftriaxone  and vancomycin  but will change to Cefazolin  and Linezolid ; Leg is slowly improving -Will need to be OOB and Elevate Extremity  -WBC Trend remains elevated:  Recent Labs  Lab 07/21/24 0038 07/28/24 1525 07/30/24 0331 08/13/24 0520 08/13/24 2231 08/14/24 2115 08/15/24 2102  WBC 8.8 8.5 8.3 12.4* 12.1* 12.6* 13.7*  -Continue with supportive care -PT/OT evaluation ordered and recommending SNF but patient is refusing  -Right lower extremity venous Doppler ordered and showed no  DVT   Chronic Heart Failure with preserved ejection fraction (HFpEF) (HCC) Recent echo with normal EF and indeterminate diastolic function.  Recent admission secondary to CHF exacerbation. -Avoid excessive IV fluid -Continue home Coreg , Crestor , Aldactone  and torsemide  and Jardiance . Strict I's and O's and daily Weights. Will give an additional dose of IV Furosemide  40 mg x1 -CXR as below  Dyspnea and Wheezing: Checked CXR and it showed Cardiomegaly with mild vascular congestion slightly improved since the prior radiograph. No consolidative changes. No pleural effusion or pneumothorax. No acute osseous pathology. -Add DuoNebs scheduled q6h, Flutter Valve, Incentive Spirometry, and Guaifenesin  1200 mg po BID; C/w Albuterol  Nebs q2hprn -Repeat CXR in the AM   Hyperkalemia: K+ was 5.2 last evening. Recheck Ordered and pending; Getting a dose of IV lasix . If still elevated on re-check will give a 10 g Dose of Lokelma; May need to Hold Spironolactone     Essential Hypertension: Blood pressure currently within goal. -Continuing home Coreg , Aldactone  and torsemide . CTM BP per Protocol. Last BP reading was 128/47   Paroxysmal Atrial Fibrillation [facility]): Per documentation patient has declined DOAC in the past.  Also has history of GI bleed. Continue with Carvedilol  3.125 mg po BID   Type 2 Diabetes Mellitus (HCC): Seems well-controlled with CBG within goal and most recent A1c of 6.6 this hospitalization. Continue with SSI. CTM CBGs per protocol. CBG Trend:  Recent Labs  Lab 08/15/24 0754 08/15/24 1147 08/15/24 1641 08/15/24 2026 08/16/24 0802 08/16/24 1127 08/16/24 1556  GLUCAP 104* 109* 110* 96 95 160* 78    Renal Insufficiency: Fluctuating BUN/Cr Trend: Recent Labs  Lab 07/30/24 0331 07/31/24 1218 08/01/24 0422 08/13/24 0520 08/13/24  2231 08/14/24 2115 08/15/24 2102  BUN 16 15 19 14 16 20 19   CREATININE 1.01 1.10 1.29* 1.11 1.05 1.29* 1.29*  -Avoid Nephrotoxic Medications, Contrast  Dyes, Hypotension and Dehydration to Ensure Adequate Renal Perfusion and will need to Renally Adjust Meds -Continue to Monitor and Trend Renal Function carefully and repeat CMP in the AM   Dyslipidemia: Continue with home Rosuvastatin  20 mg po qHS   Depression with Anxiety:  Continue home Queitapine 400 mg po qHS and 2 mg po Clonazepam  BIDprn   Normocytic Anemia: Hgb/Hct Trend:  Recent Labs  Lab 07/21/24 0038 07/28/24 1525 07/30/24 0331 08/13/24 0520 08/13/24 2231 08/14/24 2115 08/15/24 2102  HGB 13.0 12.9* 11.8* 12.1* 11.9* 11.4* 12.1*  HCT 42.0 42.7 38.4* 38.3* 38.3* 37.7* 39.6  MCV 86.2 87.5 84.6 85.1 85.5 85.9 85.5  -Check Anemia Panel in the AM  GERD (gastroesophageal reflux disease)/GI Prophylaxis:  Continue PPI w/ Pantoprazole  40 mg po Daily    OSA (obstructive sleep apnea):  CPAP at night  Pressure Ulcer:  Wound 08/15/24 Pressure Injury Buttocks Left Stage 3 -  Full thickness tissue loss. Subcutaneous fat may be visible but bone, tendon or muscle are NOT exposed. (Active)    Class III (Morbid) Obesity: Complicates overall prognosis and care. Estimated body mass index is 48.2 kg/m as calculated from the following:   Height as of this encounter: 6' 2 (1.88 m).   Weight as of this encounter: 170.3 kg. Weight Loss and Dietary Counseling given   DVT prophylaxis: Enoxaparin  80 mg sq q24h    Code Status: Full Code Family Communication: No family present @ bedside   Disposition Plan:  Level of care: Med-Surg Status is: Inpatient Remains inpatient appropriate because: Needs further clinical improvement    Consultants:  As delineated as above  Procedures:  As delineated as above  Antimicrobials:  Anti-infectives (From admission, onward)    Start     Dose/Rate Route Frequency Ordered Stop   08/15/24 2200  linezolid  (ZYVOX ) IVPB 600 mg        600 mg 300 mL/hr over 60 Minutes Intravenous Every 12 hours 08/15/24 2024     08/15/24 2200  ceFAZolin  (ANCEF ) IVPB 2g/100  mL premix        2 g 200 mL/hr over 30 Minutes Intravenous Every 8 hours 08/15/24 2029     08/15/24 1000  vancomycin  (VANCOREADY) IVPB 2000 mg/400 mL  Status:  Discontinued        2,000 mg 200 mL/hr over 120 Minutes Intravenous Every 24 hours 08/15/24 0828 08/15/24 2024   08/14/24 1000  vancomycin  (VANCOCIN ) 2,500 mg in sodium chloride  0.9 % 500 mL IVPB  Status:  Discontinued        2,500 mg 262.5 mL/hr over 120 Minutes Intravenous Every 24 hours 08/13/24 0826 08/15/24 0828   08/13/24 0815  cefTRIAXone  (ROCEPHIN ) 2 g in sodium chloride  0.9 % 100 mL IVPB  Status:  Discontinued        2 g 200 mL/hr over 30 Minutes Intravenous Every 24 hours 08/13/24 0808 08/15/24 2024   08/13/24 0800  vancomycin  (VANCOCIN ) 2,500 mg in sodium chloride  0.9 % 500 mL IVPB        2,500 mg 262.5 mL/hr over 120 Minutes Intravenous  Once 08/13/24 0748 08/13/24 1044   08/13/24 0545  cefTRIAXone  (ROCEPHIN ) 2 g in sodium chloride  0.9 % 100 mL IVPB        2 g 200 mL/hr over 30 Minutes Intravenous  Once 08/13/24 0542 08/13/24 9376  Subjective: Examined at bedside he was resting but was complaining of some shortness of breath this morning.  States that he is wheezing and felt dyspneic.  No nausea or vomiting.  Thinks his leg is getting a little bit better.  No other concerns or complaints at this time.  Objective: Vitals:   08/15/24 1309 08/15/24 2024 08/16/24 0522 08/16/24 1329  BP: 136/74 (!) 144/72 (!) 140/51 (!) 128/47  Pulse: 82 97 100 89  Resp: 15 16 16 18   Temp: 98.4 F (36.9 C) 99.7 F (37.6 C) 99.1 F (37.3 C) 98.6 F (37 C)  TempSrc:  Oral Oral   SpO2: 96% 91% 90% 95%  Weight:      Height:        Intake/Output Summary (Last 24 hours) at 08/16/2024 1913 Last data filed at 08/16/2024 1752 Gross per 24 hour  Intake 1660 ml  Output 5750 ml  Net -4090 ml   Filed Weights   08/15/24 0732  Weight: (!) 170.3 kg   Examination: Physical Exam:  Constitutional: WN/WD elderly morbidly obese  African-American male who is in no acute distress Respiratory: Diminished to auscultation bilaterally with some coarse breath sounds and has some slight wheezing and some crackles but no appreciable rales or rhonchi. Has a normal respiratory effort and patient is not tachypenic. No accessory muscle use.  Not wearing supplemental oxygen nasal cannula Cardiovascular: RRR, no murmurs / rubs / gallops. S1 and S2 auscultated.  Is 1+ extremity edema worse on the right compared to left Abdomen: Soft, non-tender, distended secondary to body habitus bowel sounds positive.  GU: Deferred. Musculoskeletal: No clubbing / cyanosis of digits/nails. No joint deformity upper and lower extremities.  Skin: Right leg is not as swollen or erythematous today.  Is warm and seems to be improving  Neurologic: CN 2-12 grossly intact with no focal deficits. Romberg sign and cerebellar reflexes not assessed.  Psychiatric: Normal judgment and insight. Alert and oriented x 3. Normal mood and appropriate affect.   Data Reviewed: I have personally reviewed following labs and imaging studies  CBC: Recent Labs  Lab 08/13/24 0520 08/13/24 2231 08/14/24 2115 08/15/24 2102  WBC 12.4* 12.1* 12.6* 13.7*  NEUTROABS  --  9.4*  --  11.0*  HGB 12.1* 11.9* 11.4* 12.1*  HCT 38.3* 38.3* 37.7* 39.6  MCV 85.1 85.5 85.9 85.5  PLT 254 234 250 259   Basic Metabolic Panel: Recent Labs  Lab 08/13/24 0520 08/13/24 2231 08/14/24 2115 08/15/24 2102  NA 141 139 138 139  K 4.0 4.5 4.4 5.2*  CL 102 102 100 100  CO2 32 29 29 29   GLUCOSE 113* 112* 115* 102*  BUN 14 16 20 19   CREATININE 1.11 1.05 1.29* 1.29*  CALCIUM  9.2 8.8* 8.7* 8.9  MG  --   --   --  2.2  PHOS  --   --   --  3.6   GFR: Estimated Creatinine Clearance: 82.2 mL/min (A) (by C-G formula based on SCr of 1.29 mg/dL (H)). Liver Function Tests: Recent Labs  Lab 08/13/24 0520 08/15/24 2102  AST 20 20  ALT 16 17  ALKPHOS 72 70  BILITOT 0.4 0.3  PROT 7.2 7.3   ALBUMIN 3.9 3.5   No results for input(s): LIPASE, AMYLASE in the last 168 hours. No results for input(s): AMMONIA in the last 168 hours. Coagulation Profile: No results for input(s): INR, PROTIME in the last 168 hours. Cardiac Enzymes: No results for input(s): CKTOTAL, CKMB, CKMBINDEX, TROPONINI in the  last 168 hours. BNP (last 3 results) Recent Labs    07/21/24 0039 07/28/24 1525  PROBNP 351.0* 180.0   HbA1C: No results for input(s): HGBA1C in the last 72 hours. CBG: Recent Labs  Lab 08/15/24 1641 08/15/24 2026 08/16/24 0802 08/16/24 1127 08/16/24 1556  GLUCAP 110* 96 95 160* 78   Lipid Profile: No results for input(s): CHOL, HDL, LDLCALC, TRIG, CHOLHDL, LDLDIRECT in the last 72 hours. Thyroid Function Tests: No results for input(s): TSH, T4TOTAL, FREET4, T3FREE, THYROIDAB in the last 72 hours. Anemia Panel: No results for input(s): VITAMINB12, FOLATE, FERRITIN, TIBC, IRON, RETICCTPCT in the last 72 hours. Sepsis Labs: Recent Labs  Lab 08/13/24 1035  LATICACIDVEN 0.6   Recent Results (from the past 240 hours)  Culture, blood (Routine X 2) w Reflex to ID Panel     Status: None (Preliminary result)   Collection Time: 08/13/24  5:26 AM   Specimen: BLOOD  Result Value Ref Range Status   Specimen Description   Final    BLOOD LEFT ANTECUBITAL Performed at [facility], [address], [place], [place] [address]    Special Requests   Final    BOTTLES DRAWN AEROBIC AND ANAEROBIC Blood Culture results may not be optimal due to an inadequate volume of blood received in culture bottles Performed at [facility], [address], [place], [place] [address]    Culture   Final    NO GROWTH 3 DAYS Performed at [facility], [address] [place], [place] [address]    Report Status PENDING  Incomplete  Culture, blood (Routine X 2) w Reflex to ID Panel     Status: None  (Preliminary result)   Collection Time: 08/13/24  5:37 AM   Specimen: BLOOD  Result Value Ref Range Status   Specimen Description   Final    BLOOD RIGHT ANTECUBITAL Performed at [facility], [address] [place], [place] [address]    Special Requests   Final    BOTTLES DRAWN AEROBIC ONLY Blood Culture results may not be optimal due to an inadequate volume of blood received in culture bottles Performed at [facility], [address] [place], [place] [address]    Culture   Final    NO GROWTH 3 DAYS Performed at [facility], [address], [place], [place] [address]    Report Status PENDING  Incomplete  Resp panel by RT-PCR (RSV, Flu A&B, Covid) Anterior Nasal Swab     Status: None   Collection Time: 08/13/24 10:49 PM   Specimen: Anterior Nasal Swab  Result Value Ref Range Status   SARS Coronavirus 2 by RT PCR NEGATIVE NEGATIVE Final    Comment: (NOTE) SARS-CoV-2 target nucleic acids are NOT DETECTED.  The SARS-CoV-2 RNA is generally detectable in upper respiratory specimens during the acute phase of infection. The lowest concentration of SARS-CoV-2 viral copies this assay can detect is 138 copies/mL. A negative result does not preclude SARS-Cov-2 infection and should not be used as the sole basis for treatment or other patient management decisions. A negative result may occur with  improper specimen collection/handling, submission of specimen other than nasopharyngeal swab, presence of viral mutation(s) within the areas targeted by this assay, and inadequate number of viral copies(<138 copies/mL). A negative result must be combined with clinical observations, patient history, and epidemiological information. The expected result is Negative.  Fact Sheet for Patients:  bloggercourse.com  Fact Sheet for Healthcare Providers:  seriousbroker.it  This test is no t yet approved or  cleared by the [place]  FDA and  has been authorized for detection and/or diagnosis of SARS-CoV-2 by FDA under an Emergency Use Authorization (EUA). This EUA will remain  in effect (meaning this test can be used) for the duration of the COVID-19 declaration under Section 564(b)(1) of the Act, 21 U.S.C.section 360bbb-3(b)(1), unless the authorization is terminated  or revoked sooner.       Influenza A by PCR NEGATIVE NEGATIVE Final   Influenza B by PCR NEGATIVE NEGATIVE Final    Comment: (NOTE) The Xpert Xpress SARS-CoV-2/FLU/RSV plus assay is intended as an aid in the diagnosis of influenza from Nasopharyngeal swab specimens and should not be used as a sole basis for treatment. Nasal washings and aspirates are unacceptable for Xpert Xpress SARS-CoV-2/FLU/RSV testing.  Fact Sheet for Patients: bloggercourse.com  Fact Sheet for Healthcare Providers: seriousbroker.it  This test is not yet approved or cleared by the [place]  FDA and has been authorized for detection and/or diagnosis of SARS-CoV-2 by FDA under an Emergency Use Authorization (EUA). This EUA will remain in effect (meaning this test can be used) for the duration of the COVID-19 declaration under Section 564(b)(1) of the Act, 21 U.S.C. section 360bbb-3(b)(1), unless the authorization is terminated or revoked.     Resp Syncytial Virus by PCR NEGATIVE NEGATIVE Final    Comment: (NOTE) Fact Sheet for Patients: bloggercourse.com  Fact Sheet for Healthcare Providers: seriousbroker.it  This test is not yet approved or cleared by the [place]  FDA and has been authorized for detection and/or diagnosis of SARS-CoV-2 by FDA under an Emergency Use Authorization (EUA). This EUA will remain in effect (meaning this test can be used) for the duration of the COVID-19 declaration under Section 564(b)(1) of the Act, 21  U.S.C. section 360bbb-3(b)(1), unless the authorization is terminated or revoked.  Performed at [facility], [address], [place], [place] [address]     Radiology Studies: DG CHEST PORT 1 VIEW Result Date: 08/16/2024 CLINICAL DATA:  Shortness of breath. EXAM: PORTABLE CHEST 1 VIEW COMPARISON:  Chest [place]  dated 07/28/2024. FINDINGS: Cardiomegaly with mild vascular congestion slightly improved since the prior radiograph. No consolidative changes. No pleural effusion or pneumothorax. No acute osseous pathology. IMPRESSION: Cardiomegaly with mild vascular congestion. Electronically Signed   By: Vanetta Chou M.D.   On: 08/16/2024 13:33   VAS US  LOWER EXTREMITY VENOUS (DVT) Result Date: 08/15/2024  Lower Venous DVT Study Patient Name:  Christopher Riley  Date of Exam:   08/15/2024 Medical Rec #: [id number]         Accession #:    [id number] Date of Birth: Nov 16, 1950         Patient Gender: M Patient Age:   56 years Exam Location:  [facility] Procedure:      VAS US  LOWER EXTREMITY VENOUS (DVT) Referring Phys: SUMAYYA AMIN --------------------------------------------------------------------------------  Indications: Swelling, and Edema.  Comparison Study: Previous study of the right lower extremity on 11.12.2025. Performing Technologist: Edilia Elden Appl  Examination Guidelines: A complete evaluation includes B-mode imaging, spectral Doppler, color Doppler, and power Doppler as needed of all accessible portions of each vessel. Bilateral testing is considered an integral part of a complete examination. Limited examinations for reoccurring indications may be performed as noted. The reflux portion of the exam is performed with the patient in reverse Trendelenburg.  +---------+---------------+---------+-----------+----------+-------------------+ RIGHT    CompressibilityPhasicitySpontaneityPropertiesThrombus Aging       +---------+---------------+---------+-----------+----------+-------------------+ CFV  Full           Yes      Yes                                      +---------+---------------+---------+-----------+----------+-------------------+ SFJ      Full           Yes      Yes                                      +---------+---------------+---------+-----------+----------+-------------------+ FV Prox  Full                                                             +---------+---------------+---------+-----------+----------+-------------------+ FV Mid   Full           No       Yes                  Limited             +---------+---------------+---------+-----------+----------+-------------------+ FV DistalFull           Yes      Yes                                      +---------+---------------+---------+-----------+----------+-------------------+ PFV      Full                                                             +---------+---------------+---------+-----------+----------+-------------------+ POP      Full           Yes      Yes                                      +---------+---------------+---------+-----------+----------+-------------------+ PTV      Full                                                             +---------+---------------+---------+-----------+----------+-------------------+ PERO                                                  Not well  visualized, patent                                                        by color.           +---------+---------------+---------+-----------+----------+-------------------+   +----+---------------+---------+-----------+----------+--------------+ LEFTCompressibilityPhasicitySpontaneityPropertiesThrombus Aging +----+---------------+---------+-----------+----------+--------------+ CFV Full           Yes       Yes                                 +----+---------------+---------+-----------+----------+--------------+ SFJ Full           Yes      Yes                                 +----+---------------+---------+-----------+----------+--------------+     Summary: RIGHT: - There is no evidence of deep vein thrombosis in the lower extremity. However, portions of this examination were limited- see technologist comments above.  - No cystic structure found in the popliteal fossa.  LEFT: - No evidence of common femoral vein obstruction.   *See table(s) above for measurements and observations. Electronically signed by Norman Serve on 08/15/2024 at 1:12:12 PM.    Final    Scheduled Meds:  carvedilol   3.125 mg Oral BID WC   collagenase    Topical Daily   empagliflozin   10 mg Oral Daily   enoxaparin  (LOVENOX ) injection  80 mg Subcutaneous Q24H   furosemide   40 mg Intravenous Once   guaiFENesin   1,200 mg Oral BID   insulin  aspart  0-15 Units Subcutaneous TID WC   insulin  aspart  0-5 Units Subcutaneous QHS   ipratropium-albuterol   3 mL Nebulization Q6H   latanoprost   1 drop Both Eyes QHS   pantoprazole   40 mg Oral Daily   QUEtiapine   400 mg Oral QHS   rosuvastatin   20 mg Oral QHS   spironolactone   25 mg Oral Daily   torsemide   20 mg Oral Daily   Continuous Infusions:   ceFAZolin  (ANCEF ) IV 2 g (08/16/24 1345)   linezolid  (ZYVOX ) IV Stopped (08/16/24 0954)    LOS: 3 days   Alejandro Marker, DO [facility] Available via Epic secure chat 7am-7pm After these hours, please refer to coverage provider listed on [email or web address] 08/16/2024, 7:13 PM  "

## 2024-08-17 LAB — CBC WITH DIFFERENTIAL/PLATELET
Abs Immature Granulocytes: 0.09 K/uL — ABNORMAL HIGH (ref 0.00–0.07)
Basophils Absolute: 0 K/uL (ref 0.0–0.1)
Basophils Relative: 0 %
Eosinophils Absolute: 0.3 K/uL (ref 0.0–0.5)
Eosinophils Relative: 3 %
HCT: 40.3 % (ref 39.0–52.0)
Hemoglobin: 13 g/dL (ref 13.0–17.0)
Immature Granulocytes: 1 %
Lymphocytes Relative: 10 %
Lymphs Abs: 0.9 K/uL (ref 0.7–4.0)
MCH: 26.9 pg (ref 26.0–34.0)
MCHC: 32.3 g/dL (ref 30.0–36.0)
MCV: 83.4 fL (ref 80.0–100.0)
Monocytes Absolute: 1.2 K/uL — ABNORMAL HIGH (ref 0.1–1.0)
Monocytes Relative: 13 %
Neutro Abs: 6.9 K/uL (ref 1.7–7.7)
Neutrophils Relative %: 73 %
Platelets: 262 K/uL (ref 150–400)
RBC: 4.83 MIL/uL (ref 4.22–5.81)
RDW: 14.4 % (ref 11.5–15.5)
WBC: 9.5 K/uL (ref 4.0–10.5)
nRBC: 0 % (ref 0.0–0.2)

## 2024-08-17 LAB — COMPREHENSIVE METABOLIC PANEL WITH GFR
ALT: 15 U/L (ref 0–44)
AST: 25 U/L (ref 15–41)
Albumin: 3.4 g/dL — ABNORMAL LOW (ref 3.5–5.0)
Alkaline Phosphatase: 71 U/L (ref 38–126)
Anion gap: 12 (ref 5–15)
BUN: 18 mg/dL (ref 8–23)
CO2: 29 mmol/L (ref 22–32)
Calcium: 8.9 mg/dL (ref 8.9–10.3)
Chloride: 97 mmol/L — ABNORMAL LOW (ref 98–111)
Creatinine, Ser: 1.23 mg/dL (ref 0.61–1.24)
GFR, Estimated: >60 mL/min
Glucose, Bld: 109 mg/dL — ABNORMAL HIGH (ref 70–99)
Potassium: 4.4 mmol/L (ref 3.5–5.1)
Sodium: 138 mmol/L (ref 135–145)
Total Bilirubin: 0.3 mg/dL (ref 0.0–1.2)
Total Protein: 7.5 g/dL (ref 6.5–8.1)

## 2024-08-17 LAB — MAGNESIUM: Magnesium: 2.4 mg/dL (ref 1.7–2.4)

## 2024-08-17 LAB — GLUCOSE, CAPILLARY
Glucose-Capillary: 136 mg/dL — ABNORMAL HIGH (ref 70–99)
Glucose-Capillary: 98 mg/dL (ref 70–99)
Glucose-Capillary: 123 mg/dL — ABNORMAL HIGH (ref 70–99)

## 2024-08-17 LAB — PHOSPHORUS: Phosphorus: 3.4 mg/dL (ref 2.5–4.6)

## 2024-08-17 MED ORDER — FUROSEMIDE 10 MG/ML IJ SOLN
40.0000 mg | Freq: Once | INTRAMUSCULAR | Status: AC
  Administered 2024-08-17: 40 mg via INTRAVENOUS
  Filled 2024-08-17: qty 4

## 2024-08-17 NOTE — Plan of Care (Signed)
   Problem: Fluid Volume: Goal: Ability to maintain a balanced intake and output will improve Outcome: Progressing

## 2024-08-17 NOTE — Progress Notes (Signed)
 Mobility Specialist - Progress Note:   08/17/24 1643  Mobility  Activity Mechanically lifted from chair to bed  Level of Assistance +2 (takes two people)  Assistive Device MaxiMove  Activity Response Tolerated well  Mobility visit 1 Mobility  Mobility Specialist Start Time (ACUTE ONLY) 1614  Mobility Specialist Stop Time (ACUTE ONLY) 1640  Mobility Specialist Time Calculation (min) (ACUTE ONLY) 26 min   RN requested assistance to help Pt back to bed. Returned to bed with all needs met. Call bell in reach.  [employer] - Mobility Specialist - Acute Rehabilitation Can be reached via [employer]

## 2024-08-17 NOTE — Plan of Care (Signed)

## 2024-08-17 NOTE — Progress Notes (Signed)
" °   08/17/24 0030  BiPAP/CPAP/SIPAP  BiPAP/CPAP/SIPAP Pt Type Adult  BiPAP/CPAP/SIPAP DREAMSTATIOND  Mask Type Full face mask  Dentures removed? Not applicable  Mask Size Large  Respiratory Rate 18 breaths/min  Patient Home Machine No  Patient Home Mask No  Patient Home Tubing No  Auto Titrate Yes  Minimum cmH2O 5 cmH2O  Maximum cmH2O 20 cmH2O  Device Plugged into RED Power Outlet Yes    "

## 2024-08-17 NOTE — Progress Notes (Signed)
 " PROGRESS NOTE    Christopher Riley  FMW:[id number] DOB: 21-Mar-1951 DOA: 08/13/2024 PCP: Claude Reagin, NP   Brief Narrative:  Christopher Riley is a 74 y.o. male with medical history significant for heart failure with reduced EF not recovered, DM2, hypertension, OSA, morbid obesity and stasis dermatitis with sacral decubitus ulcer being admitted to the hospital with leg pain due to right lower extremity cellulitis.  Patient was just discharged from the hospital on 1/7 after a stay for acute left-sided heart failure, diuresed successfully and discharged back home.   Workup in ED with leukocytosis, CT scan consistent with leg edema and cellulitis.  Blood cultures were drawn and patient was started on Rocephin  and vancomycin .  1/20: Patient became febrile last night up to 103, preliminary blood cultures negative.  CTA abdomen was negative for any significant stenosis.  More consistent with cellulitis and inguinal lymphadenopathy.  1/21: De-escalating Abx to IV Cefazolin  and Linezolid .   1/22: Had some Dyspnea and CXR showed mild cardiovascular congestion so will give a Dose of IV Lasix  in addition to Torsemide .   1/23: Leg is improving on antibiotics.  Will give an additional dose of IV Lasix  today.  Assessment and Plan:  Sepsis due to cellulitis [facility]): Patient met sepsis criteria with fever, leukocytosis and tachycardia.  Likely secondary to right lower extremity cellulitis as evident on imaging.  Preliminary blood cultures negative. Continued with IV ceftriaxone  and vancomycin  but will change to Cefazolin  and Linezolid ; Leg is slowly improving daily and not as erythematous  -Will need to be OOB and Elevate Extremity  -WBC Trend remains elevated:  Recent Labs  Lab 07/28/24 1525 07/30/24 0331 08/13/24 0520 08/13/24 2231 08/14/24 2115 08/15/24 2102 08/17/24 0506  WBC 8.5 8.3 12.4* 12.1* 12.6* 13.7* 9.5  -Continue with supportive care -PT/OT evaluation ordered and recommending SNF but  patient is refusing but he is not mobilizing well after DC home at this time. -Right lower extremity venous Doppler ordered and showed no DVT   Chronic Heart Failure with preserved ejection fraction (HFpEF) (HCC) Recent echo with normal EF and indeterminate diastolic function.  Recent admission secondary to CHF exacerbation. -Avoid excessive IV fluid -Continue home Coreg , Crestor , Aldactone  and torsemide  and Jardiance . Strict I's and O's and daily Weights. Will give an additional dose of IV Furosemide  40 mg x1 again today -CXR as below  Dyspnea and Wheezing:, Improving checked CXR and it showed Cardiomegaly with mild vascular congestion slightly improved since the prior radiograph. No consolidative changes. No pleural effusion or pneumothorax. No acute osseous pathology. -Add DuoNebs scheduled q6h, Flutter Valve, Incentive Spirometry, and Guaifenesin  1200 mg po BID; C/w Albuterol  Nebs q2hprn -Repeat CXR in the AM   Hyperkalemia: K+ was 5.2 last evening. Recheck Ordered and pending; Getting a dose of IV lasix . If still elevated on re-check will give a 10 g Dose of Lokelma; May need to Hold Spironolactone     Essential Hypertension: Blood pressure currently within goal. -Continuing home Coreg , Aldactone  and torsemide . CTM BP per Protocol. Last BP reading was a little soft at 99/50   Paroxysmal Atrial Fibrillation [facility]): Per documentation patient has declined DOAC in the past.  Also has history of GI bleed. Continue with Carvedilol  3.125 mg po BID   Type 2 Diabetes Mellitus (HCC): Seems well-controlled with CBG within goal and most recent A1c of 6.6 this hospitalization. Continue with SSI. CTM CBGs per protocol. CBG Trend:  Recent Labs  Lab 08/15/24 2026 08/16/24 0802 08/16/24 1127 08/16/24 1556 08/16/24 2215  08/17/24 0808 08/17/24 1309  GLUCAP 96 95 160* 78 135* 136* 98    Renal Insufficiency: Fluctuating BUN/Cr Trend: Recent Labs  Lab 07/31/24 1218 08/01/24 0422 08/13/24 0520  08/13/24 2231 08/14/24 2115 08/15/24 2102 08/17/24 0506  BUN 15 19 14 16 20 19 18   CREATININE 1.10 1.29* 1.11 1.05 1.29* 1.29* 1.23  -Avoid Nephrotoxic Medications, Contrast Dyes, Hypotension and Dehydration to Ensure Adequate Renal Perfusion and will need to Renally Adjust Meds -Continue to Monitor and Trend Renal Function carefully and repeat CMP in the AM   Dyslipidemia: Continue with home Rosuvastatin  20 mg po qHS   Depression with Anxiety:  Continue home Queitapine 400 mg po qHS and 2 mg po Clonazepam  BIDprn   Normocytic Anemia: Hgb/Hct Trend:  Recent Labs  Lab 07/28/24 1525 07/30/24 0331 08/13/24 0520 08/13/24 2231 08/14/24 2115 08/15/24 2102 08/17/24 0506  HGB 12.9* 11.8* 12.1* 11.9* 11.4* 12.1* 13.0  HCT 42.7 38.4* 38.3* 38.3* 37.7* 39.6 40.3  MCV 87.5 84.6 85.1 85.5 85.9 85.5 83.4  -Check Anemia Panel in the AM  GERD (gastroesophageal reflux disease)/GI Prophylaxis:  Continue PPI w/ Pantoprazole  40 mg po Daily    OSA (obstructive sleep apnea):  CPAP at night  Hypoalbuminemia: Patient's Albumin Trend: Recent Labs  Lab 08/13/24 0520 08/15/24 2102 08/17/24 0506  ALBUMIN 3.9 3.5 3.4*  -Continue to Monitor and Trend and repeat CMP in the AM  Pressure Ulcer:  Wound 08/15/24 Pressure Injury Buttocks Left Stage 3 -  Full thickness tissue loss. Subcutaneous fat may be visible but bone, tendon or muscle are NOT exposed. (Active)    Class III (Morbid) Obesity: Complicates overall prognosis and care. Estimated body mass index is 48.2 kg/m as calculated from the following:   Height as of this encounter: 6' 2 (1.88 m).   Weight as of this encounter: 170.3 kg. Weight Loss and Dietary Counseling given   DVT prophylaxis: Enoxaparin  80 mg po sq q24h    Code Status: Full Code Family Communication: D/w Sister over the telephone  Disposition Plan:  Level of care: Med-Surg Status is: Inpatient Remains inpatient appropriate because: Needs further clinical improvement in     Consultants:  None  Procedures:  As delineated as above  Antimicrobials:  Anti-infectives (From admission, onward)    Start     Dose/Rate Route Frequency Ordered Stop   08/15/24 2200  linezolid  (ZYVOX ) IVPB 600 mg        600 mg 300 mL/hr over 60 Minutes Intravenous Every 12 hours 08/15/24 2024     08/15/24 2200  ceFAZolin  (ANCEF ) IVPB 2g/100 mL premix        2 g 200 mL/hr over 30 Minutes Intravenous Every 8 hours 08/15/24 2029     08/15/24 1000  vancomycin  (VANCOREADY) IVPB 2000 mg/400 mL  Status:  Discontinued        2,000 mg 200 mL/hr over 120 Minutes Intravenous Every 24 hours 08/15/24 0828 08/15/24 2024   08/14/24 1000  vancomycin  (VANCOCIN ) 2,500 mg in sodium chloride  0.9 % 500 mL IVPB  Status:  Discontinued        2,500 mg 262.5 mL/hr over 120 Minutes Intravenous Every 24 hours 08/13/24 0826 08/15/24 0828   08/13/24 0815  cefTRIAXone  (ROCEPHIN ) 2 g in sodium chloride  0.9 % 100 mL IVPB  Status:  Discontinued        2 g 200 mL/hr over 30 Minutes Intravenous Every 24 hours 08/13/24 0808 08/15/24 2024   08/13/24 0800  vancomycin  (VANCOCIN ) 2,500 mg in sodium chloride   0.9 % 500 mL IVPB        2,500 mg 262.5 mL/hr over 120 Minutes Intravenous  Once 08/13/24 0748 08/13/24 1044   08/13/24 0545  cefTRIAXone  (ROCEPHIN ) 2 g in sodium chloride  0.9 % 100 mL IVPB        2 g 200 mL/hr over 30 Minutes Intravenous  Once 08/13/24 0542 08/13/24 9376       Subjective: Seen and examined at bedside they said breathing is doing little bit better.  Also thinks his leg is little bit better.  Not as warm.  States able to palpate today as it was not as painful.  No nausea or vomiting.  No other concerns or complaints this time.  PT OT still recommending SNF but adamantly refusing.  Objective: Vitals:   08/17/24 0500 08/17/24 0902 08/17/24 1122 08/17/24 1516  BP: (!) 144/72  (!) 99/50   Pulse: 92  85   Resp: 18  16   Temp: 98.9 F (37.2 C)  97.9 F (36.6 C)   TempSrc: Axillary     SpO2:  92% 92% 96% 94%  Weight:      Height:        Intake/Output Summary (Last 24 hours) at 08/17/2024 1925 Last data filed at 08/17/2024 1400 Gross per 24 hour  Intake 2173.11 ml  Output 2550 ml  Net -376.89 ml   Filed Weights   08/15/24 0732  Weight: (!) 170.3 kg   Examination: Physical Exam:  Constitutional: Elderly morbidly obese African-American male who is resting Respiratory: Diminished to auscultation bilaterally with some coarse breath sounds and has some slight crackles and mild rhonchi but no peripheral wheezing or rales. Normal respiratory effort and patient is not tachypenic. No accessory muscle use.  Not wearing supplemental oxygen nasal cannula Cardiovascular: RRR, no murmurs / rubs / gallops. S1 and S2 auscultated.  1+ lower extremity edema worse on the right compared to left Abdomen: Soft, non-tender, stented secondary to body habitus bowel sounds positive.  GU: Deferred. Musculoskeletal: No clubbing / cyanosis of digits/nails. No joint deformity upper and lower extremities.  Skin: No rashes, lesions, ulcers on limited skin evaluation and right leg is erythematous.  Did not turn to see his buttock pressure ulcer.. No induration; Warm and dry.  Neurologic: CN 2-12 grossly intact with no focal deficits. Romberg sign and cerebellar reflexes not assessed.  Psychiatric: Normal judgment and insight. Alert and oriented x 3. Normal mood and appropriate affect.   Data Reviewed: I have personally reviewed following labs and imaging studies  CBC: Recent Labs  Lab 08/13/24 0520 08/13/24 2231 08/14/24 2115 08/15/24 2102 08/17/24 0506  WBC 12.4* 12.1* 12.6* 13.7* 9.5  NEUTROABS  --  9.4*  --  11.0* 6.9  HGB 12.1* 11.9* 11.4* 12.1* 13.0  HCT 38.3* 38.3* 37.7* 39.6 40.3  MCV 85.1 85.5 85.9 85.5 83.4  PLT 254 234 250 259 262   Basic Metabolic Panel: Recent Labs  Lab 08/13/24 0520 08/13/24 2231 08/14/24 2115 08/15/24 2102 08/17/24 0506  NA 141 139 138 139 138  K 4.0 4.5  4.4 5.2* 4.4  CL 102 102 100 100 97*  CO2 32 29 29 29 29   GLUCOSE 113* 112* 115* 102* 109*  BUN 14 16 20 19 18   CREATININE 1.11 1.05 1.29* 1.29* 1.23  CALCIUM  9.2 8.8* 8.7* 8.9 8.9  MG  --   --   --  2.2 2.4  PHOS  --   --   --  3.6 3.4   GFR:  Estimated Creatinine Clearance: 86.2 mL/min (by C-G formula based on SCr of 1.23 mg/dL). Liver Function Tests: Recent Labs  Lab 08/13/24 0520 08/15/24 2102 08/17/24 0506  AST 20 20 25   ALT 16 17 15   ALKPHOS 72 70 71  BILITOT 0.4 0.3 0.3  PROT 7.2 7.3 7.5  ALBUMIN 3.9 3.5 3.4*   No results for input(s): LIPASE, AMYLASE in the last 168 hours. No results for input(s): AMMONIA in the last 168 hours. Coagulation Profile: No results for input(s): INR, PROTIME in the last 168 hours. Cardiac Enzymes: No results for input(s): CKTOTAL, CKMB, CKMBINDEX, TROPONINI in the last 168 hours. BNP (last 3 results) Recent Labs    07/21/24 0039 07/28/24 1525  PROBNP 351.0* 180.0   HbA1C: No results for input(s): HGBA1C in the last 72 hours. CBG: Recent Labs  Lab 08/16/24 1127 08/16/24 1556 08/16/24 2215 08/17/24 0808 08/17/24 1309  GLUCAP 160* 78 135* 136* 98   Lipid Profile: No results for input(s): CHOL, HDL, LDLCALC, TRIG, CHOLHDL, LDLDIRECT in the last 72 hours. Thyroid Function Tests: No results for input(s): TSH, T4TOTAL, FREET4, T3FREE, THYROIDAB in the last 72 hours. Anemia Panel: No results for input(s): VITAMINB12, FOLATE, FERRITIN, TIBC, IRON, RETICCTPCT in the last 72 hours. Sepsis Labs: Recent Labs  Lab 08/13/24 1035  LATICACIDVEN 0.6   Recent Results (from the past 240 hours)  Culture, blood (Routine X 2) w Reflex to ID Panel     Status: None (Preliminary result)   Collection Time: 08/13/24  5:26 AM   Specimen: BLOOD  Result Value Ref Range Status   Specimen Description   Final    BLOOD LEFT ANTECUBITAL Performed at [facility], [address], [place], [place] [address]    Special Requests   Final    BOTTLES DRAWN AEROBIC AND ANAEROBIC Blood Culture results may not be optimal due to an inadequate volume of blood received in culture bottles Performed at [facility], [address], [place], [place] [address]    Culture   Final    NO GROWTH 4 DAYS Performed at [facility], [address], [place], [place] [address]    Report Status PENDING  Incomplete  Culture, blood (Routine X 2) w Reflex to ID Panel     Status: None (Preliminary result)   Collection Time: 08/13/24  5:37 AM   Specimen: BLOOD  Result Value Ref Range Status   Specimen Description   Final    BLOOD RIGHT ANTECUBITAL Performed at [facility], [address], [place], [place] [address]    Special Requests   Final    BOTTLES DRAWN AEROBIC ONLY Blood Culture results may not be optimal due to an inadequate volume of blood received in culture bottles Performed at [facility], [address], [place], [place] [address]    Culture   Final    NO GROWTH 4 DAYS Performed at [facility], [address] [place], [place] [address]    Report Status PENDING  Incomplete  Resp panel by RT-PCR (RSV, Flu A&B, Covid) Anterior Nasal Swab     Status: None   Collection Time: 08/13/24 10:49 PM   Specimen: Anterior Nasal Swab  Result Value Ref Range Status   SARS Coronavirus 2 by RT PCR NEGATIVE NEGATIVE Final    Comment: (NOTE) SARS-CoV-2 target nucleic acids are NOT DETECTED.  The SARS-CoV-2 RNA is generally detectable in upper respiratory specimens during the acute phase of infection.  The lowest concentration of SARS-CoV-2 viral copies this assay can detect is 138 copies/mL. A negative result does not preclude SARS-Cov-2 infection and should not be used as the sole basis for treatment or other patient management decisions. A negative result may occur with  improper specimen  collection/handling, submission of specimen other than nasopharyngeal swab, presence of viral mutation(s) within the areas targeted by this assay, and inadequate number of viral copies(<138 copies/mL). A negative result must be combined with clinical observations, patient history, and epidemiological information. The expected result is Negative.  Fact Sheet for Patients:  bloggercourse.com  Fact Sheet for Healthcare Providers:  seriousbroker.it  This test is no t yet approved or cleared by the [place]  FDA and  has been authorized for detection and/or diagnosis of SARS-CoV-2 by FDA under an Emergency Use Authorization (EUA). This EUA will remain  in effect (meaning this test can be used) for the duration of the COVID-19 declaration under Section 564(b)(1) of the Act, 21 U.S.C.section 360bbb-3(b)(1), unless the authorization is terminated  or revoked sooner.       Influenza A by PCR NEGATIVE NEGATIVE Final   Influenza B by PCR NEGATIVE NEGATIVE Final    Comment: (NOTE) The Xpert Xpress SARS-CoV-2/FLU/RSV plus assay is intended as an aid in the diagnosis of influenza from Nasopharyngeal swab specimens and should not be used as a sole basis for treatment. Nasal washings and aspirates are unacceptable for Xpert Xpress SARS-CoV-2/FLU/RSV testing.  Fact Sheet for Patients: bloggercourse.com  Fact Sheet for Healthcare Providers: seriousbroker.it  This test is not yet approved or cleared by the [place]  FDA and has been authorized for detection and/or diagnosis of SARS-CoV-2 by FDA under an Emergency Use Authorization (EUA). This EUA will remain in effect (meaning this test can be used) for the duration of the COVID-19 declaration under Section 564(b)(1) of the Act, 21 U.S.C. section 360bbb-3(b)(1), unless the authorization is terminated or revoked.     Resp Syncytial  Virus by PCR NEGATIVE NEGATIVE Final    Comment: (NOTE) Fact Sheet for Patients: bloggercourse.com  Fact Sheet for Healthcare Providers: seriousbroker.it  This test is not yet approved or cleared by the [place]  FDA and has been authorized for detection and/or diagnosis of SARS-CoV-2 by FDA under an Emergency Use Authorization (EUA). This EUA will remain in effect (meaning this test can be used) for the duration of the COVID-19 declaration under Section 564(b)(1) of the Act, 21 U.S.C. section 360bbb-3(b)(1), unless the authorization is terminated or revoked.  Performed at [facility], [address] [place], [place] [address]    Radiology Studies: DG CHEST PORT 1 VIEW Result Date: 08/16/2024 CLINICAL DATA:  Shortness of breath. EXAM: PORTABLE CHEST 1 VIEW COMPARISON:  Chest [place]  dated 07/28/2024. FINDINGS: Cardiomegaly with mild vascular congestion slightly improved since the prior radiograph. No consolidative changes. No pleural effusion or pneumothorax. No acute osseous pathology. IMPRESSION: Cardiomegaly with mild vascular congestion. Electronically Signed   By: Vanetta Chou M.D.   On: 08/16/2024 13:33   Scheduled Meds:  carvedilol   3.125 mg Oral BID WC   collagenase    Topical Daily   empagliflozin   10 mg Oral Daily   enoxaparin  (LOVENOX ) injection  80 mg Subcutaneous Q24H   furosemide   40 mg Intravenous Once   guaiFENesin   1,200 mg Oral BID   insulin  aspart  0-15 Units Subcutaneous TID WC   insulin  aspart  0-5 Units Subcutaneous QHS   ipratropium-albuterol   3 mL Nebulization Q6H   latanoprost   1 drop Both Eyes QHS   pantoprazole   40 mg Oral Daily   QUEtiapine   400 mg Oral QHS   rosuvastatin   20 mg Oral QHS   spironolactone   25 mg Oral Daily   torsemide   20 mg Oral Daily   Continuous Infusions:   ceFAZolin  (ANCEF ) IV 2 g (08/17/24 1500)   linezolid  (ZYVOX ) IV 600 mg (08/17/24 0927)     LOS: 4 days   Alejandro Marker, DO [facility] Available via Epic secure chat 7am-7pm After these hours, please refer to coverage provider listed on [email or web address] 08/17/2024, 7:25 PM  "

## 2024-08-17 NOTE — Progress Notes (Signed)
 Physical Therapy Treatment Patient Details Name: Christopher Riley MRN: [id number] DOB: 03/05/1951 Today's Date: 08/17/2024   History of Present Illness 74 y.o. male with medical history significant for heart failure with reduced EF not recovered, DM2, hypertension, OSA, morbid obesity and stasis dermatitis with sacral decubitus ulcer being admitted to the hospital with leg pain due to right lower extremity cellulitis.  Patient was just discharged from the hospital on 1/7 after a stay for acute left-sided heart failure, diuresed successfully and discharged back home.    PT Comments  Pt is requiring significant assistance for mobility. Mod assist for supine to sit with HOB elevated and use of bedrail.  Min assist for sit to stand from elevated bed, then mod assist for step pivot transfer to recliner as pt attempted to sit prior to fully reaching recliner and required assist to guide hips into recliner. He is a high falls risk. He is not mobilizing well enough to DC home. Pt remains hopeful he can DC home. Patient will benefit from continued inpatient follow up therapy, <3 hours/day.    If plan is discharge home, recommend the following: A lot of help with walking and/or transfers;A lot of help with bathing/dressing/bathroom;Assistance with cooking/housework;Assist for transportation;Help with stairs or ramp for entrance   Can travel by private vehicle     No  Equipment Recommendations  None recommended by PT    Recommendations for Other Services       Precautions / Restrictions Precautions Precautions: Fall Recall of Precautions/Restrictions: Intact Precaution/Restrictions Comments: pt denies falls in the past 6 months Restrictions Weight Bearing Restrictions Per Provider Order: No     Mobility  Bed Mobility Overal bed mobility: Needs Assistance Bed Mobility: Supine to Sit     Supine to sit: Mod assist, HOB elevated, Used rails     General bed mobility comments: increased  time/effort, used gait belt as RLE lifter, HOB up, used rail; max verbal cues for technique. Assist to advance RLE and pivot hips to EOB.     Transfers Overall transfer level: Needs assistance Equipment used: Rollator (4 wheels) Transfers: Sit to/from Stand, Bed to chair/wheelchair/BSC Sit to Stand: From elevated surface, Min assist, +2 safety/equipment, +2 physical assistance   Step pivot transfers: Mod assist, +2 safety/equipment, +2 physical assistance       General transfer comment: pt able to take a few pivotal steps, trunk flexed, increased time/effort, sat before fully reaching recliner requiring mod A to guide hips to recliner. Lift pad placed under pt (RN and NT aware).    Ambulation/Gait                   Stairs             Wheelchair Mobility     Tilt Bed    Modified Rankin (Stroke Patients Only)       Balance                                            Communication Communication Communication: No apparent difficulties  Cognition Arousal: Alert Behavior During Therapy: WFL for tasks assessed/performed   PT - Cognitive impairments: No apparent impairments                         Following commands: Intact      Cueing Cueing Techniques: Verbal cues  Exercises  General Comments        Pertinent Vitals/Pain Pain Assessment Pain Score: 7  Pain Location: R leg when touched, left buttocks Pain Descriptors / Indicators: Discomfort, Grimacing Pain Intervention(s): Limited activity within patient's tolerance, Monitored during session, Patient requesting pain meds-RN notified, Repositioned    Home Living                          Prior Function            PT Goals (current goals can now be found in the care plan section) Acute Rehab PT Goals Patient Stated Goal: return home PT Goal Formulation: With patient Time For Goal Achievement: 08/29/24 Potential to Achieve Goals: Fair Progress  towards PT goals: Not progressing toward goals - comment (pain/fatigue/weakness limiting progress)    Frequency    Min 2X/week      PT Plan      Co-evaluation              AM-PAC PT 6 Clicks Mobility   Outcome Measure  Help needed turning from your back to your side while in a flat bed without using bedrails?: A Lot Help needed moving from lying on your back to sitting on the side of a flat bed without using bedrails?: Total Help needed moving to and from a bed to a chair (including a wheelchair)?: A Lot Help needed standing up from a chair using your arms (e.g., wheelchair or bedside chair)?: Total Help needed to walk in hospital room?: Total Help needed climbing 3-5 steps with a railing? : Total 6 Click Score: 8    End of Session Equipment Utilized During Treatment: Gait belt Activity Tolerance: Patient limited by fatigue;Patient limited by pain Patient left: in chair;with chair alarm set;with call bell/phone within reach;with nursing/sitter in room Nurse Communication: Mobility status PT Visit Diagnosis: Other abnormalities of gait and mobility (R26.89);Muscle weakness (generalized) (M62.81);Pain Pain - Right/Left: Right Pain - part of body: Ankle and joints of foot (L buttock)     Time: 8664-8582 PT Time Calculation (min) (ACUTE ONLY): 42 min  Charges:    $Therapeutic Activity: 38-52 mins PT General Charges $$ ACUTE PT VISIT: 1 Visit                     Sylvan Delon Copp PT 08/17/2024  Acute Rehabilitation Services  Office [phone]

## 2024-08-18 ENCOUNTER — Inpatient Hospital Stay (HOSPITAL_COMMUNITY)

## 2024-08-18 LAB — CBC WITH DIFFERENTIAL/PLATELET
Abs Immature Granulocytes: 0.06 10*3/uL (ref 0.00–0.07)
Basophils Absolute: 0 10*3/uL (ref 0.0–0.1)
Basophils Relative: 0 %
Eosinophils Absolute: 0.5 10*3/uL (ref 0.0–0.5)
Eosinophils Relative: 5 %
HCT: 38.9 % — ABNORMAL LOW (ref 39.0–52.0)
Hemoglobin: 11.7 g/dL — ABNORMAL LOW (ref 13.0–17.0)
Immature Granulocytes: 1 %
Lymphocytes Relative: 12 %
Lymphs Abs: 1.1 10*3/uL (ref 0.7–4.0)
MCH: 25.7 pg — ABNORMAL LOW (ref 26.0–34.0)
MCHC: 30.1 g/dL (ref 30.0–36.0)
MCV: 85.5 fL (ref 80.0–100.0)
Monocytes Absolute: 1.1 10*3/uL — ABNORMAL HIGH (ref 0.1–1.0)
Monocytes Relative: 12 %
Neutro Abs: 6.4 10*3/uL (ref 1.7–7.7)
Neutrophils Relative %: 70 %
Platelets: 255 10*3/uL (ref 150–400)
RBC: 4.55 MIL/uL (ref 4.22–5.81)
RDW: 14.4 % (ref 11.5–15.5)
WBC: 9.1 10*3/uL (ref 4.0–10.5)
nRBC: 0 % (ref 0.0–0.2)

## 2024-08-18 LAB — COMPREHENSIVE METABOLIC PANEL WITH GFR
ALT: 20 U/L (ref 0–44)
AST: 61 U/L — ABNORMAL HIGH (ref 15–41)
Albumin: 3.3 g/dL — ABNORMAL LOW (ref 3.5–5.0)
Alkaline Phosphatase: 75 U/L (ref 38–126)
Anion gap: 9 (ref 5–15)
BUN: 18 mg/dL (ref 8–23)
CO2: 31 mmol/L (ref 22–32)
Calcium: 8.9 mg/dL (ref 8.9–10.3)
Chloride: 99 mmol/L (ref 98–111)
Creatinine, Ser: 1.09 mg/dL (ref 0.61–1.24)
GFR, Estimated: >60 mL/min
Glucose, Bld: 107 mg/dL — ABNORMAL HIGH (ref 70–99)
Potassium: 4.6 mmol/L (ref 3.5–5.1)
Sodium: 139 mmol/L (ref 135–145)
Total Bilirubin: 0.3 mg/dL (ref 0.0–1.2)
Total Protein: 7.3 g/dL (ref 6.5–8.1)

## 2024-08-18 LAB — CULTURE, BLOOD (ROUTINE X 2)
Culture: NO GROWTH
Culture: NO GROWTH

## 2024-08-18 LAB — GLUCOSE, CAPILLARY
Glucose-Capillary: 108 mg/dL — ABNORMAL HIGH (ref 70–99)
Glucose-Capillary: 109 mg/dL — ABNORMAL HIGH (ref 70–99)
Glucose-Capillary: 171 mg/dL — ABNORMAL HIGH (ref 70–99)
Glucose-Capillary: 129 mg/dL — ABNORMAL HIGH (ref 70–99)

## 2024-08-18 LAB — PHOSPHORUS: Phosphorus: 3.3 mg/dL (ref 2.5–4.6)

## 2024-08-18 LAB — MAGNESIUM: Magnesium: 2.6 mg/dL — ABNORMAL HIGH (ref 1.7–2.4)

## 2024-08-18 MED ORDER — FUROSEMIDE 10 MG/ML IJ SOLN
40.0000 mg | Freq: Once | INTRAMUSCULAR | Status: AC
  Administered 2024-08-18: 40 mg via INTRAVENOUS
  Filled 2024-08-18: qty 4

## 2024-08-18 MED ORDER — IPRATROPIUM-ALBUTEROL 0.5-2.5 (3) MG/3ML IN SOLN
3.0000 mL | Freq: Two times a day (BID) | RESPIRATORY_TRACT | Status: DC
  Administered 2024-08-18 – 2024-08-22 (×7): 3 mL via RESPIRATORY_TRACT
  Filled 2024-08-18 (×7): qty 3

## 2024-08-18 NOTE — Progress Notes (Signed)
 " PROGRESS NOTE    Christopher BOTTING  FMW:[id number] DOB: 05-24-1951 DOA: 08/13/2024 PCP: Claude Reagin, NP   Brief Narrative:  Christopher Riley is a 74 y.o. male with medical history significant for heart failure with reduced EF not recovered, DM2, hypertension, OSA, morbid obesity and stasis dermatitis with sacral decubitus ulcer being admitted to the hospital with leg pain due to right lower extremity cellulitis.  Patient was just discharged from the hospital on 1/7 after a stay for acute left-sided heart failure, diuresed successfully and discharged back home.   Workup in ED with leukocytosis, CT scan consistent with leg edema and cellulitis.  Blood cultures were drawn and patient was started on Rocephin  and vancomycin .  1/20: Patient became febrile last night up to 103, preliminary blood cultures negative.  CTA abdomen was negative for any significant stenosis.  More consistent with cellulitis and inguinal lymphadenopathy.  1/21: De-escalating Abx to IV Cefazolin  and Linezolid .   1/22: Had some Dyspnea and CXR showed mild cardiovascular congestion so will give a Dose of IV Lasix  in addition to Torsemide .   1/23: Leg is improving on antibiotics.  Will give an additional dose of IV Lasix  today.  1/24: Leg is further improving and Cellulitis is much improve. Going to give another dose of IV Furosemide .  Assessment and Plan:  Sepsis due to Cellulitis [facility]): Improving. Patient met sepsis criteria with fever, leukocytosis and tachycardia.  Likely secondary to right lower extremity cellulitis as evident on imaging.  Preliminary blood cultures negative. Continued with IV ceftriaxone  and vancomycin  but will change to Cefazolin  and Linezolid ; Leg is slowly improving daily and not as erythematous  -Will need to be OOB and Elevate Extremity  -WBC Trend remains elevated:  Recent Labs  Lab 07/30/24 0331 08/13/24 0520 08/13/24 2231 08/14/24 2115 08/15/24 2102 08/17/24 0506 08/18/24 0438   WBC 8.3 12.4* 12.1* 12.6* 13.7* 9.5 9.1  -Continue with supportive care -PT/OT evaluation ordered and recommending SNF but patient is refusing but he is not mobilizing well after DC home at this time. -Right lower extremity venous Doppler ordered and showed no DVT   Acute on Chronic Heart Failure with preserved ejection fraction (HFpEF) (HCC) Recent echo with normal EF and indeterminate diastolic function.  Recent admission secondary to CHF Exacerbation. -Avoid excessive IV fluid -Continue home Coreg , Crestor , Aldactone  and torsemide  and Jardiance . Strict I's and O's and daily Weights. Will give an additional dose of IV Furosemide  40 mg x1 again today (has received IV Lasix  Daily the last few days and this is the 3rd day in a Row).  Intake/Output Summary (Last 24 hours) at 08/18/2024 1522 Last data filed at 08/18/2024 1300 Gross per 24 hour  Intake 775 ml  Output 3200 ml  Net -2425 ml  -CXR as below  Dyspnea and Wheezing: Improving respiratory Status. Repeat CXR 1/24 showed Unchanged cardiomegaly and vascular congestion. No consolidative changes. No pleural effusion or pneumothorax. No acute osseous pathology. -Add DuoNebs scheduled q6h, Flutter Valve, Incentive Spirometry, and Guaifenesin  1200 mg po BID; C/w Albuterol  Nebs q2hprn -Repeat CXR in the AM   Hyperkalemia: K+ is now improved and 4.6. Getting a dose of IV lasix . May need to Hold Spironolactone  if persists. CTM & Trend & repeat CMP in the AM    Essential Hypertension: Blood pressure currently within goal. -Continuing home Coreg , Aldactone  and torsemide . CTM BP per Protocol. Last BP reading was a little soft at 123/51   Paroxysmal Atrial Fibrillation [facility]): Per documentation patient has declined DOAC  in the past.  Also has history of GI bleed. Continue with Carvedilol  3.125 mg po BID. Not on Telemetry   Type 2 Diabetes Mellitus (HCC): Seems well-controlled with CBG within goal and most recent A1c of 6.6 this hospitalization.  Continue with SSI. CTM CBGs per protocol. CBG Trend:  Recent Labs  Lab 08/16/24 1556 08/16/24 2215 08/17/24 0808 08/17/24 1309 08/17/24 2112 08/18/24 0741 08/18/24 1209  GLUCAP 78 135* 136* 98 123* 108* 109*    Renal Insufficiency: Fluctuating BUN/Cr Trend: Recent Labs  Lab 08/01/24 0422 08/13/24 0520 08/13/24 2231 08/14/24 2115 08/15/24 2102 08/17/24 0506 08/18/24 0438  BUN 19 14 16 20 19 18 18   CREATININE 1.29* 1.11 1.05 1.29* 1.29* 1.23 1.09  -Avoid Nephrotoxic Medications, Contrast Dyes, Hypotension and Dehydration to Ensure Adequate Renal Perfusion and will need to Renally Adjust Meds -Continue to Monitor and Trend Renal Function carefully and repeat CMP in the AM   Dyslipidemia: Continue with home Rosuvastatin  20 mg po qHS   Depression with Anxiety:  Continue home Queitapine 400 mg po qHS and 2 mg po Clonazepam  BIDprn   Normocytic Anemia: Hgb/Hct Trend:  Recent Labs  Lab 07/30/24 0331 08/13/24 0520 08/13/24 2231 08/14/24 2115 08/15/24 2102 08/17/24 0506 08/18/24 0438  HGB 11.8* 12.1* 11.9* 11.4* 12.1* 13.0 11.7*  HCT 38.4* 38.3* 38.3* 37.7* 39.6 40.3 38.9*  MCV 84.6 85.1 85.5 85.9 85.5 83.4 85.5  -Check Anemia Panel in the AM. CTM & Trend and Repeat CBC in the AM  GERD (Gastroesophageal Reflux disease)/GI Prophylaxis: Continue PPI w/ Pantoprazole  40 mg po Daily    OSA (obstructive sleep apnea):  CPAP at night  Hypoalbuminemia: Patient's Albumin Lvl went from 3.9 -> 3.5 -> 3.4 -> 3.3. CTM & Trend & Repeat CMP in the AM  Pressure Ulcer:  Wound 08/15/24 Pressure Injury Buttocks Left Stage 3 -  Full thickness tissue loss. Subcutaneous fat may be visible but bone, tendon or muscle are NOT exposed. (Active)    Class III (Morbid) Obesity: Complicates overall prognosis and care. Estimated body mass index is 48.2 kg/m as calculated from the following:   Height as of this encounter: 6' 2 (1.88 m).   Weight as of this encounter: 170.3 kg. Weight Loss and Dietary  Counseling given   DVT prophylaxis: Enoxaparin  80 mg sq q24h    Code Status: Full Code Family Communication: No family present @ bedside  Disposition Plan:  Level of care: Med-Surg Status is: Inpatient Remains inpatient appropriate because: Further clinical improvement and PT OT recommending SNF but he is declining and will go home with home health once he has been diuresed back to his dry weight and once his leg improves further.   Consultants:  None as during  Procedures:  As delineated as above  Antimicrobials:  Anti-infectives (From admission, onward)    Start     Dose/Rate Route Frequency Ordered Stop   08/15/24 2200  linezolid  (ZYVOX ) IVPB 600 mg        600 mg 300 mL/hr over 60 Minutes Intravenous Every 12 hours 08/15/24 2024     08/15/24 2200  ceFAZolin  (ANCEF ) IVPB 2g/100 mL premix        2 g 200 mL/hr over 30 Minutes Intravenous Every 8 hours 08/15/24 2029     08/15/24 1000  vancomycin  (VANCOREADY) IVPB 2000 mg/400 mL  Status:  Discontinued        2,000 mg 200 mL/hr over 120 Minutes Intravenous Every 24 hours 08/15/24 0828 08/15/24 2024   08/14/24 1000  vancomycin  (VANCOCIN ) 2,500 mg in sodium chloride  0.9 % 500 mL IVPB  Status:  Discontinued        2,500 mg 262.5 mL/hr over 120 Minutes Intravenous Every 24 hours 08/13/24 0826 08/15/24 0828   08/13/24 0815  cefTRIAXone  (ROCEPHIN ) 2 g in sodium chloride  0.9 % 100 mL IVPB  Status:  Discontinued        2 g 200 mL/hr over 30 Minutes Intravenous Every 24 hours 08/13/24 0808 08/15/24 2024   08/13/24 0800  vancomycin  (VANCOCIN ) 2,500 mg in sodium chloride  0.9 % 500 mL IVPB        2,500 mg 262.5 mL/hr over 120 Minutes Intravenous  Once 08/13/24 0748 08/13/24 1044   08/13/24 0545  cefTRIAXone  (ROCEPHIN ) 2 g in sodium chloride  0.9 % 100 mL IVPB        2 g 200 mL/hr over 30 Minutes Intravenous  Once 08/13/24 0542 08/13/24 [id number]       Subjective: Seen and examined at bedside and was feeling better.  Not short of breath.   His legs continue to improve further and he states that there is no real pain now.  Denies any chest pain.  Denies any nausea or vomiting and has no other concerns or complaints at this time.  Objective: Vitals:   08/18/24 0508 08/18/24 0740 08/18/24 0750 08/18/24 1447  BP: 139/60  (!) 140/62 (!) 123/51  Pulse: 87  91 80  Resp: 16   16  Temp: 98.1 F (36.7 C)   97.9 F (36.6 C)  TempSrc: Oral   Oral  SpO2: 94% 94%  96%  Weight:      Height:        Intake/Output Summary (Last 24 hours) at 08/18/2024 1523 Last data filed at 08/18/2024 1300 Gross per 24 hour  Intake 775 ml  Output 3200 ml  Net -2425 ml   Filed Weights   08/15/24 0732  Weight: (!) 170.3 kg   Examination: Physical Exam:  Constitutional: WN/WD, morbidly obese African-American male who is calm in no acute distress Respiratory: Diminished to auscultation bilaterally with some coarse breath sounds and has some crackles noted and slight rhonchi but no appreciable wheezing or rales. Normal respiratory effort and patient is not tachypenic. No accessory muscle use.  Not wearing supplemental oxygen nasal cannula Cardiovascular: RRR, no murmurs / rubs / gallops. S1 and S2 auscultated.  Mild 1+ extremity edema worse on the right compared to left but is significantly improving compared admission Abdomen: Soft, non-tender, distended secondary habitus. Bowel sounds positive.  GU: Deferred. Musculoskeletal: No clubbing / cyanosis of digits/nails. No joint deformity upper and lower extremities.  Skin: No rashes, lesions, ulcers on limited skin evaluation and did not turn him to view his buttock wound. No induration; Warm and dry.  Neurologic: CN 2-12 grossly intact with no focal deficits. Romberg sign and cerebellar reflexes not assessed.  Psychiatric: Normal judgment and insight. Alert and oriented x 3. Normal mood and appropriate affect.   Data Reviewed: I have personally reviewed following labs and imaging  studies  CBC: Recent Labs  Lab 08/13/24 2231 08/14/24 2115 08/15/24 2102 08/17/24 0506 08/18/24 0438  WBC 12.1* 12.6* 13.7* 9.5 9.1  NEUTROABS 9.4*  --  11.0* 6.9 6.4  HGB 11.9* 11.4* 12.1* 13.0 11.7*  HCT 38.3* 37.7* 39.6 40.3 38.9*  MCV 85.5 85.9 85.5 83.4 85.5  PLT 234 250 259 262 255   Basic Metabolic Panel: Recent Labs  Lab 08/13/24 2231 08/14/24 2115 08/15/24 2102 08/17/24 0506 08/18/24  0438  NA 139 138 139 138 139  K 4.5 4.4 5.2* 4.4 4.6  CL 102 100 100 97* 99  CO2 29 29 29 29 31   GLUCOSE 112* 115* 102* 109* 107*  BUN 16 20 19 18 18   CREATININE 1.05 1.29* 1.29* 1.23 1.09  CALCIUM  8.8* 8.7* 8.9 8.9 8.9  MG  --   --  2.2 2.4 2.6*  PHOS  --   --  3.6 3.4 3.3   GFR: Estimated Creatinine Clearance: 97.2 mL/min (by C-G formula based on SCr of 1.09 mg/dL). Liver Function Tests: Recent Labs  Lab 08/13/24 0520 08/15/24 2102 08/17/24 0506 08/18/24 0438  AST 20 20 25  61*  ALT 16 17 15 20   ALKPHOS 72 70 71 75  BILITOT 0.4 0.3 0.3 0.3  PROT 7.2 7.3 7.5 7.3  ALBUMIN 3.9 3.5 3.4* 3.3*   No results for input(s): LIPASE, AMYLASE in the last 168 hours. No results for input(s): AMMONIA in the last 168 hours. Coagulation Profile: No results for input(s): INR, PROTIME in the last 168 hours. Cardiac Enzymes: No results for input(s): CKTOTAL, CKMB, CKMBINDEX, TROPONINI in the last 168 hours. BNP (last 3 results) Recent Labs    07/21/24 0039 07/28/24 1525  PROBNP 351.0* 180.0   HbA1C: No results for input(s): HGBA1C in the last 72 hours. CBG: Recent Labs  Lab 08/17/24 0808 08/17/24 1309 08/17/24 2112 08/18/24 0741 08/18/24 1209  GLUCAP 136* 98 123* 108* 109*   Lipid Profile: No results for input(s): CHOL, HDL, LDLCALC, TRIG, CHOLHDL, LDLDIRECT in the last 72 hours. Thyroid Function Tests: No results for input(s): TSH, T4TOTAL, FREET4, T3FREE, THYROIDAB in the last 72 hours. Anemia Panel: No results for  input(s): VITAMINB12, FOLATE, FERRITIN, TIBC, IRON, RETICCTPCT in the last 72 hours. Sepsis Labs: Recent Labs  Lab 08/13/24 1035  LATICACIDVEN 0.6   Recent Results (from the past 240 hours)  Culture, blood (Routine X 2) w Reflex to ID Panel     Status: None   Collection Time: 08/13/24  5:26 AM   Specimen: BLOOD  Result Value Ref Range Status   Specimen Description   Final    BLOOD LEFT ANTECUBITAL Performed at [facility], [address], [place], [place] [address]    Special Requests   Final    BOTTLES DRAWN AEROBIC AND ANAEROBIC Blood Culture results may not be optimal due to an inadequate volume of blood received in culture bottles Performed at [facility], [address], [place], [place] [address]    Culture   Final    NO GROWTH 5 DAYS Performed at [facility], [address], [place], [place] [address]    Report Status 08/18/2024 FINAL  Final  Culture, blood (Routine X 2) w Reflex to ID Panel     Status: None   Collection Time: 08/13/24  5:37 AM   Specimen: BLOOD  Result Value Ref Range Status   Specimen Description   Final    BLOOD RIGHT ANTECUBITAL Performed at [facility], [address], [place], [place] [address]    Special Requests   Final    BOTTLES DRAWN AEROBIC ONLY Blood Culture results may not be optimal due to an inadequate volume of blood received in culture bottles Performed at [facility], [address] [place], [place] [address]    Culture   Final    NO GROWTH 5 DAYS Performed at [facility], [address] [place],  [place] [address]    Report Status 08/18/2024 FINAL  Final  Resp panel by RT-PCR (RSV, Flu A&B, Covid) Anterior Nasal Swab     Status: None   Collection Time: 08/13/24 10:49 PM   Specimen: Anterior Nasal Swab  Result Value Ref Range Status   SARS Coronavirus 2 by RT PCR NEGATIVE NEGATIVE Final    Comment: (NOTE) SARS-CoV-2  target nucleic acids are NOT DETECTED.  The SARS-CoV-2 RNA is generally detectable in upper respiratory specimens during the acute phase of infection. The lowest concentration of SARS-CoV-2 viral copies this assay can detect is 138 copies/mL. A negative result does not preclude SARS-Cov-2 infection and should not be used as the sole basis for treatment or other patient management decisions. A negative result may occur with  improper specimen collection/handling, submission of specimen other than nasopharyngeal swab, presence of viral mutation(s) within the areas targeted by this assay, and inadequate number of viral copies(<138 copies/mL). A negative result must be combined with clinical observations, patient history, and epidemiological information. The expected result is Negative.  Fact Sheet for Patients:  bloggercourse.com  Fact Sheet for Healthcare Providers:  seriousbroker.it  This test is no t yet approved or cleared by the [place]  FDA and  has been authorized for detection and/or diagnosis of SARS-CoV-2 by FDA under an Emergency Use Authorization (EUA). This EUA will remain  in effect (meaning this test can be used) for the duration of the COVID-19 declaration under Section 564(b)(1) of the Act, 21 U.S.C.section 360bbb-3(b)(1), unless the authorization is terminated  or revoked sooner.       Influenza A by PCR NEGATIVE NEGATIVE Final   Influenza B by PCR NEGATIVE NEGATIVE Final    Comment: (NOTE) The Xpert Xpress SARS-CoV-2/FLU/RSV plus assay is intended as an aid in the diagnosis of influenza from Nasopharyngeal swab specimens and should not be used as a sole basis for treatment. Nasal washings and aspirates are unacceptable for Xpert Xpress SARS-CoV-2/FLU/RSV testing.  Fact Sheet for Patients: bloggercourse.com  Fact Sheet for Healthcare  Providers: seriousbroker.it  This test is not yet approved or cleared by the [place]  FDA and has been authorized for detection and/or diagnosis of SARS-CoV-2 by FDA under an Emergency Use Authorization (EUA). This EUA will remain in effect (meaning this test can be used) for the duration of the COVID-19 declaration under Section 564(b)(1) of the Act, 21 U.S.C. section 360bbb-3(b)(1), unless the authorization is terminated or revoked.     Resp Syncytial Virus by PCR NEGATIVE NEGATIVE Final    Comment: (NOTE) Fact Sheet for Patients: bloggercourse.com  Fact Sheet for Healthcare Providers: seriousbroker.it  This test is not yet approved or cleared by the [place]  FDA and has been authorized for detection and/or diagnosis of SARS-CoV-2 by FDA under an Emergency Use Authorization (EUA). This EUA will remain in effect (meaning this test can be used) for the duration of the COVID-19 declaration under Section 564(b)(1) of the Act, 21 U.S.C. section 360bbb-3(b)(1), unless the authorization is terminated or revoked.  Performed at [facility], [address], [place], [place] [address]     Radiology Studies: DG CHEST PORT 1 VIEW Result Date: 08/18/2024 CLINICAL DATA:  Shortness of breath. EXAM: PORTABLE CHEST 1 VIEW COMPARISON:  Radiograph 08/16/2024, CT 07/28/2024 FINDINGS: Cardiomegaly is stable. Unchanged mediastinal contours. Similar vascular congestion. No confluent opacity, large pleural effusion or pneumothorax. Stable osseous structures. IMPRESSION: Unchanged cardiomegaly and vascular congestion. Electronically Signed   By: Andrea Gasman M.D.   On:  08/18/2024 11:00   Scheduled Meds:  carvedilol   3.125 mg Oral BID WC   collagenase    Topical Daily   empagliflozin   10 mg Oral Daily   enoxaparin  (LOVENOX ) injection  80 mg Subcutaneous Q24H   furosemide   40 mg Intravenous Once    guaiFENesin   1,200 mg Oral BID   insulin  aspart  0-15 Units Subcutaneous TID WC   insulin  aspart  0-5 Units Subcutaneous QHS   ipratropium-albuterol   3 mL Nebulization BID   latanoprost   1 drop Both Eyes QHS   pantoprazole   40 mg Oral Daily   QUEtiapine   400 mg Oral QHS   rosuvastatin   20 mg Oral QHS   spironolactone   25 mg Oral Daily   torsemide   20 mg Oral Daily   Continuous Infusions:   ceFAZolin  (ANCEF ) IV 2 g (08/18/24 1432)   linezolid  (ZYVOX ) IV 600 mg (08/18/24 1024)    LOS: 5 days   Alejandro Marker, DO [facility] Available via Epic secure chat 7am-7pm After these hours, please refer to coverage provider listed on [email or web address] 08/18/2024, 3:23 PM  "

## 2024-08-18 NOTE — Progress Notes (Signed)
" °   08/17/24 2230  BiPAP/CPAP/SIPAP  BiPAP/CPAP/SIPAP Pt Type Adult  BiPAP/CPAP/SIPAP DREAMSTATIOND  Mask Type Full face mask  Dentures removed? Not applicable  Mask Size Large  FiO2 (%) 21 %  Patient Home Machine No  Patient Home Mask No  Patient Home Tubing No  Auto Titrate Yes  Minimum cmH2O 5 cmH2O  Maximum cmH2O 20 cmH2O  Device Plugged into RED Power Outlet Yes    "

## 2024-08-18 NOTE — Plan of Care (Signed)

## 2024-08-18 NOTE — Plan of Care (Signed)
" °  Problem: Coping: Goal: Ability to adjust to condition or change in health will improve Outcome: Progressing   Problem: Metabolic: Goal: Ability to maintain appropriate glucose levels will improve Outcome: Progressing   Problem: Nutritional: Goal: Maintenance of adequate nutrition will improve Outcome: Progressing   Problem: Skin Integrity: Goal: Risk for impaired skin integrity will decrease Outcome: Progressing   Problem: Pain Managment: Goal: General experience of comfort will improve and/or be controlled Outcome: Progressing   "

## 2024-08-19 ENCOUNTER — Inpatient Hospital Stay (HOSPITAL_COMMUNITY)

## 2024-08-19 LAB — CBC WITH DIFFERENTIAL/PLATELET
Abs Immature Granulocytes: 0.08 10*3/uL — ABNORMAL HIGH (ref 0.00–0.07)
Basophils Absolute: 0 10*3/uL (ref 0.0–0.1)
Basophils Relative: 0 %
Eosinophils Absolute: 0.6 10*3/uL — ABNORMAL HIGH (ref 0.0–0.5)
Eosinophils Relative: 6 %
HCT: 40.2 % (ref 39.0–52.0)
Hemoglobin: 12.2 g/dL — ABNORMAL LOW (ref 13.0–17.0)
Immature Granulocytes: 1 %
Lymphocytes Relative: 17 %
Lymphs Abs: 1.6 10*3/uL (ref 0.7–4.0)
MCH: 26.2 pg (ref 26.0–34.0)
MCHC: 30.3 g/dL (ref 30.0–36.0)
MCV: 86.3 fL (ref 80.0–100.0)
Monocytes Absolute: 1.2 10*3/uL — ABNORMAL HIGH (ref 0.1–1.0)
Monocytes Relative: 13 %
Neutro Abs: 5.9 10*3/uL (ref 1.7–7.7)
Neutrophils Relative %: 63 %
Platelets: 270 10*3/uL (ref 150–400)
RBC: 4.66 MIL/uL (ref 4.22–5.81)
RDW: 14.4 % (ref 11.5–15.5)
WBC: 9.4 10*3/uL (ref 4.0–10.5)
nRBC: 0 % (ref 0.0–0.2)

## 2024-08-19 LAB — COMPREHENSIVE METABOLIC PANEL WITH GFR
ALT: 18 U/L (ref 0–44)
AST: 42 U/L — ABNORMAL HIGH (ref 15–41)
Albumin: 3.3 g/dL — ABNORMAL LOW (ref 3.5–5.0)
Alkaline Phosphatase: 65 U/L (ref 38–126)
Anion gap: 6 (ref 5–15)
BUN: 18 mg/dL (ref 8–23)
CO2: 34 mmol/L — ABNORMAL HIGH (ref 22–32)
Calcium: 9.1 mg/dL (ref 8.9–10.3)
Chloride: 99 mmol/L (ref 98–111)
Creatinine, Ser: 1.17 mg/dL (ref 0.61–1.24)
GFR, Estimated: >60 mL/min
Glucose, Bld: 96 mg/dL (ref 70–99)
Potassium: 5.1 mmol/L (ref 3.5–5.1)
Sodium: 139 mmol/L (ref 135–145)
Total Bilirubin: 0.3 mg/dL (ref 0.0–1.2)
Total Protein: 7.1 g/dL (ref 6.5–8.1)

## 2024-08-19 LAB — GLUCOSE, CAPILLARY
Glucose-Capillary: 101 mg/dL — ABNORMAL HIGH (ref 70–99)
Glucose-Capillary: 98 mg/dL (ref 70–99)
Glucose-Capillary: 124 mg/dL — ABNORMAL HIGH (ref 70–99)
Glucose-Capillary: 122 mg/dL — ABNORMAL HIGH (ref 70–99)

## 2024-08-19 LAB — PHOSPHORUS: Phosphorus: 3.6 mg/dL (ref 2.5–4.6)

## 2024-08-19 LAB — PRO BRAIN NATRIURETIC PEPTIDE: Pro Brain Natriuretic Peptide: 73.9 pg/mL

## 2024-08-19 LAB — MAGNESIUM: Magnesium: 2.7 mg/dL — ABNORMAL HIGH (ref 1.7–2.4)

## 2024-08-19 MED ORDER — POLYETHYLENE GLYCOL 3350 17 G PO PACK
17.0000 g | PACK | Freq: Two times a day (BID) | ORAL | Status: DC
  Administered 2024-08-19 – 2024-08-23 (×8): 17 g via ORAL
  Filled 2024-08-19 (×9): qty 1

## 2024-08-19 MED ORDER — SENNOSIDES-DOCUSATE SODIUM 8.6-50 MG PO TABS
1.0000 | ORAL_TABLET | Freq: Two times a day (BID) | ORAL | Status: DC
  Administered 2024-08-19 – 2024-08-23 (×7): 1 via ORAL
  Filled 2024-08-19 (×9): qty 1

## 2024-08-19 MED ORDER — FUROSEMIDE 10 MG/ML IJ SOLN
40.0000 mg | Freq: Once | INTRAMUSCULAR | Status: AC
  Administered 2024-08-19: 40 mg via INTRAVENOUS
  Filled 2024-08-19: qty 4

## 2024-08-19 MED ORDER — GUAIFENESIN-DM 100-10 MG/5ML PO SYRP
5.0000 mL | ORAL_SOLUTION | ORAL | Status: DC | PRN
  Administered 2024-08-19: 5 mL via ORAL
  Filled 2024-08-19: qty 10

## 2024-08-19 MED ORDER — BISACODYL 10 MG RE SUPP: 10.0000 mg | Freq: Every day | RECTAL | Status: DC | PRN

## 2024-08-19 NOTE — Progress Notes (Signed)
" °   08/19/24 0032  BiPAP/CPAP/SIPAP  BiPAP/CPAP/SIPAP Pt Type Adult  BiPAP/CPAP/SIPAP DREAMSTATIOND  Mask Type Full face mask  Dentures removed? Not applicable  Mask Size Large  Respiratory Rate 20 breaths/min  FiO2 (%) 21 %  Patient Home Machine No  Patient Home Mask No  Patient Home Tubing No  Auto Titrate Yes  Minimum cmH2O 5 cmH2O  Maximum cmH2O 20 cmH2O  Device Plugged into RED Power Outlet Yes    "

## 2024-08-19 NOTE — Progress Notes (Signed)
 " PROGRESS NOTE    NDREW Riley  FMW:[id number] DOB: April 10, 1951 DOA: 08/13/2024 PCP: Claude Reagin, NP   Brief Narrative:  Christopher Riley is a 74 y.o. male with medical history significant for heart failure with reduced EF not recovered, DM2, hypertension, OSA, morbid obesity and stasis dermatitis with sacral decubitus ulcer being admitted to the hospital with leg pain due to right lower extremity cellulitis.  Patient was just discharged from the hospital on 1/7 after a stay for acute left-sided heart failure, diuresed successfully and discharged back home.   Workup in ED with leukocytosis, CT scan consistent with leg edema and cellulitis.  Blood cultures were drawn and patient was started on Rocephin  and vancomycin .  1/20: Patient became febrile last night up to 103, preliminary blood cultures negative.  CTA abdomen was negative for any significant stenosis.  More consistent with cellulitis and inguinal lymphadenopathy.  1/21: De-escalating Abx to IV Cefazolin  and Linezolid .   1/22: Had some Dyspnea and CXR showed mild cardiovascular congestion so will give a Dose of IV Lasix  in addition to Torsemide .   1/23: Leg is improving on antibiotics.  Will give an additional dose of IV Lasix  today.  1/24: Leg is further improving and Cellulitis is much improve. Going to give another dose of IV Furosemide .  1/25: Will get another dose of IV Furosemide . Bowel regimen initiated.  Assessment and Plan:  Sepsis due to Cellulitis [facility]): Improving. Patient met sepsis criteria with fever, leukocytosis and tachycardia.  Likely secondary to right lower extremity cellulitis as evident on imaging.  Preliminary blood cultures negative. Continued with IV ceftriaxone  and vancomycin  but will change to Cefazolin  and Linezolid ; Leg is slowly improving daily and not as erythematous; Will complete 10 days of Tx -Will need to be OOB and Elevate Extremity  -WBC Trend remains elevated:  Recent Labs  Lab  08/13/24 0520 08/13/24 2231 08/14/24 2115 08/15/24 2102 08/17/24 0506 08/18/24 0438 08/19/24 0453  WBC 12.4* 12.1* 12.6* 13.7* 9.5 9.1 9.4  -Continue with supportive care -PT/OT evaluation ordered and recommending SNF but patient is refusing but he is not mobilizing well after DC home at this time. -Right lower extremity venous Doppler ordered and showed no DVT   Acute on Chronic Heart Failure with preserved ejection fraction (HFpEF) (HCC) Recent echo with normal EF and indeterminate diastolic function.  Recent admission secondary to CHF Exacerbation. -Avoid excessive IV fluid -Pro-BNP is now 73.9 -Continue home Coreg , Crestor , Aldactone  and torsemide  and Jardiance . Strict I's and O's and daily Weights. Will give an additional dose of IV Furosemide  40 mg x1 again today (has received IV Lasix  Daily the last few days and this is the 4rd day in a Row).  Intake/Output Summary (Last 24 hours) at 08/19/2024 1250 Last data filed at 08/19/2024 9367 Gross per 24 hour  Intake 2060 ml  Output 3950 ml  Net -1890 ml  -CXR as below  Dyspnea and Wheezing: Improving respiratory Status. Repeat CXR 1/25 showed Enlargement of the cardiopericardial silhouette with pulmonary vascular congestion. -Add DuoNebs scheduled q6h, Flutter Valve, Incentive Spirometry, and Guaifenesin  1200 mg po BID; C/w Albuterol  Nebs q2hprn -Repeat CXR in the AM   Constipation: She has bowel regimen with Senna-Docusate 1 tab p.o. twice daily, MiraLAX  17 g p.o. twice daily, and Bisacodyl  10 mg RC Dailyprn  Hyperkalemia: K+ is now improved and 4.6. Getting a dose of IV lasix . May need to Hold Spironolactone  if persists. CTM & Trend & repeat CMP in the AM  Essential Hypertension: Blood pressure currently within goal. -Continuing home Coreg , Aldactone  and torsemide . CTM BP per Protocol. Last BP reading was a little soft at 123/51   Paroxysmal Atrial Fibrillation [facility]): Per documentation patient has declined DOAC in the past.   Also has history of GI bleed. Continue with Carvedilol  3.125 mg po BID. Not on Telemetry   Type 2 Diabetes Mellitus (HCC): Seems well-controlled with CBG within goal and most recent A1c of 6.6 this hospitalization. Continue with SSI. CTM CBGs per protocol. CBG Trend:  Recent Labs  Lab 08/17/24 2112 08/18/24 0741 08/18/24 1209 08/18/24 1626 08/18/24 2020 08/19/24 0802 08/19/24 1222  GLUCAP 123* 108* 109* 171* 129* 101* 98    Renal Insufficiency: Fluctuating BUN/Cr Trend: Recent Labs  Lab 08/13/24 0520 08/13/24 2231 08/14/24 2115 08/15/24 2102 08/17/24 0506 08/18/24 0438 08/19/24 0453  BUN 14 16 20 19 18 18 18   CREATININE 1.11 1.05 1.29* 1.29* 1.23 1.09 1.17  -Avoid Nephrotoxic Medications, Contrast Dyes, Hypotension and Dehydration to Ensure Adequate Renal Perfusion and will need to Renally Adjust Meds -Continue to Monitor and Trend Renal Function carefully and repeat CMP in the AM   Dyslipidemia: Continue with home Rosuvastatin  20 mg po qHS   Depression with Anxiety:  Continue home Queitapine 400 mg po qHS and 2 mg po Clonazepam  BIDprn   Normocytic Anemia: Hgb/Hct Trend:  Recent Labs  Lab 08/13/24 0520 08/13/24 2231 08/14/24 2115 08/15/24 2102 08/17/24 0506 08/18/24 0438 08/19/24 0453  HGB 12.1* 11.9* 11.4* 12.1* 13.0 11.7* 12.2*  HCT 38.3* 38.3* 37.7* 39.6 40.3 38.9* 40.2  MCV 85.1 85.5 85.9 85.5 83.4 85.5 86.3  -Check Anemia Panel in the AM. CTM & Trend and Repeat CBC in the AM  GERD (Gastroesophageal Reflux disease)/GI Prophylaxis: Continue PPI w/ Pantoprazole  40 mg po Daily    OSA (obstructive sleep apnea):  CPAP at night  Hypoalbuminemia: Patient's Albumin Lvl went from 3.9 -> 3.5 -> 3.4 -> 3.3 x2. CTM & Trend & Repeat CMP in the AM  Pressure Ulcer:  Wound 08/15/24 Pressure Injury Buttocks Left Stage 3 -  Full thickness tissue loss. Subcutaneous fat may be visible but bone, tendon or muscle are NOT exposed. (Active)    Class III (Morbid) Obesity:  Complicates overall prognosis and care. Estimated body mass index is 48.2 kg/m as calculated from the following:   Height as of this encounter: 6' 2 (1.88 m).   Weight as of this encounter: 170.3 kg. Weight Loss and Dietary Counseling given   DVT prophylaxis: Enoxaparin  80 mg sq q24h    Code Status: Full Code Family Communication: No family currently at bedside  Disposition Plan:  Level of care: Med-Surg Status is: Inpatient Remains inpatient appropriate because: Needs further clinical improvement and anticipate discharge in the next 24 to 48 hours   Consultants:  None  Procedures:  As delineated as above  Antimicrobials:  Anti-infectives (From admission, onward)    Start     Dose/Rate Route Frequency Ordered Stop   08/15/24 2200  linezolid  (ZYVOX ) IVPB 600 mg        600 mg 300 mL/hr over 60 Minutes Intravenous Every 12 hours 08/15/24 2024     08/15/24 2200  ceFAZolin  (ANCEF ) IVPB 2g/100 mL premix        2 g 200 mL/hr over 30 Minutes Intravenous Every 8 hours 08/15/24 2029     08/15/24 1000  vancomycin  (VANCOREADY) IVPB 2000 mg/400 mL  Status:  Discontinued        2,000 mg 200  mL/hr over 120 Minutes Intravenous Every 24 hours 08/15/24 0828 08/15/24 2024   08/14/24 1000  vancomycin  (VANCOCIN ) 2,500 mg in sodium chloride  0.9 % 500 mL IVPB  Status:  Discontinued        2,500 mg 262.5 mL/hr over 120 Minutes Intravenous Every 24 hours 08/13/24 0826 08/15/24 0828   08/13/24 0815  cefTRIAXone  (ROCEPHIN ) 2 g in sodium chloride  0.9 % 100 mL IVPB  Status:  Discontinued        2 g 200 mL/hr over 30 Minutes Intravenous Every 24 hours 08/13/24 0808 08/15/24 2024   08/13/24 0800  vancomycin  (VANCOCIN ) 2,500 mg in sodium chloride  0.9 % 500 mL IVPB        2,500 mg 262.5 mL/hr over 120 Minutes Intravenous  Once 08/13/24 0748 08/13/24 1044   08/13/24 0545  cefTRIAXone  (ROCEPHIN ) 2 g in sodium chloride  0.9 % 100 mL IVPB        2 g 200 mL/hr over 30 Minutes Intravenous  Once 08/13/24 0542  08/13/24 9376       Subjective: Seen and examined at bedside thinks he is doing better.  Still slightly short of breath.  Still has not had a bowel movement and quite a few days.  No nausea or vomiting.  Leg is not as sore and doing much better.  No other concerns or questions.  Objective: Vitals:   08/19/24 0028 08/19/24 0507 08/19/24 0810 08/19/24 0826  BP:  116/65 118/70   Pulse:  77 78   Resp:  16    Temp:  98.4 F (36.9 C)    TempSrc:  Oral    SpO2: 97% 93%  94%  Weight:      Height:        Intake/Output Summary (Last 24 hours) at 08/19/2024 1259 Last data filed at 08/19/2024 9367 Gross per 24 hour  Intake 2060 ml  Output 3950 ml  Net -1890 ml   Filed Weights   08/15/24 0732  Weight: (!) 170.3 kg   Examination: Physical Exam:  Constitutional: WN/WD, morbidly obese African-American male no acute distress Respiratory: Diminished to auscultation bilaterally with some coarse breath sounds and has some slight crackles but no appreciable rhonchi or wheezing. Normal respiratory effort and patient is not tachypenic. No accessory muscle use.  Cardiovascular: RRR, no murmurs / rubs / gallops. S1 and S2 auscultated.  Mild mild extremity edema Abdomen: Soft, non-tender, distended secondary but habitus. Bowel sounds positive.  GU: Deferred. Musculoskeletal: No clubbing / cyanosis of digits/nails. No joint deformity upper and lower extremities.  Skin: No rashes, lesions, ulcers on limited skin evaluation. No induration; Warm and dry.  Neurologic: CN 2-12 grossly intact with no focal deficits. Romberg sign and cerebellar reflexes not assessed.  Psychiatric: Normal judgment and insight. Alert and oriented x 3. Normal mood and appropriate affect.   Data Reviewed: I have personally reviewed following labs and imaging studies  CBC: Recent Labs  Lab 08/13/24 2231 08/14/24 2115 08/15/24 2102 08/17/24 0506 08/18/24 0438 08/19/24 0453  WBC 12.1* 12.6* 13.7* 9.5 9.1 9.4  NEUTROABS  9.4*  --  11.0* 6.9 6.4 5.9  HGB 11.9* 11.4* 12.1* 13.0 11.7* 12.2*  HCT 38.3* 37.7* 39.6 40.3 38.9* 40.2  MCV 85.5 85.9 85.5 83.4 85.5 86.3  PLT 234 250 259 262 255 270   Basic Metabolic Panel: Recent Labs  Lab 08/14/24 2115 08/15/24 2102 08/17/24 0506 08/18/24 0438 08/19/24 0453  NA 138 139 138 139 139  K 4.4 5.2* 4.4 4.6 5.1  CL  100 100 97* 99 99  CO2 29 29 29 31  34*  GLUCOSE 115* 102* 109* 107* 96  BUN 20 19 18 18 18   CREATININE 1.29* 1.29* 1.23 1.09 1.17  CALCIUM  8.7* 8.9 8.9 8.9 9.1  MG  --  2.2 2.4 2.6* 2.7*  PHOS  --  3.6 3.4 3.3 3.6   GFR: Estimated Creatinine Clearance: 90.6 mL/min (by C-G formula based on SCr of 1.17 mg/dL). Liver Function Tests: Recent Labs  Lab 08/13/24 0520 08/15/24 2102 08/17/24 0506 08/18/24 0438 08/19/24 0453  AST 20 20 25  61* 42*  ALT 16 17 15 20 18   ALKPHOS 72 70 71 75 65  BILITOT 0.4 0.3 0.3 0.3 0.3  PROT 7.2 7.3 7.5 7.3 7.1  ALBUMIN 3.9 3.5 3.4* 3.3* 3.3*   No results for input(s): LIPASE, AMYLASE in the last 168 hours. No results for input(s): AMMONIA in the last 168 hours. Coagulation Profile: No results for input(s): INR, PROTIME in the last 168 hours. Cardiac Enzymes: No results for input(s): CKTOTAL, CKMB, CKMBINDEX, TROPONINI in the last 168 hours. BNP (last 3 results) Recent Labs    07/21/24 0039 07/28/24 1525 08/19/24 0453  PROBNP 351.0* 180.0 73.9   HbA1C: No results for input(s): HGBA1C in the last 72 hours. CBG: Recent Labs  Lab 08/18/24 1209 08/18/24 1626 08/18/24 2020 08/19/24 0802 08/19/24 1222  GLUCAP 109* 171* 129* 101* 98   Lipid Profile: No results for input(s): CHOL, HDL, LDLCALC, TRIG, CHOLHDL, LDLDIRECT in the last 72 hours. Thyroid Function Tests: No results for input(s): TSH, T4TOTAL, FREET4, T3FREE, THYROIDAB in the last 72 hours. Anemia Panel: No results for input(s): VITAMINB12, FOLATE, FERRITIN, TIBC, IRON, RETICCTPCT in the  last 72 hours. Sepsis Labs: Recent Labs  Lab 08/13/24 1035  LATICACIDVEN 0.6   Recent Results (from the past 240 hours)  Culture, blood (Routine X 2) w Reflex to ID Panel     Status: None   Collection Time: 08/13/24  5:26 AM   Specimen: BLOOD  Result Value Ref Range Status   Specimen Description   Final    BLOOD LEFT ANTECUBITAL Performed at [facility], [address], [place], [place] [address]    Special Requests   Final    BOTTLES DRAWN AEROBIC AND ANAEROBIC Blood Culture results may not be optimal due to an inadequate volume of blood received in culture bottles Performed at [facility], [address] [place], [place] [address]    Culture   Final    NO GROWTH 5 DAYS Performed at [facility], [address] [place], [place] [address]    Report Status 08/18/2024 FINAL  Final  Culture, blood (Routine X 2) w Reflex to ID Panel     Status: None   Collection Time: 08/13/24  5:37 AM   Specimen: BLOOD  Result Value Ref Range Status   Specimen Description   Final    BLOOD RIGHT ANTECUBITAL Performed at [facility], [address] [place], [place] [address]    Special Requests   Final    BOTTLES DRAWN AEROBIC ONLY Blood Culture results may not be optimal due to an inadequate volume of blood received in culture bottles Performed at [facility], [address] [place], [place] [address]    Culture   Final    NO GROWTH 5 DAYS Performed at [facility], [address], [place], [place] [address]    Report Status 08/18/2024 FINAL  Final  Resp panel by RT-PCR (RSV, Flu A&B, Covid) Anterior Nasal Swab     Status: None   Collection Time: 08/13/24 10:49 PM   Specimen: Anterior Nasal Swab  Result Value Ref Range Status   SARS Coronavirus 2 by RT PCR NEGATIVE NEGATIVE Final    Comment: (NOTE) SARS-CoV-2 target nucleic acids are NOT DETECTED.  The SARS-CoV-2 RNA is generally detectable in  upper respiratory specimens during the acute phase of infection. The lowest concentration of SARS-CoV-2 viral copies this assay can detect is 138 copies/mL. A negative result does not preclude SARS-Cov-2 infection and should not be used as the sole basis for treatment or other patient management decisions. A negative result may occur with  improper specimen collection/handling, submission of specimen other than nasopharyngeal swab, presence of viral mutation(s) within the areas targeted by this assay, and inadequate number of viral copies(<138 copies/mL). A negative result must be combined with clinical observations, patient history, and epidemiological information. The expected result is Negative.  Fact Sheet for Patients:  bloggercourse.com  Fact Sheet for Healthcare Providers:  seriousbroker.it  This test is no t yet approved or cleared by the [place]  FDA and  has been authorized for detection and/or diagnosis of SARS-CoV-2 by FDA under an Emergency Use Authorization (EUA). This EUA will remain  in effect (meaning this test can be used) for the duration of the COVID-19 declaration under Section 564(b)(1) of the Act, 21 U.S.C.section 360bbb-3(b)(1), unless the authorization is terminated  or revoked sooner.       Influenza A by PCR NEGATIVE NEGATIVE Final   Influenza B by PCR NEGATIVE NEGATIVE Final    Comment: (NOTE) The Xpert Xpress SARS-CoV-2/FLU/RSV plus assay is intended as an aid in the diagnosis of influenza from Nasopharyngeal swab specimens and should not be used as a sole basis for treatment. Nasal washings and aspirates are unacceptable for Xpert Xpress SARS-CoV-2/FLU/RSV testing.  Fact Sheet for Patients: bloggercourse.com  Fact Sheet for Healthcare Providers: seriousbroker.it  This test is not yet approved or cleared by the [place]  FDA and has been  authorized for detection and/or diagnosis of SARS-CoV-2 by FDA under an Emergency Use Authorization (EUA). This EUA will remain in effect (meaning this test can be used) for the duration of the COVID-19 declaration under Section 564(b)(1) of the Act, 21 U.S.C. section 360bbb-3(b)(1), unless the authorization is terminated or revoked.     Resp Syncytial Virus by PCR NEGATIVE NEGATIVE Final    Comment: (NOTE) Fact Sheet for Patients: bloggercourse.com  Fact Sheet for Healthcare Providers: seriousbroker.it  This test is not yet approved or cleared by the [place]  FDA and has been authorized for detection and/or diagnosis of SARS-CoV-2 by FDA under an Emergency Use Authorization (EUA). This EUA will remain in effect (meaning this test can be used) for the duration of the COVID-19 declaration under Section 564(b)(1) of the Act, 21 U.S.C. section 360bbb-3(b)(1), unless the authorization is terminated or revoked.  Performed at [facility], [address], [place], [place] [address]     Radiology Studies: DG CHEST PORT 1 VIEW Result Date: 08/19/2024 CLINICAL DATA:  Congestive heart failure. EXAM: PORTABLE CHEST 1 VIEW COMPARISON:  08/18/2024 FINDINGS: The cardio pericardial silhouette is enlarged. There is pulmonary vascular congestion without overt pulmonary edema. No focal consolidation or overt airspace pulmonary edema. No substantial pleural effusion. No acute bony abnormality. IMPRESSION: Enlargement of the cardiopericardial silhouette with pulmonary vascular congestion. Electronically Signed   By: Camellia Minus HERO.D.  On: 08/19/2024 05:31   DG CHEST PORT 1 VIEW Result Date: 08/18/2024 CLINICAL DATA:  Shortness of breath. EXAM: PORTABLE CHEST 1 VIEW COMPARISON:  Radiograph 08/16/2024, CT 07/28/2024 FINDINGS: Cardiomegaly is stable. Unchanged mediastinal contours. Similar vascular congestion. No confluent opacity,  large pleural effusion or pneumothorax. Stable osseous structures. IMPRESSION: Unchanged cardiomegaly and vascular congestion. Electronically Signed   By: Andrea Gasman M.D.   On: 08/18/2024 11:00   Scheduled Meds:  carvedilol   3.125 mg Oral BID WC   collagenase    Topical Daily   empagliflozin   10 mg Oral Daily   enoxaparin  (LOVENOX ) injection  80 mg Subcutaneous Q24H   guaiFENesin   1,200 mg Oral BID   insulin  aspart  0-15 Units Subcutaneous TID WC   insulin  aspart  0-5 Units Subcutaneous QHS   ipratropium-albuterol   3 mL Nebulization BID   latanoprost   1 drop Both Eyes QHS   pantoprazole   40 mg Oral Daily   polyethylene glycol  17 g Oral BID   QUEtiapine   400 mg Oral QHS   rosuvastatin   20 mg Oral QHS   senna-docusate  1 tablet Oral BID   spironolactone   25 mg Oral Daily   torsemide   20 mg Oral Daily   Continuous Infusions:   ceFAZolin  (ANCEF ) IV 2 g (08/19/24 0601)   linezolid  (ZYVOX ) IV 600 mg (08/19/24 0907)    LOS: 6 days   Alejandro Marker, DO [facility] Available via Epic secure chat 7am-7pm After these hours, please refer to coverage provider listed on [email or web address] 08/19/2024, 12:59 PM  "

## 2024-08-19 NOTE — Plan of Care (Signed)
" °  Problem: Coping: Goal: Ability to adjust to condition or change in health will improve Outcome: Progressing   Problem: Nutritional: Goal: Maintenance of adequate nutrition will improve Outcome: Progressing   Problem: Clinical Measurements: Goal: Ability to maintain clinical measurements within normal limits will improve Outcome: Progressing   Problem: Pain Managment: Goal: General experience of comfort will improve and/or be controlled Outcome: Progressing   Problem: Safety: Goal: Ability to remain free from injury will improve Outcome: Progressing   "

## 2024-08-20 ENCOUNTER — Other Ambulatory Visit (HOSPITAL_COMMUNITY): Payer: Self-pay

## 2024-08-20 LAB — CBC WITH DIFFERENTIAL/PLATELET
Abs Immature Granulocytes: 0.09 10*3/uL — ABNORMAL HIGH (ref 0.00–0.07)
Basophils Absolute: 0.1 10*3/uL (ref 0.0–0.1)
Basophils Relative: 1 %
Eosinophils Absolute: 0.5 10*3/uL (ref 0.0–0.5)
Eosinophils Relative: 5 %
HCT: 42.1 % (ref 39.0–52.0)
Hemoglobin: 12.6 g/dL — ABNORMAL LOW (ref 13.0–17.0)
Immature Granulocytes: 1 %
Lymphocytes Relative: 16 %
Lymphs Abs: 1.5 10*3/uL (ref 0.7–4.0)
MCH: 25.7 pg — ABNORMAL LOW (ref 26.0–34.0)
MCHC: 29.9 g/dL — ABNORMAL LOW (ref 30.0–36.0)
MCV: 85.9 fL (ref 80.0–100.0)
Monocytes Absolute: 0.8 10*3/uL (ref 0.1–1.0)
Monocytes Relative: 9 %
Neutro Abs: 6.5 10*3/uL (ref 1.7–7.7)
Neutrophils Relative %: 68 %
Platelets: 285 10*3/uL (ref 150–400)
RBC: 4.9 MIL/uL (ref 4.22–5.81)
RDW: 14.3 % (ref 11.5–15.5)
WBC: 9.4 10*3/uL (ref 4.0–10.5)
nRBC: 0 % (ref 0.0–0.2)

## 2024-08-20 LAB — COMPREHENSIVE METABOLIC PANEL WITH GFR
ALT: 17 U/L (ref 0–44)
AST: 35 U/L (ref 15–41)
Albumin: 3.4 g/dL — ABNORMAL LOW (ref 3.5–5.0)
Alkaline Phosphatase: 69 U/L (ref 38–126)
Anion gap: 8 (ref 5–15)
BUN: 22 mg/dL (ref 8–23)
CO2: 32 mmol/L (ref 22–32)
Calcium: 9.2 mg/dL (ref 8.9–10.3)
Chloride: 98 mmol/L (ref 98–111)
Creatinine, Ser: 1.15 mg/dL (ref 0.61–1.24)
GFR, Estimated: >60 mL/min
Glucose, Bld: 102 mg/dL — ABNORMAL HIGH (ref 70–99)
Potassium: 4.7 mmol/L (ref 3.5–5.1)
Sodium: 138 mmol/L (ref 135–145)
Total Bilirubin: 0.3 mg/dL (ref 0.0–1.2)
Total Protein: 7.6 g/dL (ref 6.5–8.1)

## 2024-08-20 LAB — GLUCOSE, CAPILLARY
Glucose-Capillary: 100 mg/dL — ABNORMAL HIGH (ref 70–99)
Glucose-Capillary: 122 mg/dL — ABNORMAL HIGH (ref 70–99)
Glucose-Capillary: 102 mg/dL — ABNORMAL HIGH (ref 70–99)
Glucose-Capillary: 119 mg/dL — ABNORMAL HIGH (ref 70–99)

## 2024-08-20 LAB — FERRITIN: Ferritin: 319 ng/mL (ref 24–336)

## 2024-08-20 LAB — RETICULOCYTES
Immature Retic Fract: 22.7 % — ABNORMAL HIGH (ref 2.3–15.9)
RBC.: 4.83 MIL/uL (ref 4.22–5.81)
Retic Count, Absolute: 54.6 10*3/uL (ref 19.0–186.0)
Retic Ct Pct: 1.1 % (ref 0.4–3.1)

## 2024-08-20 LAB — VITAMIN B12: Vitamin B-12: 3228 pg/mL — ABNORMAL HIGH (ref 180–914)

## 2024-08-20 LAB — MAGNESIUM: Magnesium: 2.9 mg/dL — ABNORMAL HIGH (ref 1.7–2.4)

## 2024-08-20 LAB — PHOSPHORUS: Phosphorus: 3.4 mg/dL (ref 2.5–4.6)

## 2024-08-20 LAB — IRON AND TIBC
Iron: 50 ug/dL (ref 45–182)
Saturation Ratios: 18 % (ref 17.9–39.5)
TIBC: 284 ug/dL (ref 250–450)
UIBC: 234 ug/dL

## 2024-08-20 LAB — FOLATE: Folate: 11.4 ng/mL

## 2024-08-20 MED ORDER — OXYCODONE HCL 5 MG PO TABS
5.0000 mg | ORAL_TABLET | Freq: Four times a day (QID) | ORAL | 0 refills | Status: AC | PRN
  Filled 2024-08-20 – 2024-08-23 (×2): qty 12, 3d supply, fill #0

## 2024-08-20 MED ORDER — GUAIFENESIN ER 600 MG PO TB12
600.0000 mg | ORAL_TABLET | Freq: Two times a day (BID) | ORAL | 0 refills | Status: AC
  Filled 2024-08-20 – 2024-08-23 (×2): qty 10, 5d supply, fill #0

## 2024-08-20 MED ORDER — ONDANSETRON HCL 4 MG PO TABS
4.0000 mg | ORAL_TABLET | Freq: Four times a day (QID) | ORAL | 0 refills | Status: AC | PRN
  Filled 2024-08-20 – 2024-08-23 (×2): qty 20, 5d supply, fill #0

## 2024-08-20 MED ORDER — SENNOSIDES-DOCUSATE SODIUM 8.6-50 MG PO TABS
1.0000 | ORAL_TABLET | Freq: Every day | ORAL | 0 refills | Status: AC
  Filled 2024-08-20 – 2024-08-23 (×2): qty 30, 30d supply, fill #0

## 2024-08-20 MED ORDER — POLYETHYLENE GLYCOL 3350 17 GM/SCOOP PO POWD
17.0000 g | Freq: Two times a day (BID) | ORAL | 0 refills | Status: AC
  Filled 2024-08-20 – 2024-08-23 (×2): qty 238, 7d supply, fill #0

## 2024-08-20 MED ORDER — ACETAMINOPHEN 325 MG PO TABS
650.0000 mg | ORAL_TABLET | Freq: Four times a day (QID) | ORAL | 0 refills | Status: AC | PRN
  Filled 2024-08-20 – 2024-08-23 (×2): qty 20, 3d supply, fill #0

## 2024-08-20 MED ORDER — CEFADROXIL 500 MG PO CAPS
1000.0000 mg | ORAL_CAPSULE | Freq: Two times a day (BID) | ORAL | 0 refills | Status: AC
  Filled 2024-08-20 – 2024-08-23 (×2): qty 12, 3d supply, fill #0

## 2024-08-20 MED ORDER — LINEZOLID 600 MG PO TABS
600.0000 mg | ORAL_TABLET | Freq: Two times a day (BID) | ORAL | 0 refills | Status: AC
  Filled 2024-08-20 – 2024-08-23 (×2): qty 6, 3d supply, fill #0

## 2024-08-20 NOTE — Progress Notes (Signed)
 Occupational Therapy Treatment Patient Details Name: MELBURN TREIBER MRN: [id number] DOB: [birth year] Today's Date: 08/20/2024   History of present illness 74 y.o. male with medical history significant for heart failure with reduced EF not recovered, DM2, hypertension, OSA, morbid obesity and stasis dermatitis with sacral decubitus ulcer being admitted to the hospital with leg pain due to right lower extremity cellulitis.  Patient was just discharged from the hospital on 1/7 after a stay for acute left-sided heart failure, diuresed successfully and discharged back home.   OT comments  Returned to pt's room after toileting to focus on sit<>stand transitions and functional mobility with co-tx with PT completed in order to progress. Lowered handles on Rolator to facilitate a more upright posture. Provided intermittent education throughout session regarding standing posture. Pt able to self correct with cueing. Patient will benefit from continued inpatient follow up therapy, <3 hours/day.       If plan is discharge home, recommend the following:  A lot of help with walking and/or transfers;A lot of help with bathing/dressing/bathroom;Help with stairs or ramp for entrance;Assistance with cooking/housework   Equipment Recommendations  None recommended by OT       Precautions / Restrictions Precautions Precautions: Fall Recall of Precautions/Restrictions: Intact Restrictions Weight Bearing Restrictions Per Provider Order: No       Mobility Bed Mobility  General bed mobility comments: Started session from [facility] and ended in recliner    Transfers Overall transfer level: Needs assistance Equipment used: Rollator (4 wheels) Transfers: Sit to/from Stand Sit to Stand: From elevated surface, Min assist, +2 safety/equipment, +2 physical assistance    General transfer comment: Slow and labored movement to power up into standing from [facility] utilizing Rolator. No VC required for hand placement.      Balance Overall balance assessment: Needs assistance Sitting-balance support: No upper extremity supported, Feet supported Sitting balance-Leahy Scale: Good Sitting balance - Comments: Sitting on BSC   Standing balance support: Bilateral upper extremity supported, During functional activity, Reliant on assistive device for balance Standing balance-Leahy Scale: Poor Standing balance comment: reliant on rolator      ADL either performed or assessed with clinical judgement   ADL     Toilet Transfer: Minimal assistance;+2 for safety/equipment;Ambulation;Rollator (4 wheels);BSC/3in1   Toileting- Architect and Hygiene: Total assistance;Sit to/from stand       Functional mobility during ADLs: Contact guard assist;Cueing for safety;Rollator (4 wheels) (with chair follow)             Communication Communication Communication: No apparent difficulties Factors Affecting Communication: Reduced clarity of speech   Cognition Arousal: Alert Behavior During Therapy: WFL for tasks assessed/performed Cognition: No apparent impairments      Following commands: Intact        Cueing   Cueing Techniques: Verbal cues        General Comments VSS on RA    Pertinent Vitals/ Pain       Pain Assessment Pain Assessment: Faces Faces Pain Scale: Hurts little more Pain Location: R LE Pain Descriptors / Indicators: Discomfort, Grimacing Pain Intervention(s): Limited activity within patient's tolerance, Monitored during session, Patient requesting pain meds-RN notified, Repositioned         Frequency  Min 2X/week        Progress Toward Goals  OT Goals(current goals can now be found in the care plan section)  Progress towards OT goals: Progressing toward goals         Co-evaluation    PT/OT/SLP Co-Evaluation/Treatment: Yes Reason for Co-Treatment:  For patient/therapist safety;To address functional/ADL transfers PT goals addressed during session: Mobility/safety  with mobility;Balance;Proper use of DME OT goals addressed during session: ADL's and self-care      AM-PAC OT 6 Clicks Daily Activity     Outcome Measure   Help from another person eating meals?: None Help from another person taking care of personal grooming?: None Help from another person toileting, which includes using toliet, bedpan, or urinal?: Total Help from another person bathing (including washing, rinsing, drying)?: A Lot Help from another person to put on and taking off regular upper body clothing?: A Little Help from another person to put on and taking off regular lower body clothing?: Total 6 Click Score: 15    End of Session Equipment Utilized During Treatment: Gait belt;Rollator (4 wheels)  OT Visit Diagnosis: Other abnormalities of gait and mobility (R26.89);Muscle weakness (generalized) (M62.81);Pain Pain - Right/Left: Right Pain - part of body: Ankle and joints of foot   Activity Tolerance Patient tolerated treatment well   Patient Left in chair;with chair alarm set   Nurse Communication Patient requests pain meds        Time: 8679-8658 OT Time Calculation (min): 21 min  Charges: OT General Charges $OT Visit: 1 Visit OT Treatments $Self Care/Home Management : 8-22 mins  Leita Howell, OTR/L,CBIS  Supplemental OT - [facility] and [facility] Secure Chat Preferred    Lawrance Wiedemann, Leita BIRCH 08/20/2024, 2:40 PM

## 2024-08-20 NOTE — Progress Notes (Addendum)
 Occupational Therapy Treatment Patient Details Name: Christopher Riley MRN: [id number] DOB: 06-30-1951 Today's Date: 08/20/2024   History of present illness 74 y.o. male with medical history significant for heart failure with reduced EF not recovered, DM2, hypertension, OSA, morbid obesity and stasis dermatitis with sacral decubitus ulcer being admitted to the hospital with leg pain due to right lower extremity cellulitis.  Patient was just discharged from the hospital on 1/7 after a stay for acute left-sided heart failure, diuresed successfully and discharged back home.   OT comments  Pt in bed upon therapy arrival and reports recent toileting assistance. Session focused on bed mobility while rolling to provide toileting hygiene prior to transitioning to seated EOB. Co-tx completed with PT to progress mobility and OOB activity. Pt requested to use BSC once standing with Rolator. Requested some time to use. Therapist's stepped out of bed to allow pt to utilize [facility]. During session, pt was changed his mind regarding discharge plan and is agreeable to post acute rehab before returning home. Patient will benefit from continued inpatient follow up therapy, <3 hours/day.       If plan is discharge home, recommend the following:  A lot of help with walking and/or transfers;A lot of help with bathing/dressing/bathroom;Help with stairs or ramp for entrance;Assistance with cooking/housework   Equipment Recommendations  None recommended by OT       Precautions / Restrictions Precautions Precautions: Fall Recall of Precautions/Restrictions: Intact Restrictions Weight Bearing Restrictions Per Provider Order: No       Mobility Bed Mobility Overal bed mobility: Needs Assistance Bed Mobility: Supine to Sit, Rolling     Supine to sit: HOB elevated, Used rails, Min assist, +2 for physical assistance     General bed mobility comments: increased time to complete with HOB elevated all the way. Assist  to bring right hand over to left sided bed rail.    Transfers Overall transfer level: Needs assistance Equipment used: Rollator (4 wheels) Transfers: Sit to/from Stand Sit to Stand: From elevated surface, Min assist, +2 safety/equipment, +2 physical assistance    General transfer comment: Slow and labored movement to power up into standing.     Balance Overall balance assessment: Needs assistance Sitting-balance support: No upper extremity supported, Feet supported Sitting balance-Leahy Scale: Good Sitting balance - Comments: sitting EOB   Standing balance support: Bilateral upper extremity supported, During functional activity, Reliant on assistive device for balance Standing balance-Leahy Scale: Poor Standing balance comment: reliant on rolator      ADL either performed or assessed with clinical judgement   ADL    Toilet Transfer: Minimal assistance;+2 for safety/equipment;Ambulation;Rollator (4 wheels);BSC/3in1   Toileting- Architect and Hygiene: Total assistance;bed  level       Functional mobility during ADLs: Contact guard assist;Cueing for safety;Rollator (4 wheels) (with chair follow)                Communication Communication Communication: No apparent difficulties Factors Affecting Communication: Reduced clarity of speech   Cognition Arousal: Alert Behavior During Therapy: WFL for tasks assessed/performed Cognition: No apparent impairments      Following commands: Intact        Cueing   Cueing Techniques: Verbal cues        General Comments VSS on RA    Pertinent Vitals/ Pain       Pain Assessment Pain Assessment: Faces Faces Pain Scale: Hurts little more Pain Location: R LE Pain Descriptors / Indicators: Discomfort, Grimacing Pain Intervention(s): Limited activity within patient's tolerance,  Monitored during session         Frequency  Min 2X/week        Progress Toward Goals  OT Goals(current goals can now be found  in the care plan section)  Progress towards OT goals: Progressing toward goals         Co-evaluation    PT/OT/SLP Co-Evaluation/Treatment: Yes Reason for Co-Treatment: For patient/therapist safety;To address functional/ADL transfers PT goals addressed during session: Mobility/safety with mobility;Balance;Proper use of DME OT goals addressed during session: ADL's and self-care      AM-PAC OT 6 Clicks Daily Activity     Outcome Measure   Help from another person eating meals?: None Help from another person taking care of personal grooming?: None Help from another person toileting, which includes using toliet, bedpan, or urinal?: Total Help from another person bathing (including washing, rinsing, drying)?: A Lot Help from another person to put on and taking off regular upper body clothing?: A Little Help from another person to put on and taking off regular lower body clothing?: Total 6 Click Score: 15    End of Session Equipment Utilized During Treatment: Gait belt;Rollator (4 wheels)  OT Visit Diagnosis: Other abnormalities of gait and mobility (R26.89);Muscle weakness (generalized) (M62.81);Pain Pain - Right/Left: Right Pain - part of body: Ankle and joints of foot   Activity Tolerance Patient tolerated treatment well   Patient Left Other (comment) (BSC at bedside with therapists waiting in hallway)   Nurse Communication Mobility status        Time: 8787-8742 OT Time Calculation (min): 45 min  Charges: OT General Charges $OT Visit: 1 Visit OT Treatments $Self Care/Home Management : 8-22 mins  Leita Howell, OTR/L,CBIS  Supplemental OT - [facility] and [facility] Secure Chat Preferred    Lori Liew, Leita BIRCH 08/20/2024, 2:28 PM

## 2024-08-20 NOTE — TOC Progression Note (Addendum)
 Transition of Care [facility]) - Progression Note    Patient Details  Name: Christopher Riley MRN: [id number] Date of Birth: [birth year]  Transition of Care [facility] CM/SW Contact  [name], RN Phone Number: 08/20/2024, 10:36 AM  Clinical Narrative:   Call to patient's room to revisit PT recommendation for short term rehab, patient again declines SNF, states she has someone at home to assist him at discharge. HH referrals faxed out in [facility].   -4:00pm PT session completed, per PT, patient now agreeable to short term rehab/SNF. FL2 updated, Level 2 PASRR pending, faxed out for bed offers.     Expected Discharge Plan: Home w Home Health Services                 Expected Discharge Plan and Services       Living arrangements for the past 2 months: Single Family Home                                       Social Drivers of Health (SDOH) Interventions SDOH Screenings   Food Insecurity: No Food Insecurity (08/13/2024)  Housing: Low Risk (08/13/2024)  Transportation Needs: Unmet Transportation Needs (08/13/2024)  Utilities: Not At Risk (08/13/2024)  Social Connections: Socially Isolated (08/13/2024)  Tobacco Use: Low Risk (08/13/2024)    Readmission Risk Interventions     No data to display

## 2024-08-20 NOTE — TOC PASRR Note (Signed)
 30 Day PASRR Note   Patient Details  Name: Christopher Riley Date of Birth: 08-07-1950   Transition of Care [facility]) CM/SW Contact:    [name], RN Phone Number: 08/20/2024, 4:01 PM  To Whom It May Concern:  Please be advised that this patient will require a short-term nursing home stay - anticipated 30 days or less for rehabilitation and strengthening.   The plan is for return home.

## 2024-08-20 NOTE — Plan of Care (Signed)
   Problem: Coping: Goal: Ability to adjust to condition or change in health will improve Outcome: Progressing   Problem: Nutritional: Goal: Maintenance of adequate nutrition will improve Outcome: Progressing   Problem: Skin Integrity: Goal: Risk for impaired skin integrity will decrease Outcome: Progressing   Problem: Activity: Goal: Risk for activity intolerance will decrease Outcome: Progressing

## 2024-08-20 NOTE — Progress Notes (Signed)
 " PROGRESS NOTE    Christopher Riley  FMW:[id number] DOB: 10-28-1950 DOA: 08/13/2024 PCP: Claude Reagin, NP   Brief Narrative:  Christopher Riley is a 74 y.o. male with medical history significant for heart failure with reduced EF not recovered, DM2, hypertension, OSA, morbid obesity and stasis dermatitis with sacral decubitus ulcer being admitted to the hospital with leg pain due to right lower extremity cellulitis.  Patient was just discharged from the hospital on 1/7 after a stay for acute left-sided heart failure, diuresed successfully and discharged back home.   Workup in ED with leukocytosis, CT scan consistent with leg edema and cellulitis.  Blood cultures were drawn and patient was started on Rocephin  and vancomycin .  1/20: Patient became febrile last night up to 103, preliminary blood cultures negative.  CTA abdomen was negative for any significant stenosis.  More consistent with cellulitis and inguinal lymphadenopathy.  1/21: De-escalating Abx to IV Cefazolin  and Linezolid .   1/22: Had some Dyspnea and CXR showed mild cardiovascular congestion so will give a Dose of IV Lasix  in addition to Torsemide .   1/23: Leg is improving on antibiotics.  Will give an additional dose of IV Lasix  today.  1/24: Leg is further improving and Cellulitis is much improve. Going to give another dose of IV Furosemide .  1/25: Will get another dose of IV Furosemide . Bowel regimen initiated.  1/26: He subsequently improved and was deemed medically stable for discharge possible to be discharged home but none upon reassessment he decided to change his mind and is now agreeable to SNF.  TOC assisting with SNF placement.  Assessment and Plan:  Sepsis due to Cellulitis [facility]): Improving. Patient met sepsis criteria with fever, leukocytosis and tachycardia.  Likely secondary to right lower extremity cellulitis as evident on imaging.  Preliminary blood cultures negative. Continued with IV ceftriaxone  and  vancomycin  but will change to Cefazolin  and Linezolid ; Leg is slowly improving daily and not as erythematous; Will complete 10 days of Tx -Will need to be OOB and Elevate Extremity  -WBC resolved and last check was 9.4 -Continue with supportive care -PT/OT evaluation ordered and recommending SNF and he was refusing at this time however it subsequently changed his mind after reassessment today.  Willing to go to SNF for rehabilitation -Right lower extremity venous Doppler ordered and showed no DVT -He is medically stable for discharge.   Acute on Chronic Heart Failure with preserved ejection fraction (HFpEF) (HCC) Recent echo with normal EF and indeterminate diastolic function.  Recent admission secondary to CHF Exacerbation. -Avoid excessive IV fluid -Pro-BNP is now 73.9 -Continue home Coreg , Crestor , Aldactone  and torsemide  and Jardiance . Strict I's and O's and daily Weights.  Stop the IV furosemide  that he was getting and discontinuing the p.o. torsemide  10 mg daily.  He received IV Furosemide  40 mg twice daily x 4 days and now appears to be very euvolemic Intake/Output Summary (Last 24 hours) at 08/20/2024 1250 Last data filed at 08/20/2024 0931 Gross per 24 hour  Intake 1740 ml  Output 3350 ml  Net -1610 ml  -CXR as below  Dyspnea and Wheezing: Improving respiratory Status. Repeat CXR 1/25 showed Enlargement of the cardiopericardial silhouette with pulmonary vascular congestion. -Add DuoNebs scheduled q6h, Flutter Valve, Incentive Spirometry, and Guaifenesin  1200 mg po BID; C/w Albuterol  Nebs q2hprn -Repeat CXR in the AM   Constipation: She has bowel regimen with Senna-Docusate 1 tab p.o. twice daily, MiraLAX  17 g p.o. twice daily, and Bisacodyl  10 mg RC Dailyprn  Hyperkalemia:  K+ is now improved and 4.7 on last check. Getting a dose of IV lasix . May need to Hold Spironolactone  if persists. CTM & Trend & repeat CMP in the AM    Essential Hypertension: Blood pressure currently within  goal. -Continuing home Coreg , Aldactone  and torsemide . CTM BP per Protocol. Last BP reading was 122/68   Paroxysmal Atrial Fibrillation [facility]): Per documentation patient has declined DOAC in the past.  Also has history of GI bleed. Continue with Carvedilol  3.125 mg po BID. Not on Telemetry   Type 2 Diabetes Mellitus (HCC): Seems well-controlled with CBG within goal and most recent A1c of 6.6 this hospitalization. Continue with SSI. CTM CBGs per protocol. CBG Trend ranging from 98-129   Renal Insufficiency: Fluctuating BUN/Cr Trend: Recent Labs  Lab 08/13/24 2231 08/14/24 2115 08/15/24 2102 08/17/24 0506 08/18/24 0438 08/19/24 0453 08/20/24 0413  BUN 16 20 19 18 18 18 22   CREATININE 1.05 1.29* 1.29* 1.23 1.09 1.17 1.15  -Avoid Nephrotoxic Medications, Contrast Dyes, Hypotension and Dehydration to Ensure Adequate Renal Perfusion and will need to Renally Adjust Meds -Continue to Monitor and Trend Renal Function carefully and repeat CMP intermittently  Dyslipidemia: Continue with home Rosuvastatin  20 mg po qHS   Depression with Anxiety:  Continue home Queitapine 400 mg po qHS and 2 mg po Clonazepam  BIDprn   Normocytic Anemia: Hgb/Hct Trend:  Recent Labs  Lab 08/13/24 2231 08/14/24 2115 08/15/24 2102 08/17/24 0506 08/18/24 0438 08/19/24 0453 08/20/24 0413  HGB 11.9* 11.4* 12.1* 13.0 11.7* 12.2* 12.6*  HCT 38.3* 37.7* 39.6 40.3 38.9* 40.2 42.1  MCV 85.5 85.9 85.5 83.4 85.5 86.3 85.9  -Check Anemia Panel and iron levels 50, UIBC 234, TIBC 284, saturation which was 18%, ferritin of 319, folate levels 11.4 and vitamin B12 was 3228. CTM & Trend and Repeat CBC intermittently if still here  GERD (Gastroesophageal Reflux disease)/GI Prophylaxis: Continue PPI w/ Pantoprazole  40 mg po Daily    OSA (obstructive sleep apnea):  CPAP at night  Hypoalbuminemia: Patient's Albumin Lvl went from 3.9 -> 3.5 -> 3.4 -> 3.3 x2. CTM & Trend & Repeat CMP in the AM  Pressure Ulcer:  Wound 08/15/24  Pressure Injury Buttocks Left Stage 3 -  Full thickness tissue loss. Subcutaneous fat may be visible but bone, tendon or muscle are NOT exposed. (Active)    Class III (Morbid) Obesity: Complicates overall prognosis and care. Estimated body mass index is 48.2 kg/m as calculated from the following:   Height as of this encounter: 6' 2 (1.88 m).   Weight as of this encounter: 170.3 kg. Weight Loss and Dietary Counseling given   DVT prophylaxis: Enoxaparin  80 mg subcu daily     Code Status: Full Code Family Communication: No family present at bedside  Disposition Plan:  Level of care: Med-Surg Status is: Inpatient Remains inpatient appropriate because: He is now medically stable for discharge and will be discharged home with home health services however given how weak he is and how difficult it is for him to mobilize he has not changed his mind and agreeable to SNF placement for rehabilitation.  TOC consulted and awaiting bed offers and insurance authorization   Consultants:  None  Procedures:  As delineated as above  Antimicrobials:  Anti-infectives (From admission, onward)    Start     Dose/Rate Route Frequency Ordered Stop   08/20/24 0000  linezolid  (ZYVOX ) 600 MG tablet        600 mg Oral 2 times daily 08/20/24 1226 08/23/24 2359  08/20/24 0000  cefadroxil  (DURICEF) 500 MG capsule        1,000 mg Oral 2 times daily 08/20/24 1226 08/23/24 2359   08/15/24 2200  linezolid  (ZYVOX ) IVPB 600 mg        600 mg 300 mL/hr over 60 Minutes Intravenous Every 12 hours 08/15/24 2024 08/22/24 2359   08/15/24 2200  ceFAZolin  (ANCEF ) IVPB 2g/100 mL premix        2 g 200 mL/hr over 30 Minutes Intravenous Every 8 hours 08/15/24 2029 08/22/24 2359   08/15/24 1000  vancomycin  (VANCOREADY) IVPB 2000 mg/400 mL  Status:  Discontinued        2,000 mg 200 mL/hr over 120 Minutes Intravenous Every 24 hours 08/15/24 0828 08/15/24 2024   08/14/24 1000  vancomycin  (VANCOCIN ) 2,500 mg in sodium chloride  0.9  % 500 mL IVPB  Status:  Discontinued        2,500 mg 262.5 mL/hr over 120 Minutes Intravenous Every 24 hours 08/13/24 0826 08/15/24 0828   08/13/24 0815  cefTRIAXone  (ROCEPHIN ) 2 g in sodium chloride  0.9 % 100 mL IVPB  Status:  Discontinued        2 g 200 mL/hr over 30 Minutes Intravenous Every 24 hours 08/13/24 0808 08/15/24 2024   08/13/24 0800  vancomycin  (VANCOCIN ) 2,500 mg in sodium chloride  0.9 % 500 mL IVPB        2,500 mg 262.5 mL/hr over 120 Minutes Intravenous  Once 08/13/24 0748 08/13/24 1044   08/13/24 0545  cefTRIAXone  (ROCEPHIN ) 2 g in sodium chloride  0.9 % 100 mL IVPB        2 g 200 mL/hr over 30 Minutes Intravenous  Once 08/13/24 0542 08/13/24 9376       Subjective: Seen and examined at bedside states he is doing much better.  Slept fairly well and had a very large bowel movement overnight.  Denying chest pain or shortness of breath.  Wanting to go home but then after PT and OT reassessment today he has changed his mind  Objective: Vitals:   08/19/24 2049 08/19/24 2118 08/20/24 0601 08/20/24 1309  BP: 137/77  135/76 122/68  Pulse: 80  78 99  Resp: 16  16 19   Temp: 97.6 F (36.4 C)  98.5 F (36.9 C) 97.9 F (36.6 C)  TempSrc: Oral   Oral  SpO2: 96% 97% 93% 92%  Weight:      Height:        Intake/Output Summary (Last 24 hours) at 08/20/2024 1634 Last data filed at 08/20/2024 1325 Gross per 24 hour  Intake 1343.61 ml  Output 3050 ml  Net -1706.39 ml   Filed Weights   08/15/24 0732  Weight: (!) 170.3 kg   Examination: Physical Exam:  Constitutional: WN/WD morbidly obese African-American male who appears calm Respiratory: Diminished to auscultation bilaterally, no wheezing, rales, rhonchi or crackles. Normal respiratory effort and patient is not tachypenic. No accessory muscle use.  Unlabored breathing Cardiovascular: RRR, no murmurs / rubs / gallops. S1 and S2 auscultated.  Minimal extremity edema Abdomen: Soft, non-tender, distended secondary body  habitus. Bowel sounds positive.  GU: Deferred. Musculoskeletal: No clubbing / cyanosis of digits/nails. No joint deformity upper and lower extremities.  Legs have definitely appearance consistent with chronic venous stasis and volume overload Skin: No rashes, lesions, ulcers on limited skin evaluation did not turn to use buttock. No induration; Warm and dry.  Neurologic: CN 2-12 grossly intact with no focal deficit but he is partially blind. Romberg sign and cerebellar reflexes  not assessed.  Psychiatric: Normal judgment and insight. Alert and oriented x 3. Normal mood and appropriate affect.   Data Reviewed: I have personally reviewed following labs and imaging studies  CBC: Recent Labs  Lab 08/15/24 2102 08/17/24 0506 08/18/24 0438 08/19/24 0453 08/20/24 0413  WBC 13.7* 9.5 9.1 9.4 9.4  NEUTROABS 11.0* 6.9 6.4 5.9 6.5  HGB 12.1* 13.0 11.7* 12.2* 12.6*  HCT 39.6 40.3 38.9* 40.2 42.1  MCV 85.5 83.4 85.5 86.3 85.9  PLT 259 262 255 270 285   Basic Metabolic Panel: Recent Labs  Lab 08/15/24 2102 08/17/24 0506 08/18/24 0438 08/19/24 0453 08/20/24 0413  NA 139 138 139 139 138  K 5.2* 4.4 4.6 5.1 4.7  CL 100 97* 99 99 98  CO2 29 29 31  34* 32  GLUCOSE 102* 109* 107* 96 102*  BUN 19 18 18 18 22   CREATININE 1.29* 1.23 1.09 1.17 1.15  CALCIUM  8.9 8.9 8.9 9.1 9.2  MG 2.2 2.4 2.6* 2.7* 2.9*  PHOS 3.6 3.4 3.3 3.6 3.4   GFR: Estimated Creatinine Clearance: 92.2 mL/min (by C-G formula based on SCr of 1.15 mg/dL). Liver Function Tests: Recent Labs  Lab 08/15/24 2102 08/17/24 0506 08/18/24 0438 08/19/24 0453 08/20/24 0413  AST 20 25 61* 42* 35  ALT 17 15 20 18 17   ALKPHOS 70 71 75 65 69  BILITOT 0.3 0.3 0.3 0.3 0.3  PROT 7.3 7.5 7.3 7.1 7.6  ALBUMIN 3.5 3.4* 3.3* 3.3* 3.4*   No results for input(s): LIPASE, AMYLASE in the last 168 hours. No results for input(s): AMMONIA in the last 168 hours. Coagulation Profile: No results for input(s): INR, PROTIME in the  last 168 hours. Cardiac Enzymes: No results for input(s): CKTOTAL, CKMB, CKMBINDEX, TROPONINI in the last 168 hours. BNP (last 3 results) Recent Labs    07/21/24 0039 07/28/24 1525 08/19/24 0453  PROBNP 351.0* 180.0 73.9   HbA1C: No results for input(s): HGBA1C in the last 72 hours. CBG: Recent Labs  Lab 08/19/24 1222 08/19/24 1650 08/19/24 2109 08/20/24 0738 08/20/24 1132  GLUCAP 98 124* 122* 100* 122*   Lipid Profile: No results for input(s): CHOL, HDL, LDLCALC, TRIG, CHOLHDL, LDLDIRECT in the last 72 hours. Thyroid Function Tests: No results for input(s): TSH, T4TOTAL, FREET4, T3FREE, THYROIDAB in the last 72 hours. Anemia Panel: Recent Labs    08/20/24 0413  VITAMINB12 3,228*  FOLATE 11.4  FERRITIN 319  TIBC 284  IRON 50  RETICCTPCT 1.1   Sepsis Labs: No results for input(s): PROCALCITON, LATICACIDVEN in the last 168 hours.  Recent Results (from the past 240 hours)  Culture, blood (Routine X 2) w Reflex to ID Panel     Status: None   Collection Time: 08/13/24  5:26 AM   Specimen: BLOOD  Result Value Ref Range Status   Specimen Description   Final    BLOOD LEFT ANTECUBITAL Performed at [facility], [address], [place], [place] [address]    Special Requests   Final    BOTTLES DRAWN AEROBIC AND ANAEROBIC Blood Culture results may not be optimal due to an inadequate volume of blood received in culture bottles Performed at [facility] [address] [place], [place] [address]    Culture   Final    NO GROWTH 5 DAYS Performed at [facility], [address] [place], [place] [address]    Report Status 08/18/2024 FINAL  Final  Culture, blood (Routine X 2) w Reflex to  ID Panel     Status: None   Collection Time: 08/13/24  5:37 AM   Specimen: BLOOD  Result Value Ref Range Status   Specimen Description   Final    BLOOD RIGHT ANTECUBITAL Performed at [facility], [address] [place], [place] [address]    Special Requests   Final    BOTTLES DRAWN AEROBIC ONLY Blood Culture results may not be optimal due to an inadequate volume of blood received in culture bottles Performed at [facility], [address], [place], [place] [address]    Culture   Final    NO GROWTH 5 DAYS Performed at [facility], [address], [place], [place] [address]    Report Status 08/18/2024 FINAL  Final  Resp panel by RT-PCR (RSV, Flu A&B, Covid) Anterior Nasal Swab     Status: None   Collection Time: 08/13/24 10:49 PM   Specimen: Anterior Nasal Swab  Result Value Ref Range Status   SARS Coronavirus 2 by RT PCR NEGATIVE NEGATIVE Final    Comment: (NOTE) SARS-CoV-2 target nucleic acids are NOT DETECTED.  The SARS-CoV-2 RNA is generally detectable in upper respiratory specimens during the acute phase of infection. The lowest concentration of SARS-CoV-2 viral copies this assay can detect is 138 copies/mL. A negative result does not preclude SARS-Cov-2 infection and should not be used as the sole basis for treatment or other patient management decisions. A negative result may occur with  improper specimen collection/handling, submission of specimen other than nasopharyngeal swab, presence of viral mutation(s) within the areas targeted by this assay, and inadequate number of viral copies(<138 copies/mL). A negative result must be combined with clinical observations, patient history, and epidemiological information. The expected result is Negative.  Fact Sheet for Patients:  bloggercourse.com  Fact Sheet for Healthcare Providers:  seriousbroker.it  This test is no t yet approved or cleared by the [place]  FDA and  has been authorized for detection and/or diagnosis of SARS-CoV-2 by FDA under an Emergency Use Authorization (EUA). This EUA will remain  in effect (meaning this  test can be used) for the duration of the COVID-19 declaration under Section 564(b)(1) of the Act, 21 U.S.C.section 360bbb-3(b)(1), unless the authorization is terminated  or revoked sooner.       Influenza A by PCR NEGATIVE NEGATIVE Final   Influenza B by PCR NEGATIVE NEGATIVE Final    Comment: (NOTE) The Xpert Xpress SARS-CoV-2/FLU/RSV plus assay is intended as an aid in the diagnosis of influenza from Nasopharyngeal swab specimens and should not be used as a sole basis for treatment. Nasal washings and aspirates are unacceptable for Xpert Xpress SARS-CoV-2/FLU/RSV testing.  Fact Sheet for Patients: bloggercourse.com  Fact Sheet for Healthcare Providers: seriousbroker.it  This test is not yet approved or cleared by the [place]  FDA and has been authorized for detection and/or diagnosis of SARS-CoV-2 by FDA under an Emergency Use Authorization (EUA). This EUA will remain in effect (meaning this test can be used) for the duration of the COVID-19 declaration under Section 564(b)(1) of the Act, 21 U.S.C. section 360bbb-3(b)(1), unless the authorization is terminated or revoked.     Resp Syncytial Virus by PCR NEGATIVE NEGATIVE Final    Comment: (NOTE) Fact Sheet for Patients: bloggercourse.com  Fact Sheet for Healthcare Providers: seriousbroker.it  This test is not yet approved or cleared by the [place]  FDA and has been authorized for detection and/or diagnosis of SARS-CoV-2 by FDA under an Emergency  Use Authorization (EUA). This EUA will remain in effect (meaning this test can be used) for the duration of the COVID-19 declaration under Section 564(b)(1) of the Act, 21 U.S.C. section 360bbb-3(b)(1), unless the authorization is terminated or revoked.  Performed at [facility], [address] [place], [place] [address]     Radiology  Studies: DG CHEST PORT 1 VIEW Result Date: 08/19/2024 CLINICAL DATA:  Congestive heart failure, shortness of breath. Leg edema and cellulitis. EXAM: PORTABLE CHEST 1 VIEW COMPARISON:  08/19/2024. FINDINGS: The heart size and mediastinal contours are stable. The pulmonary vasculature is mildly distended. Lung volumes are low with atelectasis at the right lung base. No consolidation, effusion, or pneumothorax is seen. No acute osseous abnormality. IMPRESSION: Cardiomegaly with mildly distended pulmonary vasculature. Electronically Signed   By: Leita Birmingham M.D.   On: 08/19/2024 16:10   DG CHEST PORT 1 VIEW Result Date: 08/19/2024 CLINICAL DATA:  Congestive heart failure. EXAM: PORTABLE CHEST 1 VIEW COMPARISON:  08/18/2024 FINDINGS: The cardio pericardial silhouette is enlarged. There is pulmonary vascular congestion without overt pulmonary edema. No focal consolidation or overt airspace pulmonary edema. No substantial pleural effusion. No acute bony abnormality. IMPRESSION: Enlargement of the cardiopericardial silhouette with pulmonary vascular congestion. Electronically Signed   By: Camellia Candle M.D.   On: 08/19/2024 05:31   Scheduled Meds:  carvedilol   3.125 mg Oral BID WC   collagenase    Topical Daily   empagliflozin   10 mg Oral Daily   enoxaparin  (LOVENOX ) injection  80 mg Subcutaneous Q24H   guaiFENesin   1,200 mg Oral BID   insulin  aspart  0-15 Units Subcutaneous TID WC   insulin  aspart  0-5 Units Subcutaneous QHS   ipratropium-albuterol   3 mL Nebulization BID   latanoprost   1 drop Both Eyes QHS   pantoprazole   40 mg Oral Daily   polyethylene glycol  17 g Oral BID   QUEtiapine   400 mg Oral QHS   rosuvastatin   20 mg Oral QHS   senna-docusate  1 tablet Oral BID   spironolactone   25 mg Oral Daily   torsemide   20 mg Oral Daily   Continuous Infusions:   ceFAZolin  (ANCEF ) IV 2 g (08/20/24 1500)   linezolid  (ZYVOX ) IV 600 mg (08/20/24 0955)    LOS: 7 days   Alejandro Marker, DO [facility] Available via Epic secure chat 7am-7pm After these hours, please refer to coverage provider listed on [email or web address] 08/20/2024, 4:34 PM  "

## 2024-08-20 NOTE — Progress Notes (Addendum)
 Physical Therapy Treatment Patient Details Name: Christopher Riley MRN: [id number] DOB: 08-24-1950 Today's Date: 08/20/2024   History of Present Illness 74 y.o. male with medical history significant for heart failure with reduced EF not recovered, DM2, hypertension, OSA, morbid obesity and stasis dermatitis with sacral decubitus ulcer being admitted to the hospital with leg pain due to right lower extremity cellulitis.  Patient was just discharged from the hospital on 1/7 after a stay for acute left-sided heart failure, diuresed successfully and discharged back home.    PT Comments  Pt very cooperative but progressing slowly with mobility and continues to require significant time and assist for performance of all basic mobility tasks.  This am, pt up to EOB sitting, to standing x 2 and ambulated limited distance before requiring use of BSC and assisted to same - pt requests re attempt ambulation a little later.  Pt also states did not realize how difficult it would be to mobilize and now agreeable to follow up at SNF level rehab.      If plan is discharge home, recommend the following: A lot of help with walking and/or transfers;A lot of help with bathing/dressing/bathroom;Assistance with cooking/housework;Assist for transportation;Help with stairs or ramp for entrance   Can travel by private vehicle     No  Equipment Recommendations  None recommended by PT    Recommendations for Other Services       Precautions / Restrictions Precautions Precautions: Fall Recall of Precautions/Restrictions: Intact Precaution/Restrictions Comments: pt denies falls in the past 6 months Restrictions Weight Bearing Restrictions Per Provider Order: No     Mobility  Bed Mobility Overal bed mobility: Needs Assistance Bed Mobility: Supine to Sit     Supine to sit: Mod assist, HOB elevated, Used rails     General bed mobility comments: increased time, HOB elevated, extensive use of bed rails, cues for  technique    Transfers Overall transfer level: Needs assistance Equipment used: Rollator (4 wheels) Transfers: Sit to/from Stand, Bed to chair/wheelchair/BSC Sit to Stand: From elevated surface, Min assist, +2 safety/equipment, +2 physical assistance           General transfer comment: Steady assist, increased time, difficulty with erect posture    Ambulation/Gait Ambulation/Gait assistance: Min assist, +2 physical assistance, +2 safety/equipment Gait Distance (Feet): 14 Feet Assistive device: Rollator (4 wheels) Gait Pattern/deviations: Decreased step length - right, Decreased step length - left, Decreased stride length, Trunk flexed, Wide base of support Gait velocity: decreased     General Gait Details: short steps near step through gait pattern, decreased cadence, flat foot posture with decreased heel-toe pattern, wide BOS and trunk forward flexed onto rollator; distance limited by need for [facility]   Stairs             Wheelchair Mobility     Tilt Bed    Modified Rankin (Stroke Patients Only)       Balance Overall balance assessment: Needs assistance Sitting-balance support: No upper extremity supported, Feet supported Sitting balance-Leahy Scale: Good Sitting balance - Comments: sitting EOB   Standing balance support: Bilateral upper extremity supported, During functional activity, Reliant on assistive device for balance Standing balance-Leahy Scale: Poor Standing balance comment: [email or web address] [email or web address]                            Communication Communication Communication: No apparent difficulties Factors Affecting Communication: Reduced clarity of speech  Cognition Arousal: Alert Behavior During Therapy: [facility] for tasks  assessed/performed   PT - Cognitive impairments: No apparent impairments                         Following commands: Intact      Cueing Cueing Techniques: Verbal cues  Exercises      General Comments         Pertinent Vitals/Pain Pain Assessment Pain Assessment: Faces Faces Pain Scale: Hurts little more Pain Location: R LE Pain Descriptors / Indicators: Discomfort, Grimacing Pain Intervention(s): Limited activity within patient's tolerance, Monitored during session    Home Living                          Prior Function            PT Goals (current goals can now be found in the care plan section) Acute Rehab PT Goals Patient Stated Goal: regain IND PT Goal Formulation: With patient Time For Goal Achievement: 08/29/24 Potential to Achieve Goals: Fair Progress towards PT goals: Progressing toward goals    Frequency    Min 2X/week      PT Plan      Co-evaluation PT/OT/SLP Co-Evaluation/Treatment: Yes Reason for Co-Treatment: For patient/therapist safety;To address functional/ADL transfers PT goals addressed during session: Mobility/safety with mobility;Balance;Proper use of DME OT goals addressed during session: ADL's and self-care      AM-PAC PT 6 Clicks Mobility   Outcome Measure  Help needed turning from your back to your side while in a flat bed without using bedrails?: A Lot Help needed moving from lying on your back to sitting on the side of a flat bed without using bedrails?: A Lot Help needed moving to and from a bed to a chair (including a wheelchair)?: A Lot Help needed standing up from a chair using your arms (e.g., wheelchair or bedside chair)?: A Lot Help needed to walk in hospital room?: A Lot Help needed climbing 3-5 steps with a railing? : Total 6 Click Score: 11    End of Session Equipment Utilized During Treatment: Gait belt Activity Tolerance: Patient limited by fatigue;Other (comment) (need for [facility]) Patient left: with call bell/phone within reach;Other (comment) [facility]) Nurse Communication: Mobility status PT Visit Diagnosis: Other abnormalities of gait and mobility (R26.89);Muscle weakness (generalized) (M62.81);Pain Pain -  Right/Left: Right Pain - part of body: Ankle and joints of foot     Time: 1230-1257 PT Time Calculation (min) (ACUTE ONLY): 27 min  Charges:    $Gait Training: 8-22 mins PT General Charges $$ ACUTE PT VISIT: 1 Visit                     [facility] PT Acute Rehabilitation Services Office [phone]    Javin Nong 08/20/2024, 1:56 PM

## 2024-08-20 NOTE — Progress Notes (Signed)
 Physical Therapy Treatment Patient Details Name: Christopher Riley MRN: [id number] DOB: July 30, 1950 Today's Date: 08/20/2024   History of Present Illness 74 y.o. male with medical history significant for heart failure with reduced EF not recovered, DM2, hypertension, OSA, morbid obesity and stasis dermatitis with sacral decubitus ulcer being admitted to the hospital with leg pain due to right lower extremity cellulitis.  Patient was just discharged from the hospital on 1/7 after a stay for acute left-sided heart failure, diuresed successfully and discharged back home.    PT Comments  Pt continues cooperative but progressing slowly with mobility.  Pt assisted up from [facility] to ambulate slightly increased distance in hall before sitting in chair.  Pt notably unsteady and at high risk of falling and fatigues easily with activity.  Patient will benefit from continued inpatient follow up therapy, <3 hours/day to maximize IND and safety prior to return home with limited assist.     If plan is discharge home, recommend the following: A lot of help with walking and/or transfers;A lot of help with bathing/dressing/bathroom;Assistance with cooking/housework;Assist for transportation;Help with stairs or ramp for entrance   Can travel by private vehicle     No  Equipment Recommendations  None recommended by PT    Recommendations for Other Services       Precautions / Restrictions Precautions Precautions: Fall Recall of Precautions/Restrictions: Intact Precaution/Restrictions Comments: pt denies falls in the past 6 months Restrictions Weight Bearing Restrictions Per Provider Order: No     Mobility  Bed Mobility Overal bed mobility: Needs Assistance Bed Mobility: Supine to Sit     Supine to sit: Mod assist, HOB elevated, Used rails     General bed mobility comments: Pt up on BSC at session beginning and in recliner at session end    Transfers Overall transfer level: Needs  assistance Equipment used: Rollator (4 wheels) Transfers: Sit to/from Stand Sit to Stand: From elevated surface, Min assist, +2 safety/equipment, +2 physical assistance           General transfer comment: Steady assist, increased time, difficulty with erect posture    Ambulation/Gait Ambulation/Gait assistance: Min assist, +2 physical assistance, +2 safety/equipment Gait Distance (Feet): 21 Feet Assistive device: Rollator (4 wheels) Gait Pattern/deviations: Decreased step length - right, Decreased step length - left, Decreased stride length, Trunk flexed, Wide base of support Gait velocity: decreased     General Gait Details: short steps near step through gait pattern, decreased cadence, flat foot posture with decreased heel-toe pattern, wide BOS and trunk forward flexed onto rollator; distance limited by need for [facility]   Stairs             Wheelchair Mobility     Tilt Bed    Modified Rankin (Stroke Patients Only)       Balance Overall balance assessment: Needs assistance Sitting-balance support: No upper extremity supported, Feet supported Sitting balance-Leahy Scale: Good Sitting balance - Comments: sitting EOB   Standing balance support: Bilateral upper extremity supported, During functional activity, Reliant on assistive device for balance Standing balance-Leahy Scale: Poor Standing balance comment: reliant [email or web address]                            Communication Communication Communication: No apparent difficulties Factors Affecting Communication: Reduced clarity of speech  Cognition Arousal: Alert Behavior During Therapy: WFL for tasks assessed/performed   PT - Cognitive impairments: No apparent impairments  Following commands: Intact      Cueing Cueing Techniques: Verbal cues  Exercises      General Comments        Pertinent Vitals/Pain Pain Assessment Pain Assessment: Faces Faces Pain Scale: Hurts  little more Pain Location: R LE Pain Descriptors / Indicators: Discomfort, Grimacing Pain Intervention(s): Limited activity within patient's tolerance, Monitored during session    Home Living                          Prior Function            PT Goals (current goals can now be found in the care plan section) Acute Rehab PT Goals Patient Stated Goal: regain IND PT Goal Formulation: With patient Time For Goal Achievement: 08/29/24 Potential to Achieve Goals: Fair Progress towards PT goals: Progressing toward goals    Frequency    Min 2X/week      PT Plan      Co-evaluation PT/OT/SLP Co-Evaluation/Treatment: Yes Reason for Co-Treatment: For patient/therapist safety;To address functional/ADL transfers PT goals addressed during session: Mobility/safety with mobility;Balance;Proper use of DME OT goals addressed during session: ADL's and self-care      AM-PAC PT 6 Clicks Mobility   Outcome Measure  Help needed turning from your back to your side while in a flat bed without using bedrails?: A Lot Help needed moving from lying on your back to sitting on the side of a flat bed without using bedrails?: A Lot Help needed moving to and from a bed to a chair (including a wheelchair)?: A Lot Help needed standing up from a chair using your arms (e.g., wheelchair or bedside chair)?: A Lot Help needed to walk in hospital room?: A Little Help needed climbing 3-5 steps with a railing? : Total 6 Click Score: 12    End of Session Equipment Utilized During Treatment: Gait belt Activity Tolerance: Patient limited by fatigue Patient left: in chair;with call bell/phone within reach;with chair alarm set Nurse Communication: Mobility status PT Visit Diagnosis: Other abnormalities of gait and mobility (R26.89);Muscle weakness (generalized) (M62.81);Pain Pain - Right/Left: Right Pain - part of body: Ankle and joints of foot     Time: 8679-8658 PT Time Calculation (min)  (ACUTE ONLY): 21 min  Charges:    $Gait Training: 8-22 mins PT General Charges $$ ACUTE PT VISIT: 1 Visit                     [facility] PT Acute Rehabilitation Services Office [phone]    Skyelar Swigart 08/20/2024, 2:04 PM

## 2024-08-20 NOTE — Plan of Care (Signed)

## 2024-08-20 NOTE — NC FL2 (Signed)
 " [place]  MEDICAID FL2 LEVEL OF CARE FORM     IDENTIFICATION  Patient Name: Christopher Riley Birthdate: 12/04/1950 Sex: male Admission Date (Current Location): 08/13/2024  [facility] and [place] Number:  Producer, Television/film/video and Address:  [facility],  [address] [place], [place] [address]      Provider Number: [id number]  Attending Physician Name and Address:  Sherrill Alejandro Donovan, DO  Relative Name and Phone Number:  Ilah Raoul Benne, Emergency Contact  [phone] (Mobile)    Current Level of Care: Hospital Recommended Level of Care: Skilled Nursing Facility Prior Approval Number:    Date Approved/Denied:   PASRR Number: pending  Discharge Plan: SNF    Current Diagnoses: Patient Active Problem List   Diagnosis Date Noted   Depression with anxiety 08/14/2024   Chronic heart failure with preserved ejection fraction (HFpEF) (HCC)    GERD (gastroesophageal reflux disease)    Paroxysmal atrial fibrillation (HCC)    Sepsis due to cellulitis (HCC) 08/13/2024   Acute clinical systolic heart failure (HCC) 07/29/2024   Normocytic anemia 07/29/2024   Obesity, Class III, BMI 40-49.9 (morbid obesity) (HCC) 03/20/2021   Morbid obesity (HCC) 03/20/2021   Xerosis cutis 03/20/2021   Asymptomatic varicose veins 01/23/2021   Barrett's esophagus 01/23/2021   Bilateral primary osteoarthritis of knee 01/23/2021   Cataract 01/23/2021   Dependence on continuous positive airway pressure ventilation 01/23/2021   Difficulty in walking, not elsewhere classified 01/23/2021   Disorder of the skin and subcutaneous tissue, unspecified 01/23/2021   Elevated prostate specific antigen (PSA) 01/23/2021   Encounter for dental examination 01/23/2021   Enthesopathy of ankle and tarsus 01/23/2021   First degree hemorrhoids 01/23/2021   Gastritis and gastroduodenitis with hemorrhage 01/23/2021   Generalized osteoarthrosis, involving multiple sites 01/23/2021   Hemorrhage of anus and  rectum 01/23/2021   History of falling 01/23/2021   Hypersomnia with sleep apnea 01/23/2021   Lymphedema, not elsewhere classified 01/23/2021   Microscopic hematuria 01/23/2021   Muscle weakness (generalized) 01/23/2021   Other abnormalities of gait and mobility 01/23/2021   Other joint derangement not elsewhere classified of lower leg 01/23/2021   Other reduced mobility 01/23/2021   Pain in right knee 01/23/2021   Other chronic pain 01/23/2021   Polyp of colon 01/23/2021   Presbyopia 01/23/2021   Presence of right artificial knee joint 01/23/2021   Primary open-angle glaucoma, right eye, severe stage 01/23/2021   Problem related to unspecified psychosocial circumstances 01/23/2021   Type 2 diabetes mellitus with hyperglycemia (HCC) 01/23/2021   Type 2 diabetes mellitus (HCC) 01/23/2021   Urinary incontinence 01/23/2021   Calculus of kidney 01/23/2021   Plantar flexed metatarsal bone of left foot 01/23/2021   Intertrigo 08/03/2016   History of nephrolithiasis 02/18/2016   Scrotal swelling 06/27/2013   Chest pain 09/27/2012   Fatigue 08/15/2012   Neuropathy 03/22/2011   WEAKNESS 06/30/2010   ANKLE PAIN, RIGHT 04/02/2010   CONSTIPATION 08/28/2009   ANEMIA, SECONDARY TO CHRONIC BLOOD LOSS 08/21/2009   HEMORRHOIDS, WITH BLEEDING 07/28/2009   NECK PAIN 02/19/2009   Hypertension 01/16/2009   SOMNOLENCE 01/16/2009   OSA (obstructive sleep apnea) 01/16/2009   SHOULDER PAIN, RIGHT 07/12/2008   Dyslipidemia 03/22/2008   LOW BACK PAIN, CHRONIC 03/22/2008   ADENOCARCINOMA, PROSTATE 03/12/2008   OTHER ACUTE REACTIONS TO STRESS 03/12/2008   MEMORY LOSS 03/12/2008   PARESTHESIA 03/12/2008   History of prostate cancer 03/12/2008    Orientation RESPIRATION BLADDER Height & Weight     Self,  Time, Situation, Place  Normal (regular-carb modified) Continent, External catheter Weight: (!) 170.3 kg Height:  6' 2 (188 cm)  BEHAVIORAL SYMPTOMS/MOOD NEUROLOGICAL BOWEL NUTRITION STATUS       Continent    AMBULATORY STATUS COMMUNICATION OF NEEDS Skin   Limited Assist Verbally Other (Comment) (Stage 3 pressure injury left buttocks w/foam lift dressing; erythema bilateral buttocks, groin)                       Personal Care Assistance Level of Assistance  Bathing, Feeding, Dressing Bathing Assistance: Limited assistance Feeding assistance: Limited assistance Dressing Assistance: Limited assistance     Functional Limitations Info  Sight, Hearing, Speech Sight Info: Adequate Hearing Info: Adequate Speech Info: Adequate    SPECIAL CARE FACTORS FREQUENCY  PT (By licensed PT), OT (By licensed OT)     PT Frequency: 5x/wk OT Frequency: 5x/wk            Contractures Contractures Info: Not present    Additional Factors Info  Code Status, Allergies, Psychotropic Code Status Info: Full code Allergies Info: Nsaids, Other Psychotropic Info: Seroquel  400mg  qhs         Current Medications (08/20/2024):  This is the current hospital active medication list Current Facility-Administered Medications  Medication Dose Route Frequency Provider Last Rate Last Admin   acetaminophen  (TYLENOL ) tablet 650 mg  650 mg Oral Q6H PRN Zella, Mir M, MD   650 mg at 08/16/24 2325   Or   acetaminophen  (TYLENOL ) suppository 650 mg  650 mg Rectal Q6H PRN Zella, Mir M, MD       albuterol  (PROVENTIL ) (2.5 MG/3ML) 0.083% nebulizer solution 2.5 mg  2.5 mg Nebulization Q2H PRN Zella, Mir M, MD   2.5 mg at 08/19/24 0028   bisacodyl  (DULCOLAX) suppository 10 mg  10 mg Rectal Daily PRN Sheikh, Omair Latif, DO       carvedilol  (COREG ) tablet 3.125 mg  3.125 mg Oral BID WC Zella, Mir M, MD   3.125 mg at 08/20/24 9180   ceFAZolin  (ANCEF ) IVPB 2g/100 mL premix  2 g Intravenous Q8H Sheikh, Omair Latif, DO 200 mL/hr at 08/20/24 1500 2 g at 08/20/24 1500   clonazePAM  (KLONOPIN ) tablet 2 mg  2 mg Oral BID PRN Zella Katha HERO, MD   2 mg at 08/16/24 2201   collagenase  (SANTYL ) ointment    Topical Daily Sheikh, Omair Latif, [place]   1 Application at 08/20/24 9040   empagliflozin  (JARDIANCE ) tablet 10 mg  10 mg Oral Daily Ikramullah, Mir M, MD   10 mg at 08/20/24 1001   enoxaparin  (LOVENOX ) injection 80 mg  80 mg Subcutaneous Q24H Zella, Mir M, MD   80 mg at 08/20/24 1458   guaiFENesin  (MUCINEX ) 12 hr tablet 1,200 mg  1,200 mg Oral BID Sheikh, Omair Latif, DO   1,200 mg at 08/20/24 1000   guaiFENesin -dextromethorphan  (ROBITUSSIN DM) 100-10 MG/5ML syrup 5 mL  5 mL Oral Q4H PRN Sheikh, Omair Latif, DO   5 mL at 08/19/24 0024   insulin  aspart (novoLOG ) injection 0-15 Units  0-15 Units Subcutaneous TID WC Zella Mir M, MD   2 Units at 08/20/24 1231   insulin  aspart (novoLOG ) injection 0-5 Units  0-5 Units Subcutaneous QHS Zella, Mir M, MD       ipratropium-albuterol  (DUONEB) 0.5-2.5 (3) MG/3ML nebulizer solution 3 mL  3 mL Nebulization BID Sheikh, Omair [place], DO   3 mL at 08/19/24 2118   latanoprost  (XALATAN ) 0.005 % ophthalmic solution 1  drop  1 drop Both Eyes QHS Zella, Mir M, MD   1 drop at 08/19/24 2134   linezolid  (ZYVOX ) IVPB 600 mg  600 mg Intravenous Q12H Sheikh, Omair Latif, [place] 300 mL/hr at 08/20/24 0955 600 mg at 08/20/24 9044   ondansetron  (ZOFRAN ) tablet 4 mg  4 mg Oral Q6H PRN Zella Katha HERO, MD       Or   ondansetron  (ZOFRAN ) injection 4 mg  4 mg Intravenous Q6H PRN Zella, Mir M, MD       oxyCODONE  (Oxy IR/ROXICODONE ) immediate release tablet 5 mg  5 mg Oral Q6H PRN Sheikh, Omair Latif, DO   5 mg at 08/20/24 1503   pantoprazole  (PROTONIX ) EC tablet 40 mg  40 mg Oral Daily Ikramullah, Mir M, MD   40 mg at 08/20/24 0959   polyethylene glycol (MIRALAX  / GLYCOLAX ) packet 17 g  17 g Oral BID Sheikh, Omair [place], DO   17 g at 08/20/24 9041   QUEtiapine  (SEROQUEL ) tablet 400 mg  400 mg Oral QHS Zella, Mir M, MD   400 mg at 08/19/24 2132   rosuvastatin  (CRESTOR ) tablet 20 mg  20 mg Oral QHS Zella, Mir M, MD   20 mg at 08/19/24 2132    senna-docusate (Senokot-S) tablet 1 tablet  1 tablet Oral BID Sheikh, Omair Latif, DO   1 tablet at 08/20/24 9040   spironolactone  (ALDACTONE ) tablet 25 mg  25 mg Oral Daily Ikramullah, Mir M, MD   25 mg at 08/20/24 1000   torsemide  (DEMADEX ) tablet 20 mg  20 mg Oral Daily Ikramullah, Mir M, MD   20 mg at 08/20/24 1000   Facility-Administered Medications Ordered in Other Encounters  Medication Dose Route Frequency Provider Last Rate Last Admin   chlorhexidine  (HIBICLENS ) 4 % liquid   Topical UD Aleene Kitchens, MD         Discharge Medications: Please see discharge summary for a list of discharge medications.  Relevant Imaging Results:  Relevant Lab Results:   Additional Information SSN: [id number]  [name], RN     "

## 2024-08-21 ENCOUNTER — Other Ambulatory Visit (HOSPITAL_COMMUNITY): Payer: Self-pay

## 2024-08-21 LAB — GLUCOSE, CAPILLARY
Glucose-Capillary: 88 mg/dL (ref 70–99)
Glucose-Capillary: 109 mg/dL — ABNORMAL HIGH (ref 70–99)
Glucose-Capillary: 113 mg/dL — ABNORMAL HIGH (ref 70–99)
Glucose-Capillary: 131 mg/dL — ABNORMAL HIGH (ref 70–99)

## 2024-08-21 NOTE — TOC Progression Note (Signed)
 Transition of Care [facility]) - Progression Note    Patient Details  Name: Christopher Riley MRN: [id number] Date of Birth: 04-07-1951  Transition of [facility]) CM/SW Contact  [name], RN Phone Number: 08/21/2024, 11:22 AM  Clinical Narrative:   No SNF bed offers, extended fax out for bed offers.    Expected Discharge Plan: Home w Home Health Services                 Expected Discharge Plan and Services       Living arrangements for the past 2 months: Single Family Home Expected Discharge Date: 08/20/24                                     Social Drivers of Health (SDOH) Interventions SDOH Screenings   Food Insecurity: No Food Insecurity (08/13/2024)  Housing: Low Risk (08/13/2024)  Transportation Needs: Unmet Transportation Needs (08/13/2024)  Utilities: Not At Risk (08/13/2024)  Social Connections: Socially Isolated (08/13/2024)  Tobacco Use: Low Risk (08/13/2024)    Readmission Risk Interventions     No data to display

## 2024-08-21 NOTE — Plan of Care (Signed)
  Problem: Metabolic: Goal: Ability to maintain appropriate glucose levels will improve Outcome: Progressing   Problem: Nutritional: Goal: Maintenance of adequate nutrition will improve Outcome: Progressing   Problem: Activity: Goal: Risk for activity intolerance will decrease Outcome: Progressing

## 2024-08-21 NOTE — Plan of Care (Signed)

## 2024-08-21 NOTE — Progress Notes (Signed)
" °   08/21/24 0000  BiPAP/CPAP/SIPAP  BiPAP/CPAP/SIPAP Pt Type Adult  BiPAP/CPAP/SIPAP DREAMSTATIOND  Mask Type Full face mask  Dentures removed? Not applicable  Mask Size Large  Respiratory Rate 18 breaths/min  Patient Home Machine No  Patient Home Mask No  Patient Home Tubing No  Auto Titrate Yes  Minimum cmH2O 5 cmH2O  Maximum cmH2O 20 cmH2O  CPAP/SIPAP surface wiped down Yes  Device Plugged into RED Power Outlet Yes    "

## 2024-08-21 NOTE — Progress Notes (Signed)
" °   08/21/24 2345  BiPAP/CPAP/SIPAP  BiPAP/CPAP/SIPAP Pt Type Adult  BiPAP/CPAP/SIPAP DREAMSTATIOND  Mask Type Full face mask  Dentures removed? Not applicable  Mask Size Large  Respiratory Rate 19 breaths/min  FiO2 (%) 21 %  Patient Home Machine No  Patient Home Mask No  Patient Home Tubing No  Auto Titrate Yes  Minimum cmH2O 5 cmH2O  Maximum cmH2O 20 cmH2O  CPAP/SIPAP surface wiped down Yes  Device Plugged into RED Power Outlet Yes    "

## 2024-08-21 NOTE — Progress Notes (Signed)
 " PROGRESS NOTE    Christopher Riley  FMW:[id number] DOB: Apr 01, 1951 DOA: 08/13/2024 PCP: Claude Reagin, NP   Brief Narrative:  Christopher Riley is a 74 y.o. male with medical history significant for heart failure with reduced EF not recovered, DM2, hypertension, OSA, morbid obesity and stasis dermatitis with sacral decubitus ulcer being admitted to the hospital with leg pain with wound and was found to have a right leg cellulitis.  Subsequently he also had acute on chronic CHF.  He was given diuresis and placed on antibiotics and improved significantly.  He continued be extremely weak and initially declined SNF but now agreeable but currently has no bed offers.  Assessment and Plan:  Sepsis due to Cellulitis [facility]): Improving. Patient met sepsis criteria with fever, leukocytosis and tachycardia.  Likely secondary to right lower extremity cellulitis as evident on imaging.  Preliminary blood cultures negative. Continued with IV ceftriaxone  and vancomycin  but will change to Cefazolin  and Linezolid ; Leg has improved and not as erythematous; Will complete 10 days of Tx -Will need to be OOB and Elevate Extremity  -WBC resolved and last check was 9.4 -Continue with supportive care -PT/OT evaluation ordered and recommending SNF and he was refusing at this time however it subsequently changed his mind after reassessment today.  Willing to go to SNF for rehabilitation -Right lower extremity venous Doppler ordered and showed no DVT -He is Medically Stable for Discharge.   Acute on Chronic Heart Failure with preserved ejection fraction (HFpEF) (HCC) Recent echo with normal EF and indeterminate diastolic function.  Recent admission secondary to CHF Exacerbation. -Avoid excessive IV fluid -Pro-BNP is now 73.9 -Continue home Coreg , Crestor , Aldactone  and torsemide  and Jardiance . Strict I's and O's and daily Weights.  Stop the IV furosemide  that he was getting and discontinuing the p.o. torsemide  10 mg daily.   He received IV Furosemide  40 mg twice daily x 4 days and now appears to be very euvolemic so holding further IV Diuresis.Intake/Output Summary (Last 24 hours) at 08/21/2024 1421 Last data filed at 08/21/2024 1326 Gross per 24 hour  Intake 2040 ml  Output 3350 ml  Net -1310 ml  -CXR as below  Dyspnea and Wheezing: Improving respiratory Status. Repeat CXR 1/25 showed Enlargement of the cardiopericardial silhouette with pulmonary vascular congestion. -Add DuoNebs scheduled q6h, Flutter Valve, Incentive Spirometry, and Guaifenesin  1200 mg po BID; C/w Albuterol  Nebs q2hprn -C/w DreamStation qHS -Repeat CXR intermittently   Constipation: She has bowel regimen with Senna-Docusate 1 tab p.o. twice daily, MiraLAX  17 g p.o. twice daily, and Bisacodyl  10 mg RC Dailyprn  Hyperkalemia: K+ is now improved and 4.7 on last check. May need to Hold Spironolactone  if persists. CTM & Trend & repeat CMP intermittently    Essential Hypertension: Blood pressure currently within goal. -Continuing home Coreg , Aldactone  and torsemide . CTM BP per Protocol. Last BP reading was 128/75   Paroxysmal Atrial Fibrillation [facility]): Per documentation patient has declined DOAC in the past.  Also has history of GI bleed. Continue with Carvedilol  3.125 mg po BID. Not on Telemetry   Type 2 Diabetes Mellitus (HCC): Seems well-controlled with CBG within goal and most recent A1c of 6.6 this hospitalization. Continue with SSI. CTM CBGs per protocol. CBG Trend ranging from 98-129   Renal Insufficiency: Fluctuating BUN/Cr Trend now stable and last check was 22/1.15. Avoid Nephrotoxic Medications, Contrast Dyes, Hypotension and Dehydration to Ensure Adequate Renal Perfusion and will need to Renally Adjust Meds. CTM & Trend Renal Fxn carefully & Repeat CMP  intermittently   Dyslipidemia: Continue with home Rosuvastatin  20 mg po qHS   Depression with Anxiety:  Continue home Queitapine 400 mg po qHS and 2 mg po Clonazepam  BIDprn    Normocytic Anemia: Hgb/Hct Trend fairly stable and last check was 12.6/42.1 w/ MCV was 85.9. Checked Anemia Panel and iron levels 50, UIBC 234, TIBC 284, saturation which was 18%, ferritin of 319, folate levels 11.4 and vitamin B12 was 3228. CTM & Trend and Repeat CBC intermittently if still here  GERD (Gastroesophageal Reflux disease)/GI Prophylaxis: Continue PPI w/ Pantoprazole  40 mg po Daily    OSA (obstructive sleep apnea):  CPAP at night  Hypoalbuminemia: Patient's Albumin Lvl went from 3.9 -> 3.5 -> 3.4 -> 3.3 x2. CTM & Trend & Repeat CMP in the AM  Pressure Ulcer:  Wound 08/15/24 Pressure Injury Buttocks Left Stage 3 -  Full thickness tissue loss. Subcutaneous fat may be visible but bone, tendon or muscle are NOT exposed. (Active)    Class III (Morbid) Obesity: Complicates overall prognosis and care. Estimated body mass index is 48.2 kg/m as calculated from the following:   Height as of this encounter: 6' 2 (1.88 m).   Weight as of this encounter: 170.3 kg. Weight Loss and Dietary Counseling given   DVT prophylaxis: Enoxaparin  80 mg sq daily     Code Status: Full Code Family Communication: No family present @ bedside   Disposition Plan:  Level of care: Med-Surg Status is: Inpatient Remains inpatient appropriate because: Medically stable for D/C but awaiting SNF   Consultants:  None  Procedures:  As delineated as above  Antimicrobials:  Anti-infectives (From admission, onward)    Start     Dose/Rate Route Frequency Ordered Stop   08/20/24 0000  linezolid  (ZYVOX ) 600 MG tablet        600 mg Oral 2 times daily 08/20/24 1226 08/23/24 2359   08/20/24 0000  cefadroxil  (DURICEF) 500 MG capsule        1,000 mg Oral 2 times daily 08/20/24 1226 08/23/24 2359   08/15/24 2200  linezolid  (ZYVOX ) IVPB 600 mg        600 mg 300 mL/hr over 60 Minutes Intravenous Every 12 hours 08/15/24 2024 08/22/24 2359   08/15/24 2200  ceFAZolin  (ANCEF ) IVPB 2g/100 mL premix        2 g 200  mL/hr over 30 Minutes Intravenous Every 8 hours 08/15/24 2029 08/22/24 2359   08/15/24 1000  vancomycin  (VANCOREADY) IVPB 2000 mg/400 mL  Status:  Discontinued        2,000 mg 200 mL/hr over 120 Minutes Intravenous Every 24 hours 08/15/24 0828 08/15/24 2024   08/14/24 1000  vancomycin  (VANCOCIN ) 2,500 mg in sodium chloride  0.9 % 500 mL IVPB  Status:  Discontinued        2,500 mg 262.5 mL/hr over 120 Minutes Intravenous Every 24 hours 08/13/24 0826 08/15/24 0828   08/13/24 0815  cefTRIAXone  (ROCEPHIN ) 2 g in sodium chloride  0.9 % 100 mL IVPB  Status:  Discontinued        2 g 200 mL/hr over 30 Minutes Intravenous Every 24 hours 08/13/24 0808 08/15/24 2024   08/13/24 0800  vancomycin  (VANCOCIN ) 2,500 mg in sodium chloride  0.9 % 500 mL IVPB        2,500 mg 262.5 mL/hr over 120 Minutes Intravenous  Once 08/13/24 0748 08/13/24 1044   08/13/24 0545  cefTRIAXone  (ROCEPHIN ) 2 g in sodium chloride  0.9 % 100 mL IVPB  2 g 200 mL/hr over 30 Minutes Intravenous  Once 08/13/24 0542 08/13/24 9376       Subjective: Seen and examined at bedside is resting and denies any shortness of breath.  Thinks his leg is better.  Not as painful but still little sore.  No nausea or vomiting.  No other concerns or complaints at this time.  Remains very weak  Objective: Vitals:   08/20/24 2143 08/21/24 0604 08/21/24 0821 08/21/24 1425  BP: (!) 160/70 128/75  120/73  Pulse: 87 80  90  Resp: 19 17  15   Temp: 98 F (36.7 C) 97.8 F (36.6 C)  98 F (36.7 C)  TempSrc:  Oral    SpO2: 93% 95% 92% 90%  Weight:      Height:        Intake/Output Summary (Last 24 hours) at 08/21/2024 1436 Last data filed at 08/21/2024 1326 Gross per 24 hour  Intake 2040 ml  Output 3350 ml  Net -1310 ml   Filed Weights   08/15/24 0732  Weight: (!) 170.3 kg   Examination: Physical Exam:  Constitutional: WN/WD morbidly obese chronically ill-appearing African-American male in no acute distress and has resting Respiratory:  Diminished to auscultation bilaterally with some coarse breath sounds, no wheezing, rales, rhonchi or crackles. Normal respiratory effort and patient is not tachypenic. No accessory muscle use.  Unlabored breathing Cardiovascular: RRR, no murmurs / rubs / gallops. S1 and S2 auscultated.  Mild 1+ extremity edema worse on the right compared to left Abdomen: Soft, non-tender, distended secondary body habitus. Bowel sounds positive.  GU: Deferred. Musculoskeletal: No clubbing / cyanosis of digits/nails. No joint deformity upper and lower extremities.  Skin: No rashes, lesions, ulcers on limited skin evaluation. No induration; Warm and dry.  Neurologic: CN 2-12 grossly intact with no focal deficits.  Romberg sign and cerebellar reflexes not assessed.  Psychiatric: Normal judgment and insight. Alert and oriented x 3. Normal mood and appropriate affect.   Data Reviewed: I have personally reviewed following labs and imaging studies  CBC: Recent Labs  Lab 08/15/24 2102 08/17/24 0506 08/18/24 0438 08/19/24 0453 08/20/24 0413  WBC 13.7* 9.5 9.1 9.4 9.4  NEUTROABS 11.0* 6.9 6.4 5.9 6.5  HGB 12.1* 13.0 11.7* 12.2* 12.6*  HCT 39.6 40.3 38.9* 40.2 42.1  MCV 85.5 83.4 85.5 86.3 85.9  PLT 259 262 255 270 285   Basic Metabolic Panel: Recent Labs  Lab 08/15/24 2102 08/17/24 0506 08/18/24 0438 08/19/24 0453 08/20/24 0413  NA 139 138 139 139 138  K 5.2* 4.4 4.6 5.1 4.7  CL 100 97* 99 99 98  CO2 29 29 31  34* 32  GLUCOSE 102* 109* 107* 96 102*  BUN 19 18 18 18 22   CREATININE 1.29* 1.23 1.09 1.17 1.15  CALCIUM  8.9 8.9 8.9 9.1 9.2  MG 2.2 2.4 2.6* 2.7* 2.9*  PHOS 3.6 3.4 3.3 3.6 3.4   GFR: Estimated Creatinine Clearance: 92.2 mL/min (by C-G formula based on SCr of 1.15 mg/dL). Liver Function Tests: Recent Labs  Lab 08/15/24 2102 08/17/24 0506 08/18/24 0438 08/19/24 0453 08/20/24 0413  AST 20 25 61* 42* 35  ALT 17 15 20 18 17   ALKPHOS 70 71 75 65 69  BILITOT 0.3 0.3 0.3 0.3 0.3  PROT  7.3 7.5 7.3 7.1 7.6  ALBUMIN 3.5 3.4* 3.3* 3.3* 3.4*   No results for input(s): LIPASE, AMYLASE in the last 168 hours. No results for input(s): AMMONIA in the last 168 hours. Coagulation Profile: No results  for input(s): INR, PROTIME in the last 168 hours. Cardiac Enzymes: No results for input(s): CKTOTAL, CKMB, CKMBINDEX, TROPONINI in the last 168 hours. BNP (last 3 results) Recent Labs    07/21/24 0039 07/28/24 1525 08/19/24 0453  PROBNP 351.0* 180.0 73.9   HbA1C: No results for input(s): HGBA1C in the last 72 hours. CBG: Recent Labs  Lab 08/20/24 1132 08/20/24 1722 08/20/24 2141 08/21/24 0733 08/21/24 1205  GLUCAP 122* 102* 119* 88 109*   Lipid Profile: No results for input(s): CHOL, HDL, LDLCALC, TRIG, CHOLHDL, LDLDIRECT in the last 72 hours. Thyroid Function Tests: No results for input(s): TSH, T4TOTAL, FREET4, T3FREE, THYROIDAB in the last 72 hours. Anemia Panel: Recent Labs    08/20/24 0413  VITAMINB12 3,228*  FOLATE 11.4  FERRITIN 319  TIBC 284  IRON 50  RETICCTPCT 1.1   Sepsis Labs: No results for input(s): PROCALCITON, LATICACIDVEN in the last 168 hours.  Recent Results (from the past 240 hours)  Culture, blood (Routine X 2) w Reflex to ID Panel     Status: None   Collection Time: 08/13/24  5:26 AM   Specimen: BLOOD  Result Value Ref Range Status   Specimen Description   Final    BLOOD LEFT ANTECUBITAL Performed at [facility], [address], [place], [place] [address]    Special Requests   Final    BOTTLES DRAWN AEROBIC AND ANAEROBIC Blood Culture results may not be optimal due to an inadequate volume of blood received in culture bottles Performed at [facility], [address] [place], [place] [address]    Culture   Final    NO GROWTH 5 DAYS Performed at [facility], [address], [place], [place] [address]    Report Status 08/18/2024 FINAL   Final  Culture, blood (Routine X 2) w Reflex to ID Panel     Status: None   Collection Time: 08/13/24  5:37 AM   Specimen: BLOOD  Result Value Ref Range Status   Specimen Description   Final    BLOOD RIGHT ANTECUBITAL Performed at [facility], [address] [place], [place] [address]    Special Requests   Final    BOTTLES DRAWN AEROBIC ONLY Blood Culture results may not be optimal due to an inadequate volume of blood received in culture bottles Performed at [facility], [address], [place], [place] [address]    Culture   Final    NO GROWTH 5 DAYS Performed at [facility], [address], [place], [place] [address]    Report Status 08/18/2024 FINAL  Final  Resp panel by RT-PCR (RSV, Flu A&B, Covid) Anterior Nasal Swab     Status: None   Collection Time: 08/13/24 10:49 PM   Specimen: Anterior Nasal Swab  Result Value Ref Range Status   SARS Coronavirus 2 by RT PCR NEGATIVE NEGATIVE Final    Comment: (NOTE) SARS-CoV-2 target nucleic acids are NOT DETECTED.  The SARS-CoV-2 RNA is generally detectable in upper respiratory specimens during the acute phase of infection. The lowest concentration of SARS-CoV-2 viral copies this assay can detect is 138 copies/mL. A negative result does not preclude SARS-Cov-2 infection and should not be used as the sole basis for treatment or other patient management decisions. A negative result may occur with  improper specimen collection/handling, submission of specimen other than nasopharyngeal swab, presence of viral mutation(s) within the areas targeted by this assay, and inadequate number of  viral copies(<138 copies/mL). A negative result must be combined with clinical observations, patient history, and epidemiological information. The expected result is Negative.  Fact Sheet for Patients:  bloggercourse.com  Fact Sheet for Healthcare Providers:   seriousbroker.it  This test is no t yet approved or cleared by the [place]  FDA and  has been authorized for detection and/or diagnosis of SARS-CoV-2 by FDA under an Emergency Use Authorization (EUA). This EUA will remain  in effect (meaning this test can be used) for the duration of the COVID-19 declaration under Section 564(b)(1) of the Act, 21 U.S.C.section 360bbb-3(b)(1), unless the authorization is terminated  or revoked sooner.       Influenza A by PCR NEGATIVE NEGATIVE Final   Influenza B by PCR NEGATIVE NEGATIVE Final    Comment: (NOTE) The Xpert Xpress SARS-CoV-2/FLU/RSV plus assay is intended as an aid in the diagnosis of influenza from Nasopharyngeal swab specimens and should not be used as a sole basis for treatment. Nasal washings and aspirates are unacceptable for Xpert Xpress SARS-CoV-2/FLU/RSV testing.  Fact Sheet for Patients: bloggercourse.com  Fact Sheet for Healthcare Providers: seriousbroker.it  This test is not yet approved or cleared by the [place]  FDA and has been authorized for detection and/or diagnosis of SARS-CoV-2 by FDA under an Emergency Use Authorization (EUA). This EUA will remain in effect (meaning this test can be used) for the duration of the COVID-19 declaration under Section 564(b)(1) of the Act, 21 U.S.C. section 360bbb-3(b)(1), unless the authorization is terminated or revoked.     Resp Syncytial Virus by PCR NEGATIVE NEGATIVE Final    Comment: (NOTE) Fact Sheet for Patients: bloggercourse.com  Fact Sheet for Healthcare Providers: seriousbroker.it  This test is not yet approved or cleared by the [place]  FDA and has been authorized for detection and/or diagnosis of SARS-CoV-2 by FDA under an Emergency Use Authorization (EUA). This EUA will remain in effect (meaning this test can be used) for  the duration of the COVID-19 declaration under Section 564(b)(1) of the Act, 21 U.S.C. section 360bbb-3(b)(1), unless the authorization is terminated or revoked.  Performed at [facility], [address], [place], [place] [address]     Radiology Studies: No results found.  Scheduled Meds:  carvedilol   3.125 mg Oral BID WC   collagenase    Topical Daily   empagliflozin   10 mg Oral Daily   enoxaparin  (LOVENOX ) injection  80 mg Subcutaneous Q24H   guaiFENesin   1,200 mg Oral BID   insulin  aspart  0-15 Units Subcutaneous TID WC   insulin  aspart  0-5 Units Subcutaneous QHS   ipratropium-albuterol   3 mL Nebulization BID   latanoprost   1 drop Both Eyes QHS   pantoprazole   40 mg Oral Daily   polyethylene glycol  17 g Oral BID   QUEtiapine   400 mg Oral QHS   rosuvastatin   20 mg Oral QHS   senna-docusate  1 tablet Oral BID   spironolactone   25 mg Oral Daily   torsemide   20 mg Oral Daily   Continuous Infusions:   ceFAZolin  (ANCEF ) IV 2 g (08/21/24 1337)   linezolid  (ZYVOX ) IV 600 mg (08/21/24 0916)    LOS: 8 days   Alejandro Marker, DO [facility] Available via Epic secure chat 7am-7pm After these hours, please refer to coverage provider listed on [email or web address] 08/21/2024, 2:36 PM  "

## 2024-08-22 LAB — GLUCOSE, CAPILLARY
Glucose-Capillary: 94 mg/dL (ref 70–99)
Glucose-Capillary: 116 mg/dL — ABNORMAL HIGH (ref 70–99)
Glucose-Capillary: 99 mg/dL (ref 70–99)
Glucose-Capillary: 106 mg/dL — ABNORMAL HIGH (ref 70–99)

## 2024-08-22 MED ORDER — CEFADROXIL 500 MG PO CAPS
1000.0000 mg | ORAL_CAPSULE | Freq: Two times a day (BID) | ORAL | Status: DC
  Administered 2024-08-22 – 2024-08-23 (×2): 1000 mg via ORAL
  Filled 2024-08-22 (×2): qty 2

## 2024-08-22 MED ORDER — TORSEMIDE 20 MG PO TABS
40.0000 mg | ORAL_TABLET | Freq: Every day | ORAL | Status: DC
  Administered 2024-08-23: 40 mg via ORAL
  Filled 2024-08-22: qty 2

## 2024-08-22 MED ORDER — KETOROLAC TROMETHAMINE 15 MG/ML IJ SOLN
15.0000 mg | Freq: Once | INTRAMUSCULAR | Status: AC
  Administered 2024-08-22: 15 mg via INTRAVENOUS
  Filled 2024-08-22: qty 1

## 2024-08-22 MED ORDER — METHOCARBAMOL 1000 MG/10ML IJ SOLN
500.0000 mg | Freq: Once | INTRAMUSCULAR | Status: AC
  Administered 2024-08-22: 500 mg via INTRAVENOUS
  Filled 2024-08-22: qty 10

## 2024-08-22 NOTE — Plan of Care (Signed)

## 2024-08-22 NOTE — Care Management Important Message (Signed)
 Important Message  Patient Details IM Letter given. Name: Christopher Riley MRN: [id number] Date of Birth: 10/27/1950   Important Message Given:  Yes - Medicare IM     Luara Faye 08/22/2024, 9:27 AM

## 2024-08-22 NOTE — Progress Notes (Signed)
 TR[facility]  RAHSAAN WEAKLAND FMW:[id number]  DOB: [birth year]  DOA: 08/13/2024  PCP: Claude Reagin, NP  08/22/2024,7:49 AM  LOS: 9 days    Code Status: Full     from: Home  73 DM TY 2 HTN OSA on CPAP class III obesity BMI 40 Underlying HFpEF-last echo very limited poor sound transmission 55-60% concentric LVH Recent hospitalization 1/3-08/01/2024 HFpEF exacerbation diuresed with Lasix  after being net -5 L 1/19 presented back to [facility] increasing pain slight swelling without discharge right lower extremity below calf--WBC 12.1 hemoglobin 11.9 platelet 234 BUN/creatinine 16/1.0 potassium 4.5 CRP 8 CT angio bifemoral = no occlusion or hemodynamically significant stenosis generalized edema right greater than sign left extremity?  Cellulitis bilaterally enlarged inguinal chain loads largest on the right shortest on the left moderate-sized hiatal hernia   Assessment  & Plan :    Sepsis on admission secondary to cellulitis Lower extremity cellulitis bilaterally is improved-I do not see any wound on his lower extremity Initially on cefazolin  and vancomycin  linezolid -currently continue cefazolin  and linezolid -Will narrow to cefadroxil  1 g 3 times daily Offload as best able-Will need skilled placement after finally agreeing to the same after discussion with him about the same over several days He is medically stable for discharge  Acute superimposed on HFpEF as evidenced by dyspnea earlier in hospital stay with x-ray showing pulmonary vascular congestion Tenuous Aldactone  25--continues Coreg  3.125 twice daily, Jardiance  10 Home medication was torsemide  60 so I will increase the dose from 20 to 40 mg daily  AKI on admission superimposed on CKD 2 Hyperkalemia on admission Probably secondary to Aldactone  use in the setting of volume depletion Cautious Aldactone -hyperkalemia is resolved to a degree-monitor trends  Paroxysmal A-fib CHADVASC >4 declining anticoagulation in the past  Class III obesity DM TY  2  OSA on CPAP  Pressure ulcer stage III buttocks Wound on left bottom reviewed on 1/28 does not seem erythematous or purulent-seems chronic in nature and states has been there for a while Antibiotic should cover No oozing-follow-up as an outpatient offload as best able Dress with Santyl  topically daily  Paroxysmal A-fib CHADVASC >4 declining DOAC previously-?  GI bleed in the past Continuing Coreg  3.125 twice daily  DM TY 2 At home only on Jardiance  12.5-here have resumed at 10 mg daily CBGs ranging 94-1 31 so probably will not require sliding scale coverage  Hyperlipidemia Continue Crestor  20  Depression with anxiety Continue Seroquel  400-continue Klonopin  2 mg twice daily as needed anxiety  Data Reviewed today: No labs today DVT prophylaxis: Lovenox   Dispo/Global plan: Awaiting skilled placement     Subjective:   Awake coherent in some mild discomfort in left buttock no fever no nausea no vomiting  Objective + exam Vitals:   08/21/24 1425 08/21/24 2156 08/21/24 2201 08/22/24 0605  BP: 120/73 (!) 142/83  119/67  Pulse: 90 83  75  Resp: 15 17  16   Temp: 98 F (36.7 C) 97.9 F (36.6 C)  97.6 F (36.4 C)  TempSrc:  Oral  Oral  SpO2: 90% 94% 95% 96%  Weight:      Height:       Filed Weights   08/15/24 0732  Weight: (!) 170.3 kg     Examination: EOMI NCAT no focal deficit icterus no pallor-chest is clear no wheeze--S1-S2 no murmur no rub no gallop power 5/5 Abdomen obese nontender Left buttock seems to have chronic wound Both lower extremities have deep and cratered fissures secondary to diuresis ROM intact no  focal deficit Power 5/5   Scheduled Meds:  carvedilol   3.125 mg Oral BID WC   collagenase    Topical Daily   empagliflozin   10 mg Oral Daily   enoxaparin  (LOVENOX ) injection  80 mg Subcutaneous Q24H   guaiFENesin   1,200 mg Oral BID   insulin  aspart  0-15 Units Subcutaneous TID WC   insulin  aspart  0-5 Units Subcutaneous QHS    ipratropium-albuterol   3 mL Nebulization BID   latanoprost   1 drop Both Eyes QHS   pantoprazole   40 mg Oral Daily   polyethylene glycol  17 g Oral BID   QUEtiapine   400 mg Oral QHS   rosuvastatin   20 mg Oral QHS   senna-docusate  1 tablet Oral BID   spironolactone   25 mg Oral Daily   torsemide   20 mg Oral Daily   Continuous Infusions:   ceFAZolin  (ANCEF ) IV 2 g (08/22/24 0527)   linezolid  (ZYVOX ) IV 600 mg (08/21/24 2146)   acetaminophen  **OR** acetaminophen , albuterol , bisacodyl , clonazePAM , guaiFENesin -dextromethorphan , ondansetron  **OR** ondansetron  (ZOFRAN ) IV, oxyCODONE   I spent 45 minutes today before, during and after this patient interview and examination--reviewing pertinent data, coordinating the patient's care and in communication with the care team and other medical professionals  Colen Grimes, MD  [facility]

## 2024-08-22 NOTE — TOC Progression Note (Signed)
 Transition of Care [facility]) - Progression Note    Patient Details  Name: Christopher Riley MRN: [id number] Date of Birth: 07/18/1951  Transition of Care [facility]) CM/SW Contact  [name], RN Phone Number: 08/22/2024, 4:28 PM  Clinical Narrative:   Call received from [facility]  [facility] [facility], [phone], ext [phone], confirmed short term rehab/SNF approved for [facility] rehab, [address], Call report [phone], ext [phone]. [facility] with pick up between 9-10am tomorrow. CM met with patient at bedside, patient declined [facility] in [place], reports he would prefer to go [facility] with [facility]. Team notified. [facility] to [facility] to inform, no answer, vm left requesting call back to inquire if other SNF are available, await call back.     Expected Discharge Plan: Home w Home Health Services                 Expected Discharge Plan and Services       Living arrangements for the past 2 months: Single Family Home Expected Discharge Date: 08/20/24                                     Social Drivers of Health (SDOH) Interventions SDOH Screenings   Food Insecurity: No Food Insecurity (08/13/2024)  Housing: Low Risk (08/13/2024)  Transportation Needs: Unmet Transportation Needs (08/13/2024)  Utilities: Not At Risk (08/13/2024)  Social Connections: Socially Isolated (08/13/2024)  Tobacco Use: Low Risk (08/13/2024)    Readmission Risk Interventions     No data to display

## 2024-08-22 NOTE — Progress Notes (Signed)
 Physical Therapy Treatment Patient Details Name: Christopher Riley MRN: [id number] DOB: 09-24-1950 Today's Date: 08/22/2024   History of Present Illness 74 y.o. male with medical history significant for heart failure with reduced EF not recovered, DM2, hypertension, OSA, morbid obesity and stasis dermatitis with sacral decubitus ulcer being admitted to the hospital with leg pain due to right lower extremity cellulitis.  Patient was just discharged from the hospital on 1/7 after a stay for acute left-sided heart failure, diuresed successfully and discharged back home.    PT Comments  PT - Cognition Comments: AxO x 3 pleasant and motivated.  Retired [employer].  Lives home alone.  [place].  Drives. Assisted OOB to amb required increased time and effort.  General bed mobility comments: Pt stated he has a Hospital Bed at home.  Did require Max Assist for upper body due to BMI/ABD girth plus increased time to transition to seated EOB. General transfer comment: Slow and labored movement to power up into standing from elevated bed utilizing Rolator. General Gait Details: + 2 assist for safety such that recliner was following.  Pt was able to amb an increased distance using his personal Bariatric Rollator walker.  Gait is slow.  Distance limited by Mod c/o weakness/fatigue. Assisted back to bed for a dressing change.  LPT has rec Pt will need ST Rehab at SNF to address mobility and functional decline prior to safely returning home.    If plan is discharge home, recommend the following:     Can travel by private vehicle     No  Equipment Recommendations  None recommended by PT    Recommendations for Other Services       Precautions / Restrictions Precautions Precautions: Fall Precaution/Restrictions Comments: pt denies falls in the past 6 months Restrictions Weight Bearing Restrictions Per Provider Order: No     Mobility  Bed Mobility Overal bed mobility: Needs Assistance Bed Mobility: Supine to  Sit     Supine to sit: Max assist     General bed mobility comments: Pt stated he has a Hospital Bed at home.  Did require Max Assist for upper body due to BMI/ABD girth plus increased time to transition to seated EOB.    Transfers Overall transfer level: Needs assistance Equipment used: Rollator (4 wheels) Transfers: Sit to/from Stand Sit to Stand: From elevated surface, Min assist, +2 safety/equipment, +2 physical assistance           General transfer comment: Slow and labored movement to power up into standing from elevated bed utilizing Rolator.    Ambulation/Gait Ambulation/Gait assistance: Min assist, +2 physical assistance, +2 safety/equipment Gait Distance (Feet): 54 Feet Assistive device: Rollator (4 wheels) Gait Pattern/deviations: Decreased step length - right, Decreased step length - left, Decreased stride length, Trunk flexed, Wide base of support Gait velocity: decreased     General Gait Details: + 2 assist for safety such that recliner was following.  Pt was able to amb an increased distance using his personal Bariatric Rollator walker.  Gait is slow.  Distance limited by Mod c/o weakness/fatigue.   Stairs             Wheelchair Mobility     Tilt Bed    Modified Rankin (Stroke Patients Only)       Balance  Communication    Cognition Arousal: Alert Behavior During Therapy: WFL for tasks assessed/performed   PT - Cognitive impairments: No apparent impairments                       PT - Cognition Comments: AxO x 3 pleasant and motivated.  Retired Arts Development Officer.  Lives home alone.  IND.  Drives.        Cueing    Exercises      General Comments        Pertinent Vitals/Pain Pain Assessment Pain Assessment: Faces Faces Pain Scale: Hurts a little bit Pain Location: butt Pain Descriptors / Indicators: Discomfort, Grimacing Pain Intervention(s): Monitored during session,  Repositioned    Home Living                          Prior Function            PT Goals (current goals can now be found in the care plan section) Progress towards PT goals: Progressing toward goals    Frequency    Min 2X/week      PT Plan      Co-evaluation              AM-PAC PT 6 Clicks Mobility   Outcome Measure  Help needed turning from your back to your side while in a flat bed without using bedrails?: A Lot Help needed moving from lying on your back to sitting on the side of a flat bed without using bedrails?: A Lot Help needed moving to and from a bed to a chair (including a wheelchair)?: A Lot Help needed standing up from a chair using your arms (e.g., wheelchair or bedside chair)?: A Lot Help needed to walk in hospital room?: A Little Help needed climbing 3-5 steps with a railing? : A Lot 6 Click Score: 13    End of Session Equipment Utilized During Treatment: Gait belt Activity Tolerance: Patient limited by fatigue Patient left: with call bell/phone within reach;in bed Nurse Communication: Mobility status PT Visit Diagnosis: Other abnormalities of gait and mobility (R26.89);Muscle weakness (generalized) (M62.81);Pain Pain - Right/Left: Right     Time: 1040-1058 PT Time Calculation (min) (ACUTE ONLY): 18 min  Charges:    $Gait Training: 8-22 mins PT General Charges $$ ACUTE PT VISIT: 1 Visit                     Katheryn Leap  PTA Acute  Rehabilitation Services Office M-F          [phone]

## 2024-08-23 ENCOUNTER — Other Ambulatory Visit (HOSPITAL_COMMUNITY): Payer: Self-pay

## 2024-08-23 LAB — GLUCOSE, CAPILLARY: Glucose-Capillary: 84 mg/dL (ref 70–99)

## 2024-08-23 NOTE — Plan of Care (Signed)

## 2024-08-23 NOTE — Progress Notes (Signed)
 Discharge meds in a secure bag delivered to patient's RN by this RN.

## 2024-08-23 NOTE — Discharge Summary (Signed)
 Physician Discharge Summary  Christopher Riley FMW:[id number] DOB: September 15, 1950 DOA: 08/13/2024  PCP: Claude Reagin, NP  Admit date: 08/13/2024 Discharge date: 08/23/2024  Time spent: 42 minutes  Recommendations for Outpatient Follow-up:  Requires Chem-12 CBC INR in about 1 week Requires elevation of lower extremities as well as wrapping of them if needed and outpatient follow-up with primary care regarding fluid status as well as adjustment of diuretics Home health therapy has been requested for patient  Discharge Diagnoses:  MAIN problem for hospitalization   Right lower extremity cellulitis  Please see below for itemized issues addressed in HOpsital- refer to other progress notes for clarity if needed  Discharge Condition: Improved  Diet recommendation: Diabetic heart healthy  Filed Weights   08/15/24 0732  Weight: (!) 170.3 kg    History of present illness:  73 DM TY 2 HTN OSA on CPAP class III obesity BMI 40 Underlying HFpEF-last echo very limited poor sound transmission 55-60% concentric LVH Recent hospitalization 1/3-08/01/2024 HFpEF exacerbation diuresed with Lasix  after being net -5 L 1/19 presented back to [facility] increasing pain slight swelling without discharge right lower extremity below calf--WBC 12.1 hemoglobin 11.9 platelet 234 BUN/creatinine 16/1.0 potassium 4.5 CRP 8 CT angio bifemoral = no occlusion or hemodynamically significant stenosis generalized edema right greater than sign left extremity?  Cellulitis bilaterally enlarged inguinal chain loads largest on the right shortest on the left moderate-sized hiatal hernia    Assessment  & Plan :      Sepsis on admission secondary to cellulitis Lower extremity cellulitis bilaterally is improved-I do not see any wound on his lower extremity after examination and it looks like he is quite stable Initially on cefazolin  and vancomycin  linezolid -currently continue cefazolin  and linezolid -discharge was narrowed to  cefadroxil  1 g 3 times daily as well as linezolid  600 twice daily which will be completed on 08/25/2024 Declines skilled placement and is stable for discharge as he wants to go on his way and take care of affairs at home given his daughter is on hospice and will probably require attention   Acute superimposed on HFpEF as evidenced by dyspnea earlier in hospital stay with x-ray showing pulmonary vascular congestion Continued Aldactone  25--continues Coreg  3.125 twice daily, Jardiance  10 Home medication was torsemide  60 so I will increase the dose from 20 to 40 mg daily and at discharge I resumed his dose of 80 mg Medications should be adjusted in the outpatient setting based on fluid status as well as his labs etc.   AKI on admission superimposed on CKD 2 Hyperkalemia on admission Probably secondary to Aldactone  use in the setting of volume depletion Cautious Aldactone -hyperkalemia is resolved to a degree-monitor trends in the outpatient setting and ensure that he is not becoming hyperkalemic either   Paroxysmal A-fib CHADVASC >4 declining anticoagulation in the past Patient is already on Coreg  3.125 seems to be in sinus rhythm on exam   OSA on CPAP   Pressure ulcer stage III buttocks Wound on left bottom reviewed on 1/28 does not seem erythematous or purulent-seems chronic in nature and states has been there for a while Antibiotic should cover but there is no overt underlying infection and dressing changes are stable No oozing-follow-up as an outpatient offload as best able Dress with Santyl  topically daily   Paroxysmal A-fib CHADVASC >4 declining DOAC previously-?  GI bleed in the past Continuing Coreg  3.125 twice daily   Diabetes mellitus type 2 with super morbid obesity BMI >40 At home only on Jardiance  12.5-but  was also on Wegovy injections  CBGs are well-controlled between 80s and 110 115 Can continue his outpatient regimen   Hyperlipidemia Continue Crestor  20   Depression with  anxiety Continue Seroquel  at a lower dose of 200-continue Klonopin  2 mg twice daily as needed anxiety     Discharge Exam: Vitals:   08/22/24 1500 08/22/24 2055  BP: 116/73 128/63  Pulse: 79 74  Resp: 18 16  Temp: 97.8 F (36.6 C) 98 F (36.7 C)  SpO2: 95% 96%    Subj on day of d/c   Awake alert coherent no distress looks fair feels well no new changes  General Exam on discharge  CTAB no added sound no wheeze no rales no rhonchi ROM intact no focal deficit Abdomen soft no rebound no guarding No lower extremity edema he has puckered and dried out skin I did not examine the wound on his left buttocks today  Discharge Instructions   Discharge Instructions     (HEART FAILURE PATIENTS) Call MD:  Anytime you have any of the following symptoms: 1) 3 pound weight gain in 24 hours or 5 pounds in 1 week 2) shortness of breath, with or without a dry hacking cough 3) swelling in the hands, feet or stomach 4) if you have to sleep on extra pillows at night in order to breathe.   Complete by: As directed    Call MD for:  difficulty breathing, headache or visual disturbances   Complete by: As directed    Call MD for:  extreme fatigue   Complete by: As directed    Call MD for:  hives   Complete by: As directed    Call MD for:  persistant dizziness or light-headedness   Complete by: As directed    Call MD for:  persistant nausea and vomiting   Complete by: As directed    Call MD for:  redness, tenderness, or signs of infection (pain, swelling, redness, odor or green/yellow discharge around incision site)   Complete by: As directed    Call MD for:  severe uncontrolled pain   Complete by: As directed    Call MD for:  temperature >100.4   Complete by: As directed    Diet - low sodium heart healthy   Complete by: As directed    Diet Carb Modified   Complete by: As directed    Discharge instructions   Complete by: As directed    You were cared for by a hospitalist during your  hospital stay. If you have any questions about your discharge medications or the care you received while you were in the hospital after you are discharged, you can call the unit and ask to speak with the hospitalist on call if the hospitalist that took care of you is not available. Once you are discharged, your primary care physician will handle any further medical issues. Please note that NO REFILLS for any discharge medications will be authorized once you are discharged, as it is imperative that you return to your primary care physician (or establish a relationship with a primary care physician if you do not have one) for your aftercare needs so that they can reassess your need for medications and monitor your lab values.  Follow up with PCP, Cardiology within 1-2 weeks. Take all medications as prescribed. If symptoms change or worsen please return to the ED for evaluation   Discharge instructions   Complete by: As directed    This hospital stay you were diagnosed  with cellulitis of your lower extremities which is an inflammation of the soft tissues of your skin and muscles in your legs-you were treated with antibiotics IV and switch to oral tablets which she should complete and continue at discharge You have a wound on your left buttock which needs to be dressed with Santyl  as below keep a monitor on it we will get a home health aide to help you out with regards to this It would be a good idea for you to look at all of your medications carefully some of the medications you are on may need to be adjusted such as your diuretics like the Demadex  as well as Aldactone  to keep you out of heart failure You will need labs to monitor how you are doing on these medications additionally--- weigh yourself on a scale every day and if you gain more than 2 to 3 pounds in a 24-hour period time you may need to take an extra dose of the Demadex  to get fluid off and if this happens more than 1 day in a row you need to call  your primary physician and asked them further directions with regards to what to do For high-grade fever chills nausea vomiting you need to come back to the hospital   Discharge wound care:   Complete by: As directed    Daily Wound Care: Apply Santyl  to left buttock wound Q day, then cover with moist gauze and foam dressing.  Change foam dressing Q 3 days or PRN soiling   Discharge wound care:   Complete by: As directed    Wound care  Daily      Comments: Apply Santyl  to left buttock wound Q day, then cover with moist gauze and foam dressing.  Change foam dressing Q 3 days or PRN soiling  08/15/24 1049   Increase activity slowly   Complete by: As directed    Increase activity slowly   Complete by: As directed       Allergies as of 08/23/2024       Reactions   Nsaids Other (See Comments)   Hisotory of G.I. bleeds   Other Other (See Comments)   Non steroidal anti inflammatory analgesic - GI bleed        Medication List     STOP taking these medications    alprazolam  2 MG tablet Commonly known as: XANAX        TAKE these medications    acetaminophen  325 MG tablet Commonly known as: TYLENOL  Take 2 tablets (650 mg total) by mouth every 6 (six) hours as needed for mild pain (pain score 1-3) or fever (or Fever >/= 101).   albuterol  108 (90 Base) MCG/ACT inhaler Commonly known as: VENTOLIN  HFA Inhale 1 puff into the lungs in the morning and at bedtime.   carvedilol  3.125 MG tablet Commonly known as: COREG  Take 3.125 mg by mouth 2 (two) times daily with a meal.   cefadroxil  500 MG capsule Commonly known as: DURICEF Take 2 capsules (1,000 mg total) by mouth 2 (two) times daily for 3 days.   clonazePAM  2 MG tablet Commonly known as: KLONOPIN  Take 2 mg by mouth 2 (two) times daily as needed for anxiety.   diclofenac Sodium 1 % Gel Commonly known as: VOLTAREN Apply 4 g topically 4 (four) times daily as needed.   empagliflozin  25 MG Tabs tablet Commonly known as:  JARDIANCE  Take 12.5 mg by mouth daily.   guaiFENesin  600 MG 12 hr tablet Commonly known  as: MUCINEX  Take 1 tablet (600 mg total) by mouth 2 (two) times daily for 5 days.   latanoprost  0.005 % ophthalmic solution Commonly known as: XALATAN  Place 1 drop into both eyes at bedtime.   linezolid  600 MG tablet Commonly known as: ZYVOX  Take 1 tablet (600 mg total) by mouth 2 (two) times daily for 3 days.   MENTHOL-METHYL SALICYLATE EX Apply 1 Application topically 3 (three) times daily as needed.   miconazole 2 % powder Commonly known as: MICOTIN Apply 1 Application topically as needed for itching.   omeprazole 20 MG capsule Commonly known as: PRILOSEC Take 20 mg by mouth daily.   ondansetron  4 MG tablet Commonly known as: ZOFRAN  Take 1 tablet (4 mg total) by mouth every 6 (six) hours as needed for nausea.   oxyCODONE  5 MG immediate release tablet Commonly known as: Oxy IR/ROXICODONE  Take 1 tablet (5 mg total) by mouth every 6 (six) hours as needed for up to 3 days for moderate pain (pain score 4-6).   polyethylene glycol powder 17 GM/SCOOP powder Commonly known as: GLYCOLAX /MIRALAX  Take 17 g by mouth 2 (two) times daily. Dissolve 1 capful (17g) in 4-8 ounces of liquid and take by mouth twice daily.   QUEtiapine  200 MG tablet Commonly known as: SEROQUEL  Take 200 mg by mouth at bedtime as needed (for sleep).   rosuvastatin  20 MG tablet Commonly known as: CRESTOR  Take 20 mg by mouth at bedtime.   senna-docusate 8.6-50 MG tablet Commonly known as: Senokot-S Take 1 tablet by mouth at bedtime.   spironolactone  25 MG tablet Commonly known as: ALDACTONE  Take 25 mg by mouth daily.   torsemide  20 MG tablet Commonly known as: DEMADEX  Take 80 mg by mouth in the morning.   triamcinolone  0.1 % cream : eucerin Crea Apply 1 Application topically daily as needed.   WEGOVY [place] Inject 0.25 mg into the skin once a week. 0.25 mg/0.375 ml               Discharge Care  Instructions  (From admission, onward)           Start     Ordered   08/23/24 0000  Discharge wound care:       Comments: Wound care  Daily      Comments: Apply Santyl  to left buttock wound Q day, then cover with moist gauze and foam dressing.  Change foam dressing Q 3 days or PRN soiling  08/15/24 1049   08/23/24 0958   08/20/24 0000  Discharge wound care:       Comments: Daily Wound Care: Apply Santyl  to left buttock wound Q day, then cover with moist gauze and foam dressing.  Change foam dressing Q 3 days or PRN soiling   08/20/24 1226           Allergies[1]    The results of significant diagnostics from this hospitalization (including imaging, microbiology, ancillary and laboratory) are listed below for reference.    Significant Diagnostic Studies: DG CHEST PORT 1 VIEW Result Date: 08/19/2024 CLINICAL DATA:  Congestive heart failure, shortness of breath. Leg edema and cellulitis. EXAM: PORTABLE CHEST 1 VIEW COMPARISON:  08/19/2024. FINDINGS: The heart size and mediastinal contours are stable. The pulmonary vasculature is mildly distended. Lung volumes are low with atelectasis at the right lung base. No consolidation, effusion, or pneumothorax is seen. No acute osseous abnormality. IMPRESSION: Cardiomegaly with mildly distended pulmonary vasculature. Electronically Signed   By: Leita Birmingham M.D.   On: 08/19/2024  16:10   DG CHEST PORT 1 VIEW Result Date: 08/19/2024 CLINICAL DATA:  Congestive heart failure. EXAM: PORTABLE CHEST 1 VIEW COMPARISON:  08/18/2024 FINDINGS: The cardio pericardial silhouette is enlarged. There is pulmonary vascular congestion without overt pulmonary edema. No focal consolidation or overt airspace pulmonary edema. No substantial pleural effusion. No acute bony abnormality. IMPRESSION: Enlargement of the cardiopericardial silhouette with pulmonary vascular congestion. Electronically Signed   By: Camellia Candle M.D.   On: 08/19/2024 05:31   DG CHEST PORT 1  VIEW Result Date: 08/18/2024 CLINICAL DATA:  Shortness of breath. EXAM: PORTABLE CHEST 1 VIEW COMPARISON:  Radiograph 08/16/2024, CT 07/28/2024 FINDINGS: Cardiomegaly is stable. Unchanged mediastinal contours. Similar vascular congestion. No confluent opacity, large pleural effusion or pneumothorax. Stable osseous structures. IMPRESSION: Unchanged cardiomegaly and vascular congestion. Electronically Signed   By: Andrea Gasman M.D.   On: 08/18/2024 11:00   DG CHEST PORT 1 VIEW Result Date: 08/16/2024 CLINICAL DATA:  Shortness of breath. EXAM: PORTABLE CHEST 1 VIEW COMPARISON:  Chest [place]  dated 07/28/2024. FINDINGS: Cardiomegaly with mild vascular congestion slightly improved since the prior radiograph. No consolidative changes. No pleural effusion or pneumothorax. No acute osseous pathology. IMPRESSION: Cardiomegaly with mild vascular congestion. Electronically Signed   By: Vanetta Chou M.D.   On: 08/16/2024 13:33   VAS US  LOWER EXTREMITY VENOUS (DVT) Result Date: 08/15/2024  Lower Venous DVT Study Patient Name:  Christopher Riley  Date of Exam:   08/15/2024 Medical Rec #: [id number]         Accession #:    [id number] Date of Birth: 02-12-1951         Patient Gender: M Patient Age:   71 years Exam Location:  [facility] Procedure:      VAS US  LOWER EXTREMITY VENOUS (DVT) Referring Phys: SUMAYYA AMIN --------------------------------------------------------------------------------  Indications: Swelling, and Edema.  Comparison Study: Previous study of the right lower extremity on 11.12.2025. Performing Technologist: Edilia Elden Appl  Examination Guidelines: A complete evaluation includes B-mode imaging, spectral Doppler, color Doppler, and power Doppler as needed of all accessible portions of each vessel. Bilateral testing is considered an integral part of a complete examination. Limited examinations for reoccurring indications may be performed as noted. The reflux portion of the exam is  performed with the patient in reverse Trendelenburg.  +---------+---------------+---------+-----------+----------+-------------------+ RIGHT    CompressibilityPhasicitySpontaneityPropertiesThrombus Aging      +---------+---------------+---------+-----------+----------+-------------------+ CFV      Full           Yes      Yes                                      +---------+---------------+---------+-----------+----------+-------------------+ SFJ      Full           Yes      Yes                                      +---------+---------------+---------+-----------+----------+-------------------+ FV Prox  Full                                                             +---------+---------------+---------+-----------+----------+-------------------+ FV Mid  Full           No       Yes                  Limited             +---------+---------------+---------+-----------+----------+-------------------+ FV DistalFull           Yes      Yes                                      +---------+---------------+---------+-----------+----------+-------------------+ PFV      Full                                                             +---------+---------------+---------+-----------+----------+-------------------+ POP      Full           Yes      Yes                                      +---------+---------------+---------+-----------+----------+-------------------+ PTV      Full                                                             +---------+---------------+---------+-----------+----------+-------------------+ PERO                                                  Not well                                                                  visualized, patent                                                        by color.           +---------+---------------+---------+-----------+----------+-------------------+    +----+---------------+---------+-----------+----------+--------------+ LEFTCompressibilityPhasicitySpontaneityPropertiesThrombus Aging +----+---------------+---------+-----------+----------+--------------+ CFV Full           Yes      Yes                                 +----+---------------+---------+-----------+----------+--------------+ SFJ Full           Yes      Yes                                 +----+---------------+---------+-----------+----------+--------------+  Summary: RIGHT: - There is no evidence of deep vein thrombosis in the lower extremity. However, portions of this examination were limited- see technologist comments above.  - No cystic structure found in the popliteal fossa.  LEFT: - No evidence of common femoral vein obstruction.   *See table(s) above for measurements and observations. Electronically signed by Norman Serve on 08/15/2024 at 1:12:12 PM.    Final    CT Angio Aortobifemoral W and/or Wo Contrast Result Date: 08/13/2024 EXAM: CTA ABDOMEN AND PELVIS WITH CONTRAST AND RUNOFF CTA OF THE LOWER EXTREMITIES WITH CONTRAST 08/13/2024 06:26:19 AM TECHNIQUE: CTA images of the abdomen, pelvis and lower extremities with intravenous contrast. Three-dimensional MIP/volume rendered formations were performed. Automated exposure control, iterative reconstruction, and/or weight based adjustment of the mA/kV was utilized to reduce the radiation dose to as low as reasonably achievable. The arterial opacification in the lower extremities is suboptimal owing to bolus timing. COMPARISON: None available. CLINICAL HISTORY: Claudication or leg ischemia; ?right leg ischemia vs infection. FINDINGS: VASCULATURE: AORTA: The abdominal aorta demonstrates only trace calcific plaques and no other significant abnormalities; no stenosis, dissection, aneurysm or penetrating ulcer. CELIAC TRUNK: The celiac trunk branches are poorly seen due to patient motion but proximally, the vessel is widely  patent. SUPERIOR MESENTERIC ARTERY: The superior mesenteric arterial branches are poorly seen due to patient motion but proximal to the first SMA branch point, the vessel is widely patent. INFERIOR MESENTERIC ARTERY: No acute finding. No occlusion or significant stenosis. RENAL ARTERIES: Both renal arteries are single with trace calcific plaques proximally, both are widely patent, but with the hilar branches not evaluated due to motion blur. RIGHT ILIAC ARTERIES: No acute finding. No occlusion or significant stenosis. RIGHT FEMORAL ARTERIES: No acute finding. No occlusion or significant stenosis. RIGHT POPLITEAL ARTERY: No acute finding. No occlusion or significant stenosis. RIGHT CALF ARTERIES: No acute finding. No occlusion or significant stenosis. LEFT ILIAC ARTERIES: No acute finding. No occlusion or significant stenosis. LEFT FEMORAL ARTERIES: No acute finding. No occlusion or significant stenosis. LEFT POPLITEAL ARTERY: No acute finding. No occlusion or significant stenosis. LEFT CALF ARTERIES: No acute finding. No occlusion or significant stenosis. Technical note: The arterial opacification in the lower extremities is suboptimal owing to bolus timing, but no significant peripheral arterial vascular disease appears to be present. 3 vessel runoff to both feet is most likely preserved on both sides. ABDOMEN AND PELVIS: LOWER CHEST: Posterior atelectasis in the lung bases without consolidation. Moderate-sized hiatal hernia. Mild cardiomegaly. LIVER: Motion artifact. No liver mass is seen through the motion blur. GALLBLADDER AND BILE DUCTS: The gallbladder is present but not well enough seen to evaluate its wall or lumen due to motion artifact. No biliary ductal dilatation. SPLEEN: Motion artifact. No splenic mass is seen through the motion artifact. PANCREAS: Motion artifact. No pancreatic mass is seen through the motion artifact. ADRENAL GLANDS: Motion artifact. No adrenal mass is seen through the motion  artifact. KIDNEYS, URETERS AND BLADDER: Motion artifact. No focal renal abnormality is seen through the motion artifact. No stones in the kidneys or ureters. No hydronephrosis. No evidence of perinephric or periureteral stranding. Urinary bladder is unremarkable. Penile implant pelvic fluid reservoir anterior to the bladder. GI AND Bowel: Stomach and duodenal sweep demonstrate no acute abnormality. There is no bowel obstruction. No abnormal bowel wall thickening or distension. The appendix is normal. No bowel inflammation is visible through the motion artifacts. REPRODUCTIVE: No prostatomegaly. Both testicles are in the scrotal sac. There is a penile implant  device. PERITONEUM AND RETROPERITONEUM: No ascites or free air. No free hemorrhage. LYMPH NODES: No evidence of lymphadenopathy within the abdomen and pelvis, but there are bilateral enlarged inguinal chain nodes, largest on the right is 1.7 cm short axis, largest on the left is 2 cm. BONES AND SOFT TISSUES: There is advanced degenerative change of the spine. Mild hip arthrosis. Bridging Osteophytes both SI joints. Multilevel acquired lumbar foraminal stenosis. Right knee joint arthroplasty with spray artifacts. Advanced arthrosis in the left knee. Left patella is not seen. Stranding changes are noted subcutaneously in the left and right lateral abdominal wall, probably due to third spaced fluid. There is a 7 cm umbilical fat hernia through a 4 cm wide anterior wall defect, and there are bilateral small inguinal fat hernias. There is no incarcerated hernia. In the lower extremities, there is generalized edema of the right greater than left lower extremities without a focal fluid collection. This could be cellulitis or congestive edema. No soft tissue gas is seen. No acute abnormality of the musculature and deep spaces in the lower extremities. IMPRESSION: 1. Allowing for suboptimal peripheral vascular enhancement, No occlusion or hemodynamically significant  stenosis of the arterial system of the abdomen, pelvis, or bilateral lower extremities is suspected. No abdominal aortic aneurysm. No aortic dissection. 2. Generalized edema of the right greater than left lower extremities without a focal fluid collection, possibly representing cellulitis or congestive edema. No soft tissue gas. 3. Bilateral enlarged inguinal chain nodes, largest on the right is 1.7 cm short axis, largest on the left is 2 cm. 4. Moderate size  hiatal hernia. Moderate-sized umbilical fat hernia. 5. Advanced degenerative changes of the spine and left knee. 6. Remaining findings described .above. Electronically signed by: Francis Quam MD 08/13/2024 07:13 AM EST RP Workstation: [id number]   ECHOCARDIOGRAM COMPLETE Result Date: 07/29/2024    ECHOCARDIOGRAM REPORT   Patient Name:   Christopher Riley Date of Exam: 07/29/2024 Medical Rec #:  [id number]        Height:       74.0 in Accession #:    [id number]       Weight:       375.0 lb Date of Birth:  1950/11/06        BSA:          2.839 m Patient Age:    73 years         BP:           125/59 mmHg Patient Gender: M                HR:           79 bpm. Exam Location:  Inpatient Procedure: 2D Echo, Color Doppler, Cardiac Doppler and Intracardiac            Opacification Agent (Both Spectral and Color Flow Doppler were            utilized during procedure). Indications:    CHF I50.21  History:        Patient has no prior history of Echocardiogram examinations.                 CHF; Risk Factors:Hypertension, Diabetes and Dyslipidemia.  Sonographer:    Tinnie Gosling RDCS Referring Phys: [id number] ROBERT DORRELL IMPRESSIONS  1. Very limited study due to poor sound transmission and patient's body habitus. Commentary on the patient's valvular and right ventricular function cannot be made by this current study.  2. Left ventricular ejection fraction, by  estimation, is 55 to 60%. The left ventricle has normal function. The left ventricle has no regional wall motion  abnormalities. There is moderate concentric left ventricular hypertrophy. Left ventricular diastolic function could not be evaluated.  3. Right ventricular systolic function was not well visualized. The right ventricular size is not well visualized. Tricuspid regurgitation signal is inadequate for assessing PA pressure.  4. The mitral valve was poorly visualized prohbiting adequate assessment.  5. The tricuspid valve was poorly visualized prohibiting adequate assessment.  6. The aortic valve is tricuspid, but aortic regurgitation and stenosis could not be assessed. Comparison(s): No prior Echocardiogram. Conclusion(s)/Recommendation(s): LV systolci function is clearly preserved but this study is otherwise non-diagnostic for any right ventricular or valvular pathology. Consider obtaining a cardiac MRI or TEE if information on the right ventricle or valves  is required. FINDINGS  Left Ventricle: Left ventricular ejection fraction, by estimation, is 55 to 60%. The left ventricle has normal function. The left ventricle has no regional wall motion abnormalities. The left ventricular internal cavity size was normal in size. There is  moderate concentric left ventricular hypertrophy. Left ventricular diastolic function could not be evaluated. Right Ventricle: The right ventricular size is not well visualized. Right vetricular wall thickness was not well visualized. Right ventricular systolic function was not well visualized. Tricuspid regurgitation signal is inadequate for assessing PA pressure. Left Atrium: Left atrial size was not well visualized. Right Atrium: Right atrial size was not well visualized. Pericardium: There is no evidence of pericardial effusion. Mitral Valve: The mitral valve was poorly visualized prohbiting adequate assessment. Tricuspid Valve: The tricuspid valve was poorly visualized prohibiting adequate assessment. Aortic Valve: The aortic valve is tricuspid, but aortic regurgitation and stenosis  could not be assessed. Pulmonic Valve: The pulmonic valve was not well visualized. Pulmonic valve regurgitation is not visualized. No evidence of pulmonic stenosis. Aorta: The aortic root and ascending aorta are structurally normal, with no evidence of dilitation. Venous: The inferior vena cava was not well visualized. IAS/Shunts: The interatrial septum was not well visualized.  LEFT VENTRICLE PLAX 2D LVIDd:         4.30 cm   Diastology LVIDs:         3.00 cm   LV e' medial:    11.40 cm/s LV PW:         1.50 cm   LV E/e' medial:  5.8 LV IVS:        1.40 cm   LV e' lateral:   6.64 cm/s LVOT diam:     2.10 cm   LV E/e' lateral: 10.0 LVOT Area:     3.46 cm  IVC IVC diam: 1.40 cm LEFT ATRIUM         Index LA diam:    4.70 cm 1.66 cm/m   AORTA Ao Root diam: 3.20 cm Ao Asc diam:  3.20 cm MITRAL VALVE MV Area (PHT): 5.24 cm    SHUNTS MV E velocity: 66.40 cm/s  Systemic Diam: 2.10 cm MV A velocity: 66.80 cm/s MV E/A ratio:  0.99 Georganna Archer Electronically signed by Georganna Archer Signature Date/Time: 07/29/2024/2:18:37 PM    Final    CT Angio Chest PE W and/or Wo Contrast Result Date: 07/29/2024 EXAM: CTA CHEST 07/29/2024 12:26:08 AM TECHNIQUE: CTA of the chest was performed without and with the administration of 75 mL of intravenous iohexol  (OMNIPAQUE ) 350 MG/ML injection. Multiplanar reformatted images are provided for review. MIP images are provided for review. Automated exposure control, iterative reconstruction, and/or weight based  adjustment of the mA/kV was utilized to reduce the radiation dose to as low as reasonably achievable. COMPARISON: None available. CLINICAL HISTORY: Pulmonary embolism (PE) suspected, low to intermediate prob, positive D-dimer. FINDINGS: PULMONARY ARTERIES: Pulmonary arteries are adequately opacified for evaluation. No acute pulmonary embolus. Main pulmonary artery is normal in caliber. MEDIASTINUM: The heart and pericardium demonstrate no acute abnormality. There is no acute  abnormality of the thoracic aorta. LYMPH NODES: No mediastinal, hilar or axillary lymphadenopathy. LUNGS AND PLEURA: Dependent ground glass opacities, likely dependent atelectasis. No confluent opacities. No effusions. No pneumothorax. UPPER ABDOMEN: Limited images of the upper abdomen demonstrate a moderate-sized hiatal hernia. SOFT TISSUES AND BONES: No acute bone or soft tissue abnormality. IMPRESSION: 1. No pulmonary embolism. 2. Moderate-sized hiatal hernia. 3. No acute cardiopulmonary disease. Electronically signed by: Franky Crease MD 07/29/2024 12:29 AM EST RP Workstation: [id number]   DG Chest 2 View Result Date: 07/28/2024 CLINICAL DATA:  Shortness of breath EXAM: CHEST - 2 VIEW COMPARISON:  07/21/2024, 09/28/2012 FINDINGS: Hypoventilatory changes. Cardiomegaly with mild central congestion. Prominent appearing central pulmonary vessels. No consolidation or pneumothorax. Biapical pleural thickening is chronic. IMPRESSION: Cardiomegaly with mild central congestion. Prominent appearing central pulmonary vessels, possible pulmonary arterial hypertension. Electronically Signed   By: Luke Bun M.D.   On: 07/28/2024 16:18    Microbiology: Recent Results (from the past 240 hours)  Resp panel by RT-PCR (RSV, Flu A&B, Covid) Anterior Nasal Swab     Status: None   Collection Time: 08/13/24 10:49 PM   Specimen: Anterior Nasal Swab  Result Value Ref Range Status   SARS Coronavirus 2 by RT PCR NEGATIVE NEGATIVE Final    Comment: (NOTE) SARS-CoV-2 target nucleic acids are NOT DETECTED.  The SARS-CoV-2 RNA is generally detectable in upper respiratory specimens during the acute phase of infection. The lowest concentration of SARS-CoV-2 viral copies this assay can detect is 138 copies/mL. A negative result does not preclude SARS-Cov-2 infection and should not be used as the sole basis for treatment or other patient management decisions. A negative result may occur with  improper specimen  collection/handling, submission of specimen other than nasopharyngeal swab, presence of viral mutation(s) within the areas targeted by this assay, and inadequate number of viral copies(<138 copies/mL). A negative result must be combined with clinical observations, patient history, and epidemiological information. The expected result is Negative.  Fact Sheet for Patients:  bloggercourse.com  Fact Sheet for Healthcare Providers:  seriousbroker.it  This test is no t yet approved or cleared by the [place]  FDA and  has been authorized for detection and/or diagnosis of SARS-CoV-2 by FDA under an Emergency Use Authorization (EUA). This EUA will remain  in effect (meaning this test can be used) for the duration of the COVID-19 declaration under Section 564(b)(1) of the Act, 21 U.S.C.section 360bbb-3(b)(1), unless the authorization is terminated  or revoked sooner.       Influenza A by PCR NEGATIVE NEGATIVE Final   Influenza B by PCR NEGATIVE NEGATIVE Final    Comment: (NOTE) The Xpert Xpress SARS-CoV-2/FLU/RSV plus assay is intended as an aid in the diagnosis of influenza from Nasopharyngeal swab specimens and should not be used as a sole basis for treatment. Nasal washings and aspirates are unacceptable for Xpert Xpress SARS-CoV-2/FLU/RSV testing.  Fact Sheet for Patients: bloggercourse.com  Fact Sheet for Healthcare Providers: seriousbroker.it  This test is not yet approved or cleared by the [place]  FDA and has been authorized for detection and/or diagnosis of SARS-CoV-2 by FDA under  an Emergency Use Authorization (EUA). This EUA will remain in effect (meaning this test can be used) for the duration of the COVID-19 declaration under Section 564(b)(1) of the Act, 21 U.S.C. section 360bbb-3(b)(1), unless the authorization is terminated or revoked.     Resp Syncytial  Virus by PCR NEGATIVE NEGATIVE Final    Comment: (NOTE) Fact Sheet for Patients: bloggercourse.com  Fact Sheet for Healthcare Providers: seriousbroker.it  This test is not yet approved or cleared by the [place]  FDA and has been authorized for detection and/or diagnosis of SARS-CoV-2 by FDA under an Emergency Use Authorization (EUA). This EUA will remain in effect (meaning this test can be used) for the duration of the COVID-19 declaration under Section 564(b)(1) of the Act, 21 U.S.C. section 360bbb-3(b)(1), unless the authorization is terminated or revoked.  Performed at [facility], [address] [place], [place] [address]      Labs: Basic Metabolic Panel: Recent Labs  Lab 08/17/24 0506 08/18/24 0438 08/19/24 0453 08/20/24 0413  NA 138 139 139 138  K 4.4 4.6 5.1 4.7  CL 97* 99 99 98  CO2 29 31 34* 32  GLUCOSE 109* 107* 96 102*  BUN 18 18 18 22   CREATININE 1.23 1.09 1.17 1.15  CALCIUM  8.9 8.9 9.1 9.2  MG 2.4 2.6* 2.7* 2.9*  PHOS 3.4 3.3 3.6 3.4   Liver Function Tests: Recent Labs  Lab 08/17/24 0506 08/18/24 0438 08/19/24 0453 08/20/24 0413  AST 25 61* 42* 35  ALT 15 20 18 17   ALKPHOS 71 75 65 69  BILITOT 0.3 0.3 0.3 0.3  PROT 7.5 7.3 7.1 7.6  ALBUMIN 3.4* 3.3* 3.3* 3.4*   No results for input(s): LIPASE, AMYLASE in the last 168 hours. No results for input(s): AMMONIA in the last 168 hours. CBC: Recent Labs  Lab 08/17/24 0506 08/18/24 0438 08/19/24 0453 08/20/24 0413  WBC 9.5 9.1 9.4 9.4  NEUTROABS 6.9 6.4 5.9 6.5  HGB 13.0 11.7* 12.2* 12.6*  HCT 40.3 38.9* 40.2 42.1  MCV 83.4 85.5 86.3 85.9  PLT 262 255 270 285   Cardiac Enzymes: No results for input(s): CKTOTAL, CKMB, CKMBINDEX, TROPONINI in the last 168 hours. BNP: BNP (last 3 results) No results for input(s): BNP in the last 8760 hours.  ProBNP (last 3 results) Recent Labs    07/21/24 0039  07/28/24 1525 08/19/24 0453  PROBNP 351.0* 180.0 73.9    CBG: Recent Labs  Lab 08/22/24 0754 08/22/24 1221 08/22/24 1715 08/22/24 2057 08/23/24 0755  GLUCAP 94 116* 99 106* 84    Signed:  Colen Grimes MD   [facility] 08/23/2024, 9:58 AM      [1]  Allergies Allergen Reactions   Nsaids Other (See Comments)    Hisotory of G.I. bleeds   Other Other (See Comments)    Non steroidal anti inflammatory analgesic - GI bleed

## 2024-08-23 NOTE — TOC Transition Note (Signed)
 Transition of Care [facility]) - Discharge Note   Patient Details  Name: Christopher Riley MRN: [id number] Date of Birth: [birth year]  [facility]) CM/SW Contact:  [name], RN Phone Number: 08/23/2024, 12:15 PM   Clinical Narrative:   Met with patient at bedside to confirm he is declining short term rehab/SNF at [facility] in [place], pt confirmed he is declining, states he has a terminally ill daughter and doesn't want to be far away, agreeable to [facility] services. MD order for [facility] PT/OT/HHA/RN, Hedda accepted for [facility] services, added to AVS.  Call received from [facility] she received CM voicemails regarding patient opted for [facility] services. No furtther CM needs identified at this time.     Final next level of care: Home/Self Care Barriers to Discharge: Barriers Resolved   Patient Goals and CMS Choice Patient states their goals for this hospitalization and ongoing recovery are:: return home CMS Medicare.gov Compare Post Acute Care list provided to:: Patient Choice offered to / list presented to : Patient [place] ownership interest in [facility].provided to:: Patient    Discharge Placement                    Patient and family notified of of transfer: 08/23/24  Discharge Plan and Services Additional resources added to the After Visit Summary for                            [facility] Arranged: PT, OT, Nurse's Aide, RN HH Agency: [facility]        Social Drivers of Health (SDOH) Interventions SDOH Screenings   Food Insecurity: No Food Insecurity (08/13/2024)  Housing: Low Risk (08/13/2024)  Transportation Needs: Unmet Transportation Needs (08/13/2024)  Utilities: Not At Risk (08/13/2024)  Social Connections: Socially Isolated (08/13/2024)  Tobacco Use: Low Risk (08/13/2024)     Readmission Risk Interventions     No data to display

## 2024-08-23 NOTE — Progress Notes (Signed)
 Patient ambulated from bed to chair using just his personal walker. Patient got dressed with assistance. Discharge orders were reviewed with patient and he had no questions. Patient waits for son to come pick him up and medications to be delivered to room.

## 2024-08-23 NOTE — Plan of Care (Signed)
   Problem: Education: Goal: Ability to describe self-care measures that may prevent or decrease complications (Diabetes Survival Skills Education) will improve Outcome: Progressing Goal: Individualized Educational Video(s) Outcome: Progressing   Problem: Coping: Goal: Ability to adjust to condition or change in health will improve Outcome: Progressing

## 2024-08-23 NOTE — Progress Notes (Signed)
" °   08/23/24 0000  BiPAP/CPAP/SIPAP  BiPAP/CPAP/SIPAP Pt Type Adult  BiPAP/CPAP/SIPAP DREAMSTATIOND  Mask Type Full face mask  Dentures removed? Not applicable  Mask Size Large  Respiratory Rate 18 breaths/min  Patient Home Machine No  Patient Home Mask No  Patient Home Tubing No  Auto Titrate Yes  Minimum cmH2O 5 cmH2O  Maximum cmH2O 20 cmH2O  Device Plugged into [employer] Power Outlet Yes    "
